# Patient Record
Sex: Female | Born: 1945
Health system: Southern US, Community
[De-identification: ages and names within clinical notes are randomized; demographics above are authoritative.]

## PROBLEM LIST (undated history)

## (undated) DIAGNOSIS — F1010 Alcohol abuse, uncomplicated: Secondary | ICD-10-CM

## (undated) DIAGNOSIS — K7460 Unspecified cirrhosis of liver: Secondary | ICD-10-CM

## (undated) DIAGNOSIS — H35039 Hypertensive retinopathy, unspecified eye: Secondary | ICD-10-CM

## (undated) DIAGNOSIS — K259 Gastric ulcer, unspecified as acute or chronic, without hemorrhage or perforation: Secondary | ICD-10-CM

## (undated) DIAGNOSIS — I739 Peripheral vascular disease, unspecified: Secondary | ICD-10-CM

## (undated) DIAGNOSIS — Z5189 Encounter for other specified aftercare: Secondary | ICD-10-CM

## (undated) DIAGNOSIS — R011 Cardiac murmur, unspecified: Secondary | ICD-10-CM

## (undated) DIAGNOSIS — I10 Essential (primary) hypertension: Secondary | ICD-10-CM

## (undated) HISTORY — DX: Encounter for other specified aftercare: Z51.89

## (undated) HISTORY — PX: CATARACT EXTRACTION: SUR2

## (undated) HISTORY — DX: Unspecified cirrhosis of liver: K74.60

## (undated) HISTORY — DX: Gastric ulcer, unspecified as acute or chronic, without hemorrhage or perforation: K25.9

## (undated) HISTORY — DX: Essential (primary) hypertension: I10

## (undated) HISTORY — DX: Alcohol abuse, uncomplicated: F10.10

## (undated) HISTORY — PX: EYE SURGERY: SHX253

## (undated) HISTORY — DX: Hypertensive retinopathy, unspecified eye: H35.039

## (undated) HISTORY — PX: ABDOMINAL HYSTERECTOMY: SHX81

## (undated) HISTORY — PX: OOPHORECTOMY: SHX86

## (undated) HISTORY — DX: Peripheral vascular disease, unspecified: I73.9

## (undated) HISTORY — DX: Cardiac murmur, unspecified: R01.1

## (undated) HISTORY — PX: BREAST LUMPECTOMY: SHX2

---

## 1969-01-26 HISTORY — PX: BREAST EXCISIONAL BIOPSY: SUR124

## 1999-02-24 ENCOUNTER — Encounter: Admission: RE | Admit: 1999-02-24 | Discharge: 1999-02-24 | Payer: Self-pay | Admitting: Internal Medicine

## 1999-03-25 ENCOUNTER — Encounter: Admission: RE | Admit: 1999-03-25 | Discharge: 1999-03-25 | Payer: Self-pay | Admitting: Internal Medicine

## 1999-04-23 ENCOUNTER — Ambulatory Visit (HOSPITAL_COMMUNITY): Admission: RE | Admit: 1999-04-23 | Discharge: 1999-04-23 | Payer: Self-pay | Admitting: Internal Medicine

## 2000-03-01 ENCOUNTER — Encounter: Payer: Self-pay | Admitting: Internal Medicine

## 2000-03-01 ENCOUNTER — Inpatient Hospital Stay (HOSPITAL_COMMUNITY): Admission: EM | Admit: 2000-03-01 | Discharge: 2000-03-03 | Payer: Self-pay | Admitting: Emergency Medicine

## 2000-03-01 ENCOUNTER — Encounter: Payer: Self-pay | Admitting: Emergency Medicine

## 2000-03-03 ENCOUNTER — Encounter: Payer: Self-pay | Admitting: Internal Medicine

## 2000-03-08 ENCOUNTER — Encounter: Admission: RE | Admit: 2000-03-08 | Discharge: 2000-03-08 | Payer: Self-pay | Admitting: Internal Medicine

## 2003-05-23 ENCOUNTER — Emergency Department (HOSPITAL_COMMUNITY): Admission: EM | Admit: 2003-05-23 | Discharge: 2003-05-23 | Payer: Self-pay | Admitting: Family Medicine

## 2003-11-13 ENCOUNTER — Ambulatory Visit: Payer: Self-pay | Admitting: Internal Medicine

## 2004-01-16 ENCOUNTER — Ambulatory Visit: Payer: Self-pay | Admitting: Internal Medicine

## 2012-02-10 ENCOUNTER — Emergency Department (HOSPITAL_COMMUNITY)
Admission: EM | Admit: 2012-02-10 | Discharge: 2012-02-10 | Disposition: A | Discharge status: Home / Self Care | Payer: Medicare Other | Attending: Emergency Medicine | Admitting: Emergency Medicine

## 2012-02-10 ENCOUNTER — Encounter (HOSPITAL_COMMUNITY): Payer: Self-pay | Admitting: Emergency Medicine

## 2012-02-10 DIAGNOSIS — I10 Essential (primary) hypertension: Secondary | ICD-10-CM | POA: Insufficient documentation

## 2012-02-10 DIAGNOSIS — F17200 Nicotine dependence, unspecified, uncomplicated: Secondary | ICD-10-CM | POA: Insufficient documentation

## 2012-02-10 DIAGNOSIS — Z79899 Other long term (current) drug therapy: Secondary | ICD-10-CM | POA: Insufficient documentation

## 2012-02-10 DIAGNOSIS — R040 Epistaxis: Secondary | ICD-10-CM | POA: Insufficient documentation

## 2012-02-10 NOTE — ED Notes (Signed)
Pt states that her nose has been bleeding off and on since last night.  States she has blood clots that have been coming out.  Moderately sized blood clots.  No hx of epistaxis.  Family member states, "it's been running like a faucet."

## 2012-02-10 NOTE — ED Provider Notes (Signed)
History     CSN: [id number]  Arrival date & time 02/10/12  1821   First MD Initiated Contact with Patient 02/10/12 1845      Chief Complaint  Patient presents with  . Epistaxis    (Consider location/radiation/quality/duration/timing/severity/associated sxs/prior treatment) Patient is a 67 y.o. female presenting with nosebleeds. The history is provided by the patient.  Epistaxis   She noted onset yesterday of intermittent right-sided nosebleeds. She denies any head trauma and she's not taking any anticoagulants. However, she does admit to using Goody powder frequently. She has not had previous problems with nosebleeds.  History reviewed. No pertinent past medical history.  Past Surgical History  Procedure Date  . Breast lumpectomy     History reviewed. No pertinent family history.  History  Substance Use Topics  . Smoking status: Current Every Day Smoker -- 0.5 packs/day    Types: Cigarettes  . Smokeless tobacco: Not on file  . Alcohol Use: Yes    OB History    Grav Para Term Preterm Abortions TAB SAB Ect Mult Living                  Review of Systems  HENT: Positive for nosebleeds.   All other systems reviewed and are negative.    Allergies  Review of patient's allergies indicates no known allergies.  Home Medications   Current Outpatient Rx  Name  Route  Sig  Dispense  Refill  . GOODY HEADACHE PO   Oral   Take 1 packet by mouth daily as needed. For pain.         . IBUPROFEN 200 MG PO TABS   Oral   Take 400 mg by mouth every 8 (eight) hours as needed. For pain.           BP 180/112  Pulse 71  SpO2 100%  Physical Exam  Nursing note and vitals reviewed.  67 year old female, resting comfortably and in no acute distress. Vital signs are significant for hypertension with blood pressure 180/112. Oxygen saturation is 100%, which is normal. Head is normocephalic and atraumatic. PERRLA, EOMI. Oropharynx is clear. Bleeding site is seen on the right  side of the nasal septum near the nare. Neck is nontender and supple without adenopathy or JVD. Back is nontender and there is no CVA tenderness. Lungs are clear without rales, wheezes, or rhonchi. Chest is nontender. Heart has regular rate and rhythm without murmur. Abdomen is soft, flat, nontender without masses or hepatosplenomegaly and peristalsis is normoactive. Extremities have no cyanosis or edema, full range of motion is present. Skin is warm and dry without rash. Neurologic: Mental status is normal, cranial nerves are intact, there are no motor or sensory deficits.  ED Course  EPISTAXIS MANAGEMENT Date/Time: 02/10/2012 6:57 PM Performed by: Dione Booze Authorized by: Preston Fleeting, Sandy Haye Consent: Verbal consent obtained. Written consent not obtained. Risks and benefits: risks, benefits and alternatives were discussed Consent given by: patient Patient understanding: patient states understanding of the procedure being performed Patient consent: the patient's understanding of the procedure matches consent given Procedure consent: procedure consent matches procedure scheduled Relevant documents: relevant documents present and verified Site marked: the operative site was marked Required items: required blood products, implants, devices, and special equipment available Patient identity confirmed: verbally with patient and arm band Time out: Immediately prior to procedure a "time out" was called to verify the correct patient, procedure, equipment, support staff and site/side marked as required. Patient sedated: no Treatment site: right anterior  Repair method: 5.5 cm Rapid Rhino. Post-procedure assessment: bleeding stopped Treatment complexity: simple Patient tolerance: Patient tolerated the procedure well with no immediate complications.   (including critical care time)    1. Anterior epistaxis   2. Hypertension       MDM  Epistaxis secondary to combination of aspirin use and  hypertension. After placement of Rapid Rhino, she was observed for approximately 90 minutes with no recurrence of bleeding. She'll be discharged and is instructed to have the Rapid Rhino removed in 2 days. She is to monitor her blood pressure as an outpatient and institute treatment if it stays persistently elevated.        Dione Booze, MD 02/10/12 2007

## 2012-02-10 NOTE — ED Notes (Signed)
Family at bedside. 

## 2012-02-12 ENCOUNTER — Emergency Department (HOSPITAL_COMMUNITY)
Admission: EM | Admit: 2012-02-12 | Discharge: 2012-02-12 | Disposition: A | Discharge status: Home / Self Care | Payer: Medicare Other | Attending: Emergency Medicine | Admitting: Emergency Medicine

## 2012-02-12 ENCOUNTER — Encounter (HOSPITAL_COMMUNITY): Payer: Self-pay | Admitting: *Deleted

## 2012-02-12 ENCOUNTER — Encounter (HOSPITAL_COMMUNITY): Payer: Self-pay

## 2012-02-12 ENCOUNTER — Emergency Department (INDEPENDENT_AMBULATORY_CARE_PROVIDER_SITE_OTHER)
Admission: EM | Admit: 2012-02-12 | Discharge: 2012-02-12 | Disposition: A | Discharge status: Nursing Home (Includes ICF) | Payer: Medicare Other | Source: Home / Self Care | Attending: Family Medicine | Admitting: Family Medicine

## 2012-02-12 DIAGNOSIS — F411 Generalized anxiety disorder: Secondary | ICD-10-CM | POA: Insufficient documentation

## 2012-02-12 DIAGNOSIS — I10 Essential (primary) hypertension: Secondary | ICD-10-CM | POA: Insufficient documentation

## 2012-02-12 DIAGNOSIS — R040 Epistaxis: Secondary | ICD-10-CM

## 2012-02-12 DIAGNOSIS — F17200 Nicotine dependence, unspecified, uncomplicated: Secondary | ICD-10-CM | POA: Insufficient documentation

## 2012-02-12 MED ORDER — CLONIDINE HCL 0.1 MG PO TABS
ORAL_TABLET | ORAL | Status: AC
  Filled 2012-02-12: qty 2

## 2012-02-12 MED ORDER — CLONIDINE HCL 0.1 MG PO TABS
0.2000 mg | ORAL_TABLET | Freq: Once | ORAL | Status: AC
  Administered 2012-02-12: 0.2 mg via ORAL

## 2012-02-12 MED ORDER — OXYMETAZOLINE HCL 0.05 % NA SOLN
1.0000 | Freq: Once | NASAL | Status: AC
  Administered 2012-02-12: 1 via NASAL
  Filled 2012-02-12: qty 15

## 2012-02-12 MED ORDER — CLONIDINE HCL 0.1 MG PO TABS: 0.2000 mg | ORAL_TABLET | Freq: Once | ORAL | Status: DC

## 2012-02-12 NOTE — ED Provider Notes (Signed)
History     CSN: [id number]  Arrival date & time 02/12/12  1402   First MD Initiated Contact with Patient 02/12/12 1421      Chief Complaint  Patient presents with  . Epistaxis    (Consider location/radiation/quality/duration/timing/severity/associated sxs/prior treatment) Patient is a 67 y.o. female presenting with nosebleeds. The history is provided by the patient and a relative.  Epistaxis  This is a new problem. Episode onset: onset on1/14 ,right nares bleeding, [facility] and bleeding, rhino rocket placed, no bp attention., here for re-eval. The problem has been gradually improving. The bleeding has been from the right nare. She has tried a nasal tampon for the symptoms. The treatment provided significant relief.    History reviewed. No pertinent past medical history.  Past Surgical History  Procedure Date  . Breast lumpectomy     Family History  Problem Relation Age of Onset  . Cancer Mother   . Heart attack Father     History  Substance Use Topics  . Smoking status: Current Every Day Smoker -- 0.2 packs/day    Types: Cigarettes  . Smokeless tobacco: Not on file  . Alcohol Use: 1.8 oz/week    3 Shots of liquor per week    OB History    Grav Para Term Preterm Abortions TAB SAB Ect Mult Living                  Review of Systems  Constitutional: Negative.   HENT: Positive for nosebleeds. Negative for congestion, rhinorrhea, postnasal drip and ear discharge.     Allergies  Review of patient's allergies indicates no known allergies.  Home Medications   Current Outpatient Rx  Name  Route  Sig  Dispense  Refill  . IBUPROFEN 200 MG PO TABS   Oral   Take 400 mg by mouth every 8 (eight) hours as needed. For pain.           BP 177/100  Pulse 62  Temp 99.2 F (37.3 C) (Oral)  Resp 20  SpO2 98%  Physical Exam  Nursing note and vitals reviewed. Constitutional: She is oriented to person, place, and time. She appears well-developed  and well-nourished.  HENT:  Head: Normocephalic.  Right Ear: External ear normal.  Left Ear: External ear normal.  Nose: Epistaxis is observed.    Mouth/Throat: Oropharynx is clear and moist.  Eyes: Conjunctivae normal are normal. Pupils are equal, round, and reactive to light.  Neck: Normal range of motion. Neck supple.  Cardiovascular: Normal rate, regular rhythm and normal heart sounds.   Musculoskeletal: She exhibits no edema.  Neurological: She is alert and oriented to person, place, and time.  Skin: Skin is warm and dry.    ED Course  Procedures (including critical care time)  Labs Reviewed - No data to display No results found.   1. Right-sided epistaxis   2. Hypertension complications       MDM          Linna Hoff, MD 02/13/12 1733

## 2012-02-12 NOTE — ED Notes (Signed)
Pt. Reports nose bleeds, was seen at [facility] and had nose packed. Went to urgent care today and had packing removed, urgent care gave her "some medicine" for BP and sent her here for eval of high BP.

## 2012-02-12 NOTE — ED Notes (Signed)
Pt. Had a nose bleed this Wednesday,  Went to [facility] had rt. Nares packed.  It was removed today at out urgent care. No active bleeding noted. Pt. Was sent to [place] from urgent care due to her BP being elevated.  Presently her BP is 146/88

## 2012-02-12 NOTE — ED Notes (Addendum)
Pt. had nasal balloon inserted at [facility] ED on 1/15.  No further bleeding but c/o discomfort. Pt.'s BP elevated today and on  1/15.  No hx. HTN.  Was not started on any BP medication.

## 2012-02-12 NOTE — ED Provider Notes (Signed)
History     CSN: [id number]  Arrival date & time 02/12/12  1647   First MD Initiated Contact with Patient 02/12/12 2018      Chief Complaint  Patient presents with  . Hypertension    (Consider location/radiation/quality/duration/timing/severity/associated sxs/prior treatment) HPI  Pt to the ED sent by [facility] for elevated BP. Her BP at discharge from the Urgent Care was 177/100. She  went to the Urgent Care to have her nasal packaging removed. She has been seen in the ED for nose  bleed on the 15th  And was instructed to have it taken out. She is completely asymptomatic. She  admits to being very anxious about having the packing place because it hurt so much to put in. She  denies having a history of high blood pressure and says she is over all in good health. No chest pain,  no SOB, no abdominal pains or change in vision. nad vss   History reviewed. No pertinent past medical history.  Past Surgical History  Procedure Date  . Breast lumpectomy     Family History  Problem Relation Age of Onset  . Cancer Mother   . Heart attack Father     History  Substance Use Topics  . Smoking status: Current Every Day Smoker -- 0.2 packs/day    Types: Cigarettes  . Smokeless tobacco: Not on file  . Alcohol Use: 1.8 oz/week    3 Shots of liquor per week    OB History    Grav Para Term Preterm Abortions TAB SAB Ect Mult Living                  Review of Systems   Review of Systems  Gen: no weight loss, fevers, chills, night sweats  Eyes: no discharge or drainage, no occular pain or visual changes  Nose: no epistaxis or rhinorrhea  Mouth: no dental pain, no sore throat  Neck: no neck pain  Lungs:No wheezing, coughing or hemoptysis CV: no chest pain, palpitations, dependent edema or orthopnea, +hypertension  Abd: no abdominal pain, nausea, vomiting  GU: no dysuria or gross hematuria  MSK:  No abnormalities  Neuro: no headache, no focal neurologic deficits  Skin: no  abnormalities Psyche: negative.    Allergies  Review of patient's allergies indicates no known allergies.  Home Medications   Current Outpatient Rx  Name  Route  Sig  Dispense  Refill  . IBUPROFEN 200 MG PO TABS   Oral   Take 400 mg by mouth every 8 (eight) hours as needed. For pain.           BP 135/85  Pulse 51  Temp 97.1 F (36.2 C) (Oral)  Resp 18  SpO2 98%  Physical Exam  Nursing note and vitals reviewed. Constitutional: She appears well-developed and well-nourished. No distress.  HENT:  Head: Normocephalic and atraumatic.  Eyes: Pupils are equal, round, and reactive to light.  Neck: Normal range of motion. Neck supple.  Cardiovascular: Normal rate and regular rhythm.   Pulmonary/Chest: Effort normal.  Abdominal: Soft.  Neurological: She is alert.  Skin: Skin is warm and dry.    ED Course  Procedures (including critical care time)  Labs Reviewed - No data to display No results found.   1. Epistaxis   2. Hypertension       MDM  Discussed patient with Dr. Ignacia Palma. Pt is completely asymptomatic and BP in triage is 146/88 and throughout stay stayed stable.  No indication to start  therapy at this time. No work-up needed as patient is asymptomatic.   Pt asked to take her BP twice a day and keep record of them to show her PCP during a followup appt she is to schedule in the next couple of days.  Pt has been advised of the symptoms that warrant their return to the ED. Patient has voiced understanding and has agreed to follow-up with the PCP or specialist.        Kylie Matas, PA 02/12/12 2316

## 2012-02-13 NOTE — ED Provider Notes (Signed)
Medical screening examination/treatment/procedure(s) were performed by non-physician practitioner and as supervising physician I was immediately available for consultation/collaboration.   Carleene Cooper III, MD 02/13/12 [phone]

## 2015-04-16 ENCOUNTER — Inpatient Hospital Stay (HOSPITAL_COMMUNITY)
Admission: EM | Admit: 2015-04-16 | Discharge: 2015-04-19 | DRG: 378 | Disposition: A | Discharge status: Home Health | Payer: Medicare Other | Attending: Internal Medicine | Admitting: Internal Medicine

## 2015-04-16 ENCOUNTER — Encounter (HOSPITAL_COMMUNITY): Payer: Self-pay | Admitting: Emergency Medicine

## 2015-04-16 ENCOUNTER — Inpatient Hospital Stay (HOSPITAL_COMMUNITY): Payer: Medicare Other

## 2015-04-16 DIAGNOSIS — F10239 Alcohol dependence with withdrawal, unspecified: Secondary | ICD-10-CM | POA: Diagnosis present

## 2015-04-16 DIAGNOSIS — Z8249 Family history of ischemic heart disease and other diseases of the circulatory system: Secondary | ICD-10-CM | POA: Diagnosis not present

## 2015-04-16 DIAGNOSIS — K269 Duodenal ulcer, unspecified as acute or chronic, without hemorrhage or perforation: Secondary | ICD-10-CM | POA: Diagnosis not present

## 2015-04-16 DIAGNOSIS — R001 Bradycardia, unspecified: Secondary | ICD-10-CM | POA: Diagnosis not present

## 2015-04-16 DIAGNOSIS — Z7289 Other problems related to lifestyle: Secondary | ICD-10-CM | POA: Diagnosis present

## 2015-04-16 DIAGNOSIS — E860 Dehydration: Secondary | ICD-10-CM | POA: Diagnosis not present

## 2015-04-16 DIAGNOSIS — K920 Hematemesis: Secondary | ICD-10-CM | POA: Diagnosis present

## 2015-04-16 DIAGNOSIS — I119 Hypertensive heart disease without heart failure: Secondary | ICD-10-CM | POA: Diagnosis present

## 2015-04-16 DIAGNOSIS — D62 Acute posthemorrhagic anemia: Secondary | ICD-10-CM

## 2015-04-16 DIAGNOSIS — Z789 Other specified health status: Secondary | ICD-10-CM | POA: Diagnosis present

## 2015-04-16 DIAGNOSIS — K2960 Other gastritis without bleeding: Secondary | ICD-10-CM | POA: Diagnosis present

## 2015-04-16 DIAGNOSIS — K921 Melena: Secondary | ICD-10-CM | POA: Diagnosis not present

## 2015-04-16 DIAGNOSIS — I959 Hypotension, unspecified: Secondary | ICD-10-CM | POA: Diagnosis not present

## 2015-04-16 DIAGNOSIS — I471 Supraventricular tachycardia: Secondary | ICD-10-CM | POA: Diagnosis not present

## 2015-04-16 DIAGNOSIS — K250 Acute gastric ulcer with hemorrhage: Secondary | ICD-10-CM | POA: Diagnosis not present

## 2015-04-16 DIAGNOSIS — R300 Dysuria: Secondary | ICD-10-CM | POA: Diagnosis not present

## 2015-04-16 DIAGNOSIS — F17210 Nicotine dependence, cigarettes, uncomplicated: Secondary | ICD-10-CM | POA: Diagnosis present

## 2015-04-16 DIAGNOSIS — D696 Thrombocytopenia, unspecified: Secondary | ICD-10-CM | POA: Diagnosis not present

## 2015-04-16 DIAGNOSIS — E876 Hypokalemia: Secondary | ICD-10-CM | POA: Diagnosis present

## 2015-04-16 DIAGNOSIS — K2950 Unspecified chronic gastritis without bleeding: Secondary | ICD-10-CM | POA: Diagnosis not present

## 2015-04-16 DIAGNOSIS — Z6820 Body mass index (BMI) 20.0-20.9, adult: Secondary | ICD-10-CM

## 2015-04-16 DIAGNOSIS — R7989 Other specified abnormal findings of blood chemistry: Secondary | ICD-10-CM | POA: Diagnosis present

## 2015-04-16 DIAGNOSIS — Z809 Family history of malignant neoplasm, unspecified: Secondary | ICD-10-CM | POA: Diagnosis not present

## 2015-04-16 DIAGNOSIS — K7460 Unspecified cirrhosis of liver: Secondary | ICD-10-CM | POA: Diagnosis not present

## 2015-04-16 DIAGNOSIS — R42 Dizziness and giddiness: Secondary | ICD-10-CM | POA: Diagnosis not present

## 2015-04-16 DIAGNOSIS — K253 Acute gastric ulcer without hemorrhage or perforation: Secondary | ICD-10-CM | POA: Diagnosis not present

## 2015-04-16 DIAGNOSIS — K226 Gastro-esophageal laceration-hemorrhage syndrome: Secondary | ICD-10-CM | POA: Diagnosis not present

## 2015-04-16 DIAGNOSIS — K2961 Other gastritis with bleeding: Secondary | ICD-10-CM | POA: Diagnosis not present

## 2015-04-16 DIAGNOSIS — R509 Fever, unspecified: Secondary | ICD-10-CM | POA: Diagnosis not present

## 2015-04-16 DIAGNOSIS — K922 Gastrointestinal hemorrhage, unspecified: Secondary | ICD-10-CM | POA: Diagnosis not present

## 2015-04-16 DIAGNOSIS — E440 Moderate protein-calorie malnutrition: Secondary | ICD-10-CM | POA: Diagnosis not present

## 2015-04-16 DIAGNOSIS — K2970 Gastritis, unspecified, without bleeding: Secondary | ICD-10-CM | POA: Diagnosis not present

## 2015-04-16 DIAGNOSIS — F1090 Alcohol use, unspecified, uncomplicated: Secondary | ICD-10-CM | POA: Diagnosis present

## 2015-04-16 DIAGNOSIS — D649 Anemia, unspecified: Secondary | ICD-10-CM | POA: Diagnosis not present

## 2015-04-16 DIAGNOSIS — R1013 Epigastric pain: Secondary | ICD-10-CM

## 2015-04-16 DIAGNOSIS — Z79899 Other long term (current) drug therapy: Secondary | ICD-10-CM

## 2015-04-16 DIAGNOSIS — I10 Essential (primary) hypertension: Secondary | ICD-10-CM

## 2015-04-16 DIAGNOSIS — K254 Chronic or unspecified gastric ulcer with hemorrhage: Secondary | ICD-10-CM | POA: Insufficient documentation

## 2015-04-16 DIAGNOSIS — R1111 Vomiting without nausea: Secondary | ICD-10-CM | POA: Diagnosis not present

## 2015-04-16 LAB — COMPREHENSIVE METABOLIC PANEL
ALT: 20 U/L (ref 14–54)
AST: 50 U/L — ABNORMAL HIGH (ref 15–41)
Albumin: 3.4 g/dL — ABNORMAL LOW (ref 3.5–5.0)
Alkaline Phosphatase: 60 U/L (ref 38–126)
Anion gap: 13 (ref 5–15)
BUN: 30 mg/dL — ABNORMAL HIGH (ref 6–20)
CO2: 29 mmol/L (ref 22–32)
Calcium: 7.7 mg/dL — ABNORMAL LOW (ref 8.9–10.3)
Chloride: 100 mmol/L — ABNORMAL LOW (ref 101–111)
Creatinine, Ser: 1.01 mg/dL — ABNORMAL HIGH (ref 0.44–1.00)
GFR calc Af Amer: >60 mL/min (ref >60)
GFR calc non Af Amer: 56 mL/min — ABNORMAL LOW (ref >60)
Glucose, Bld: 130 mg/dL — ABNORMAL HIGH (ref 65–99)
Potassium: 2.7 mmol/L — CL (ref 3.5–5.1)
Sodium: 142 mmol/L (ref 135–145)
Total Bilirubin: 2.9 mg/dL — ABNORMAL HIGH (ref 0.3–1.2)
Total Protein: 5.8 g/dL — ABNORMAL LOW (ref 6.5–8.1)

## 2015-04-16 LAB — CBC WITH DIFFERENTIAL/PLATELET
Basophils Absolute: 0 10*3/uL (ref 0.0–0.1)
Basophils Relative: 0 %
Eosinophils Absolute: 0 10*3/uL (ref 0.0–0.7)
Eosinophils Relative: 0 %
HCT: 21.7 % — ABNORMAL LOW (ref 36.0–46.0)
Hemoglobin: 7.3 g/dL — ABNORMAL LOW (ref 12.0–15.0)
Lymphocytes Relative: 14 %
Lymphs Abs: 1 10*3/uL (ref 0.7–4.0)
MCH: 33.6 pg (ref 26.0–34.0)
MCHC: 33.6 g/dL (ref 30.0–36.0)
MCV: 100 fL (ref 78.0–100.0)
Monocytes Absolute: 0.7 10*3/uL (ref 0.1–1.0)
Monocytes Relative: 10 %
Neutro Abs: 5.7 10*3/uL (ref 1.7–7.7)
Neutrophils Relative %: 76 %
Platelets: 64 10*3/uL — ABNORMAL LOW (ref 150–400)
RBC: 2.17 MIL/uL — ABNORMAL LOW (ref 3.87–5.11)
RDW: 22.3 % — ABNORMAL HIGH (ref 11.5–15.5)
WBC: 7.4 10*3/uL (ref 4.0–10.5)

## 2015-04-16 LAB — PROTIME-INR
INR: 1.16 (ref 0.00–1.49)
Prothrombin Time: 15 seconds (ref 11.6–15.2)

## 2015-04-16 LAB — MAGNESIUM: Magnesium: 1 mg/dL — ABNORMAL LOW (ref 1.7–2.4)

## 2015-04-16 LAB — ABO/RH: ABO/RH(D): A POS

## 2015-04-16 LAB — PREPARE RBC (CROSSMATCH)

## 2015-04-16 MED ORDER — ONDANSETRON HCL 4 MG/2ML IJ SOLN
4.0000 mg | Freq: Four times a day (QID) | INTRAMUSCULAR | Status: DC
  Administered 2015-04-16 – 2015-04-19 (×9): 4 mg via INTRAVENOUS
  Filled 2015-04-16 (×9): qty 2

## 2015-04-16 MED ORDER — SODIUM CHLORIDE 0.9 % IV BOLUS (SEPSIS)
2000.0000 mL | Freq: Once | INTRAVENOUS | Status: AC
  Administered 2015-04-16: 2000 mL via INTRAVENOUS

## 2015-04-16 MED ORDER — ONDANSETRON HCL 4 MG/2ML IJ SOLN
4.0000 mg | Freq: Once | INTRAMUSCULAR | Status: AC
  Administered 2015-04-16: 4 mg via INTRAVENOUS
  Filled 2015-04-16: qty 2

## 2015-04-16 MED ORDER — SODIUM CHLORIDE 0.9 % IV SOLN
8.0000 mg/h | INTRAVENOUS | Status: DC
  Administered 2015-04-16 – 2015-04-17 (×2): 8 mg/h via INTRAVENOUS
  Filled 2015-04-16 (×4): qty 80

## 2015-04-16 MED ORDER — POTASSIUM CHLORIDE CRYS ER 20 MEQ PO TBCR
40.0000 meq | EXTENDED_RELEASE_TABLET | Freq: Once | ORAL | Status: AC
  Administered 2015-04-16: 40 meq via ORAL
  Filled 2015-04-16: qty 2

## 2015-04-16 MED ORDER — ACETAMINOPHEN 325 MG PO TABS
650.0000 mg | ORAL_TABLET | Freq: Four times a day (QID) | ORAL | Status: DC | PRN
  Administered 2015-04-16 – 2015-04-17 (×2): 650 mg via ORAL
  Filled 2015-04-16 (×2): qty 2

## 2015-04-16 MED ORDER — SODIUM CHLORIDE 0.9% FLUSH
3.0000 mL | Freq: Two times a day (BID) | INTRAVENOUS | Status: DC
  Administered 2015-04-17 – 2015-04-19 (×4): 3 mL via INTRAVENOUS

## 2015-04-16 MED ORDER — LORAZEPAM 2 MG/ML IJ SOLN
2.0000 mg | INTRAMUSCULAR | Status: DC | PRN
  Administered 2015-04-17: 2 mg via INTRAVENOUS
  Filled 2015-04-16: qty 1

## 2015-04-16 MED ORDER — POTASSIUM CHLORIDE 10 MEQ/100ML IV SOLN
10.0000 meq | INTRAVENOUS | Status: AC
  Administered 2015-04-16 (×4): 10 meq via INTRAVENOUS
  Filled 2015-04-16 (×5): qty 100

## 2015-04-16 MED ORDER — SODIUM CHLORIDE 0.9 % IV SOLN
50.0000 ug/h | INTRAVENOUS | Status: DC
  Administered 2015-04-16 – 2015-04-17 (×2): 50 ug/h via INTRAVENOUS
  Filled 2015-04-16 (×4): qty 1

## 2015-04-16 MED ORDER — ONDANSETRON HCL 4 MG PO TABS: 4.0000 mg | ORAL_TABLET | Freq: Four times a day (QID) | ORAL | Status: DC | PRN

## 2015-04-16 MED ORDER — VITAMIN B-1 100 MG PO TABS
100.0000 mg | ORAL_TABLET | Freq: Every day | ORAL | Status: DC
  Administered 2015-04-16 – 2015-04-19 (×3): 100 mg via ORAL
  Filled 2015-04-16 (×4): qty 1

## 2015-04-16 MED ORDER — PANTOPRAZOLE SODIUM 40 MG IV SOLR
80.0000 mg | Freq: Once | INTRAVENOUS | Status: AC
  Administered 2015-04-16: 80 mg via INTRAVENOUS
  Filled 2015-04-16: qty 80

## 2015-04-16 MED ORDER — ONDANSETRON HCL 4 MG PO TABS
4.0000 mg | ORAL_TABLET | Freq: Four times a day (QID) | ORAL | Status: DC
  Administered 2015-04-18 – 2015-04-19 (×2): 4 mg via ORAL
  Filled 2015-04-16 (×9): qty 1

## 2015-04-16 MED ORDER — ONDANSETRON HCL 4 MG/2ML IJ SOLN: 4.0000 mg | Freq: Four times a day (QID) | INTRAMUSCULAR | Status: DC | PRN

## 2015-04-16 MED ORDER — SODIUM CHLORIDE 0.9 % IV SOLN
INTRAVENOUS | Status: DC
  Administered 2015-04-16: 18:00:00 via INTRAVENOUS

## 2015-04-16 MED ORDER — FOLIC ACID 1 MG PO TABS
1.0000 mg | ORAL_TABLET | Freq: Every day | ORAL | Status: DC
  Administered 2015-04-16 – 2015-04-19 (×3): 1 mg via ORAL
  Filled 2015-04-16 (×4): qty 1

## 2015-04-16 MED ORDER — SODIUM CHLORIDE 0.9 % IV SOLN
10.0000 mL/h | Freq: Once | INTRAVENOUS | Status: AC
  Administered 2015-04-16: 10 mL/h via INTRAVENOUS

## 2015-04-16 MED ORDER — ADULT MULTIVITAMIN W/MINERALS CH
1.0000 | ORAL_TABLET | Freq: Every day | ORAL | Status: DC
  Administered 2015-04-16 – 2015-04-19 (×3): 1 via ORAL
  Filled 2015-04-16 (×4): qty 1

## 2015-04-16 MED ORDER — ACETAMINOPHEN 650 MG RE SUPP: 650.0000 mg | Freq: Four times a day (QID) | RECTAL | Status: DC | PRN

## 2015-04-16 NOTE — ED Notes (Signed)
Bed: [facility] Expected date:  Expected time:  Means of arrival:  Comments: EMS-coughing up blood

## 2015-04-16 NOTE — ED Provider Notes (Signed)
CSN: [id number]     Arrival date & time 04/16/15  1414 History   First MD Initiated Contact with Patient 04/16/15 1456     Chief Complaint  Patient presents with  . Hematemesis     (Consider location/radiation/quality/duration/timing/severity/associated sxs/prior Treatment) HPI   70yF with hematemesis and dark stools. Symptom onset about 3 days ago. Describes dark, coffee ground emesis. Very dark bowel movement 3 days ago. Has had subsequent BM which appeared more brown in color. In last day had felt lightheaded with standing and exertion. Dull periumbilical pain. Hx of ETOH abuse. Denies known hx of cirrhosis, varices or prior GIB. Denies hx of PUD. Occasionally takes ibuprofen for aches/pains. She estimates takes 1x/week on average. No blood thinners. Never had EGD/colonoscopy. No PCP or gastroenterologist.   History reviewed. No pertinent past medical history. Past Surgical History  Procedure Laterality Date  . Breast lumpectomy     Family History  Problem Relation Age of Onset  . Cancer Mother   . Heart attack Father    Social History  Substance Use Topics  . Smoking status: Current Every Day Smoker -- 0.25 packs/day    Types: Cigarettes  . Smokeless tobacco: None  . Alcohol Use: 1.8 oz/week    3 Shots of liquor per week   OB History    No data available     Review of Systems  All systems reviewed and negative, other than as noted in HPI.   Allergies  Review of patient's allergies indicates no known allergies.  Home Medications   Prior to Admission medications   Medication Sig Start Date End Date Taking? Authorizing Provider  ibuprofen (ADVIL,MOTRIN) 200 MG tablet Take 400 mg by mouth every 8 (eight) hours as needed. For pain.    Historical Provider, MD   BP 75/62 mmHg  Pulse 109  Temp(Src) 98.5 F (36.9 C) (Oral)  Resp 22  SpO2 98% Physical Exam  Constitutional: She appears well-developed and well-nourished. No distress.  HENT:  Head: Normocephalic and  atraumatic.  Eyes: Conjunctivae are normal. Right eye exhibits no discharge. Left eye exhibits no discharge.  Neck: Neck supple.  Cardiovascular: Normal rate, regular rhythm and normal heart sounds.  Exam reveals no gallop and no friction rub.   No murmur heard. Pulmonary/Chest: Effort normal and breath sounds normal. No respiratory distress.  Abdominal: Soft. She exhibits no distension. There is tenderness.  Mild epigastric tenderness w/o rebound or guarding  Musculoskeletal: She exhibits no edema or tenderness.  Neurological: She is alert.  Skin: Skin is warm and dry.  Psychiatric: She has a normal mood and affect. Her behavior is normal. Thought content normal.  Nursing note and vitals reviewed.   ED Course  Procedures (including critical care time)  CRITICAL CARE Performed by: Virgel Manifold Total critical care time: 35 minutes Critical care time was exclusive of separately billable procedures and treating other patients. Critical care was necessary to treat or prevent imminent or life-threatening deterioration. Critical care was time spent personally by me on the following activities: development of treatment plan with patient and/or surrogate as well as nursing, discussions with consultants, evaluation of patient's response to treatment, examination of patient, obtaining history from patient or surrogate, ordering and performing treatments and interventions, ordering and review of laboratory studies, ordering and review of radiographic studies, pulse oximetry and re-evaluation of patient's condition.  Labs Review Labs Reviewed  COMPREHENSIVE METABOLIC PANEL - Abnormal; Notable for the following:    Potassium 2.7 (*)    Chloride 100 (*)  Glucose, Bld 130 (*)    BUN 30 (*)    Creatinine, Ser 1.01 (*)    Calcium 7.7 (*)    Total Protein 5.8 (*)    Albumin 3.4 (*)    AST 50 (*)    Total Bilirubin 2.9 (*)    GFR calc non Af Amer 56 (*)    All other components within normal  limits  CBC WITH DIFFERENTIAL/PLATELET - Abnormal; Notable for the following:    RBC 2.17 (*)    Hemoglobin 7.3 (*)    HCT 21.7 (*)    RDW 22.3 (*)    Platelets 64 (*)    All other components within normal limits  PROTIME-INR  OCCULT BLOOD X 1 CARD TO LAB, STOOL  MAGNESIUM  TYPE AND SCREEN  PREPARE RBC (CROSSMATCH)  ABO/RH    Imaging Review No results found. I have personally reviewed and evaluated these images and lab results as part of my medical decision-making.   EKG Interpretation   Date/Time:  Tuesday April 16 2015 16:08:33 EDT Ventricular Rate:  108 PR Interval:  123 QRS Duration: 72 QT Interval:  357 QTC Calculation: 478 R Axis:   55 Text Interpretation:  Sinus tachycardia Multiple premature complexes, vent  & supraven LVH with secondary repolarization abnormality Anterior Q waves,  possibly due to LVH No old tracing to compare Confirmed by [place]  MD,  Alizabeth Antonio ([id number]) on 04/16/2015 4:15:14 PM      MDM   Final diagnoses:  UGIB (upper gastrointestinal bleed)  Symptomatic anemia  Hypokalemia    70yF with likely UGIB. Hx of ETOH abuse. Denies BRB. No known varices. Presented tachycardic and hypotensive. Feels ok at rest by lightheaded with standing/exertion. Needs additional IV access.  Bolus IVF and reassess BP. PPI bolus/gtt. Type/screen. Transfuse PRBCs PRN. Will discuss with GI. Likely medicine admission.   Hemoglobin 7.3.  Unclear baseline, but tachycardic/hypotensive/symptomatic. Will transfuse.   Hypokalemic. Will check Mag/EKG. Supplement.   Discussed with Edward Jolly, [facility] and Dr Posey Pronto, hospitalist, for admission. Appreciate the assistance.     Virgel Manifold, MD 04/16/15 [phone]

## 2015-04-16 NOTE — ED Notes (Signed)
Di Kindle, RN made aware of patient's potassium level

## 2015-04-16 NOTE — Consult Note (Signed)
Consultation  Referring Provider: ED MD - Dirk Dress Primary Care Physician:  Default, Provider, MD Primary Gastroenterologist:  None/unassigned  Reason for Consultation:  GI bleed/anemia  HPI: Kylie Torres is a 70 y.o. female who is admitted through the ER this afternoon after presenting with complaints of weakness dizziness, melena and vomiting of coffee-ground-appearing material. Patient is unassigned and has no PCP. She says she has not had any prior history of GI bleeding. She says she had onset about 3 days ago with black stools and then intermittent maturing of black material. Early today apparently her stool looked more normal but she vomited fairly large amount of black material. She had increased weakness and dizziness and came to the emergency room. She denies syncope. No complaints of abdominal pain, no dysphagia heartburn indigestion or recent nausea. She does take occasional ibuprofen though no more than a couple per week no regular aspirin BCs, Goody's, etc. She has been drinking alcohol on a daily basis for the past 20 years and states she usually drinks 2-3 glasses of gin per day. She does not have known liver disease and has not had DTs though has not had any recent attempts to stop alcohol use. Patient was hypotensive on arrival blood pressure 67/56 pulse 110 she has responded to fluid boluses with blood pressure systolic now [id number]. Labs show hemoglobin 7.3 platelets 64 INR is normal ,potassium 2.7,  total bili 2.9 ,alkaline phosphatase 60, AST of 50 and ALT of 20   History reviewed. No pertinent past medical history.  Past Surgical History  Procedure Laterality Date  . Breast lumpectomy      Prior to Admission medications   Medication Sig Start Date End Date Taking? Authorizing Provider  ibuprofen (ADVIL,MOTRIN) 200 MG tablet Take 400 mg by mouth every 8 (eight) hours as needed. For pain.   Yes Historical Provider, MD  Tetrahydrozoline HCl (EYE DROPS OP) Place 1 drop into both  eyes daily. In the evening   Yes Historical Provider, MD    Current Facility-Administered Medications  Medication Dose Route Frequency Provider Last Rate Last Dose  . 0.9 %  sodium chloride infusion  10 mL/hr Intravenous Once Virgel Manifold, MD      . pantoprazole (PROTONIX) 80 mg in sodium chloride 0.9 % 250 mL (0.32 mg/mL) infusion  8 mg/hr Intravenous Continuous Virgel Manifold, MD 25 mL/hr at 04/16/15 1533 8 mg/hr at 04/16/15 1533  . potassium chloride 10 mEq in 100 mL IVPB  10 mEq Intravenous Q1 Hr x 4 Virgel Manifold, MD      . potassium chloride SA (K-DUR,KLOR-CON) CR tablet 40 mEq  40 mEq Oral Once Virgel Manifold, MD       Current Outpatient Prescriptions  Medication Sig Dispense Refill  . ibuprofen (ADVIL,MOTRIN) 200 MG tablet Take 400 mg by mouth every 8 (eight) hours as needed. For pain.    . Tetrahydrozoline HCl (EYE DROPS OP) Place 1 drop into both eyes daily. In the evening      Allergies as of 04/16/2015  . (No Known Allergies)    Family History  Problem Relation Age of Onset  . Cancer Mother   . Heart attack Father     Social History   Social History  . Marital Status: Divorced    Spouse Name: N/A  . Number of Children: N/A  . Years of Education: N/A   Occupational History  . Not on file.   Social History Main Topics  . Smoking status: Current Every Day Smoker --  0.25 packs/day    Types: Cigarettes  . Smokeless tobacco: Not on file  . Alcohol Use: 1.8 oz/week    3 Shots of liquor per week  . Drug Use: No  . Sexual Activity: Not on file   Other Topics Concern  . Not on file   Social History Narrative    Review of Systems: Pertinent positive and negative review of systems were noted in the above HPI section.  All other review of systems was otherwise negative.  Physical Exam: Vital signs in last 24 hours: Temp:  [98.2 F (36.8 C)-98.5 F (36.9 C)] 98.5 F (36.9 C) (03/21 1452) Pulse Rate:  [96-111] 99 (03/21 1530) Resp:  [18-26] 21 (03/21  1530) BP: (67-93)/(56-70) 90/70 mmHg (03/21 1530) SpO2:  [98 %-99 %] 98 % (03/21 1530)   General:   Alert,  Well-developed,  Older AA female , pleasant and cooperative in NAD Head:  Normocephalic and atraumatic. Eyes:  Sclera clear, no icterus.   Conjunctiva pale. Ears:  Normal auditory acuity. Nose:  No deformity, discharge,  or lesions. Mouth:  No deformity or lesions.   Neck:  Supple; no masses or thyromegaly. Lungs:  Clear throughout to auscultation.   No wheezes, crackles, or rhonchi.  Heart:  Regular rate and rhythm; soft systolic murmur, . Abdomen:  Soft, nontender, BS active,nonpalp mass or hsm.   Rectal:  Deferred  Msk:  Symmetrical without gross deformities. . Pulses:  Normal pulses noted. Extremities:  Without clubbing or edema. Neurologic:  Alert and  oriented x4;  grossly normal neurologically. Skin:  Intact without significant lesions or rashes.. Psych:  Alert and cooperative. Normal mood and affect.  Intake/Output from previous day:   Intake/Output this shift:    Lab Results:  Recent Labs  04/16/15 1512  WBC 7.4  HGB 7.3*  HCT 21.7*  PLT 64*   BMET  Recent Labs  04/16/15 1512  NA 142  K 2.7*  CL 100*  CO2 29  GLUCOSE 130*  BUN 30*  CREATININE 1.01*  CALCIUM 7.7*   LFT  Recent Labs  04/16/15 1512  PROT 5.8*  ALBUMIN 3.4*  AST 50*  ALT 20  ALKPHOS 60  BILITOT 2.9*   PT/INR  Recent Labs  04/16/15 1512  LABPROT 15.0  INR 1.16    IMPRESSION:   #42 70 year old African-American female with acute upper GI bleed onset 3 days ago with both melena and coffee-ground emesis. Patient presented hypotensive and tachycardic with hemoglobin of 7.3. She is improving with fluid boluses and transfusions. No active bleeding since arrival. Suspect she does have underlying cirrhosis and therefore cannot rule out variceal bleed, versus portal gastropathy, Mallory-Weiss tear, or peptic ulcer disease  #2 hypokalemia  Plan:  Patient will be admitted  to stepdown unit Continue IV PPI infusion Add IV octreotide Serial hemoglobins every 6 hours and transfuse to keep hemoglobin 8 Monitor platelets and consider platelet transfusion for further drift Replace potassium High level of concern for withdrawal-start withdrawal protocol Patient is currently scheduled for EGD at 8:30 tomorrow morning, however if has further active bleeding this evening may need to be done sooner. Plans discussed with patient and her daughter and they are in agreement and questions answered. We will follow with you.   Amy Esterwood  04/16/2015, 4:13 PM     Attending physician's note   I have taken a history, examined the patient and reviewed the chart. I agree with the Advanced Practitioner's note, impression and recommendations. Acute UGI bleed with coffee  ground emesis, melena and ABL anemia. 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 R/O ulcer, MW tear, varices, gastropathy. IV octreotide and IV PPI infusions. Transfusions to keep Hb > 8. EGD at 0830 tomorrow. The risks (including bleeding, perforation, infection, missed lesions, medication reactions and possible hospitalization or surgery if complications occur), benefits, and alternatives to endoscopy with possible biopsy and possible dilation were discussed with the patient and they consent to proceed.     Lucio Edward, MD [facility] [phone] Mon-Fri 8a-5p [phone] after 5p, weekends, holidays

## 2015-04-16 NOTE — ED Notes (Signed)
Patient transported to X-ray 

## 2015-04-16 NOTE — ED Notes (Signed)
Patient signed [place]. Witnessed by Duard Larsen RN

## 2015-04-16 NOTE — ED Notes (Signed)
Pt urinated in bedpan but unable to measure due to patients family member discarded specimen before measuring.

## 2015-04-16 NOTE — H&P (Signed)
[facility] History and Physical   Patient: Kylie Torres [id number]   PCP: Default, Provider, MD DOB: Mar 17, 1945   DOA: 04/16/2015   DOS: 04/16/2015   DOS: the patient was seen and examined on 04/16/2015  Referring physician: Dr. Wilson Singer Chief Complaint: blood in the vomit  HPI: Kylie Torres is a 70 y.o. female with Past medical history of alcohol use. The patient presented with complaints of vomiting of blood. She mentions that since last 3 days she has been having complaints of upper abdominal pain as well as vomiting of the blood. The patient had 2 episodes a day for last 3 days. Today her pain was significantly severe and therefore she decided to come to the hospital. She denies having any prior episodes of vomiting of blood. She mentions she takes ibuprofen every now and then. She told me that she drinks once a week although she told the GI attending that the patient has been drinking once every day. She drinks 3 glasses of gin. Prior to this event the patient had drink more than her usual glasses. She denies having any black color bowel movement or active bleeding. Denies having any burning urination. She has not seen a physician on a regular basis. She is losing weight but unknown quantity.  The patient is coming from home.  At her baseline ambulates without any support And is independent for most of her ADL; manages her medication on her own.  Review of Systems: as mentioned in the history of present illness.  A comprehensive review of the other systems is negative.  History reviewed. No pertinent past medical history. Past Surgical History  Procedure Laterality Date  . Breast lumpectomy     Social History:  reports that she has been smoking Cigarettes.  She has been smoking about 0.25 packs per day. She does not have any smokeless tobacco history on file. She reports that she drinks about 1.8 oz of alcohol per week. She reports that she does not use illicit  drugs.  No Known Allergies  Family History  Problem Relation Age of Onset  . Cancer Mother   . Heart attack Father     Prior to Admission medications   Medication Sig Start Date End Date Taking? Authorizing Provider  ibuprofen (ADVIL,MOTRIN) 200 MG tablet Take 400 mg by mouth every 8 (eight) hours as needed. For pain.   Yes Historical Provider, MD  Tetrahydrozoline HCl (EYE DROPS OP) Place 1 drop into both eyes daily. In the evening   Yes Historical Provider, MD    Physical Exam: Filed Vitals:   04/16/15 1530 04/16/15 1545 04/16/15 1600 04/16/15 1631  BP: 90/70 111/84 112/61 104/71  Pulse: 99 97 96 98  Temp:    97.9 F (36.6 C)  TempSrc:    Oral  Resp: 21 18 16 22   SpO2: 98% 100% 100% 100%    General: Alert, Awake and Oriented to Time, Place and Person. Appear in mild distress Eyes: PERRL ENT: Oral Mucosa clear moist. Neck: no JVD Cardiovascular: S1 and S2 Present, no Murmur, Peripheral Pulses Present Respiratory: Bilateral Air entry equal and Decreased,  Clear to Auscultation, no Crackles, no wheezes Abdomen: Bowel Sound present, Soft and mild epigastric tenderness Skin: no Rash Extremities: no Pedal edema, no calf tenderness Neurologic: Grossly no focal neuro deficit.  Labs on Admission:  CBC:  Recent Labs Lab 04/16/15 1512  WBC 7.4  NEUTROABS 5.7  HGB 7.3*  HCT 21.7*  MCV 100.0  PLT 64*  CMP     Component Value Date/Time   NA 142 04/16/2015 1512   K 2.7* 04/16/2015 1512   CL 100* 04/16/2015 1512   CO2 29 04/16/2015 1512   GLUCOSE 130* 04/16/2015 1512   BUN 30* 04/16/2015 1512   CREATININE 1.01* 04/16/2015 1512   CALCIUM 7.7* 04/16/2015 1512   PROT 5.8* 04/16/2015 1512   ALBUMIN 3.4* 04/16/2015 1512   AST 50* 04/16/2015 1512   ALT 20 04/16/2015 1512   ALKPHOS 60 04/16/2015 1512   BILITOT 2.9* 04/16/2015 1512   GFRNONAA 56* 04/16/2015 1512   GFRAA >60 04/16/2015 1512   Radiological Exams on Admission: Dg Chest 2 View  04/16/2015  CLINICAL  DATA:  Weakness, dizziness, vomiting, epigastric pain EXAM: CHEST  2 VIEW COMPARISON:  None. FINDINGS: Cardiomediastinal silhouette is unremarkable. No acute infiltrate or pleural effusion. No pulmonary edema. Mild degenerative changes thoracic spine. IMPRESSION: No active cardiopulmonary disease. Mild degenerative changes thoracic spine. Electronically Signed   By: Lahoma Crocker M.D.   On: 04/16/2015 17:26   EKG: Independently reviewed. normal sinus rhythm, nonspecific ST and T waves changes.  Assessment/Plan 1. Hematemesis With upper GI bleed. Multifactorial, and acid induced, alcoholic gastritis, Mallory-Weiss tear. Or variceal bleeding cannot be ruled out. With this the patient will be admitted in the step down unit. Continue IV infusion with Protonix. IV fluids as needed. 2 units of PRBC. Octreotide should also be initiated in this patient. Monitor H&H every 6 hours.  2.history of alcohol abuse. Patient will be placed on CIWA protocol.  3. Hypotension. A likely from dehydration and vomiting. Continue IV hydration after completion of the PRBC.  4. Thrombocytopenia. No prior blood work to compare. Likely appearing to be chronic due to alcohol abuse. I would recheck the platelet count after the transfusion and the patient may require platelet transfusion if she has any further episode of bleeding.  5. Hypokalemia. We'll replace.  6. Epigastric pain. Check x-ray. EKG unremarkable. Check lipase level.   Nutrition: npo DVT Prophylaxis: mechanical compression device  Advance goals of care discussion: full code   Consults: GI  Family Communication: family was present at bedside, at the time of interview.  Opportunity was given to ask question and all questions were answered satisfactorily.  Disposition: Admitted as inpatient, step-down unit.  Author: Berle Mull, MD [facility] Pager: [phone] 04/16/2015  If 7PM-7AM, please contact  night-coverage [email or web address] Password [email or web address]

## 2015-04-16 NOTE — ED Notes (Signed)
Patient here from home with complaints of vomiting dark blood since 3/18. Dark tarry stools. Abd pain lower quadrant. Dizziness. Hx ETOH. 500 ml NS, 4 mg Zofran.

## 2015-04-17 ENCOUNTER — Encounter (HOSPITAL_COMMUNITY): Payer: Self-pay | Admitting: Anesthesiology

## 2015-04-17 ENCOUNTER — Encounter (HOSPITAL_COMMUNITY): Payer: Self-pay | Admitting: Gastroenterology

## 2015-04-17 ENCOUNTER — Encounter (HOSPITAL_COMMUNITY)
Admission: EM | Disposition: A | Discharge status: Home Health | Payer: Self-pay | Source: Home / Self Care | Attending: Internal Medicine

## 2015-04-17 DIAGNOSIS — K922 Gastrointestinal hemorrhage, unspecified: Secondary | ICD-10-CM

## 2015-04-17 DIAGNOSIS — K2961 Other gastritis with bleeding: Secondary | ICD-10-CM

## 2015-04-17 DIAGNOSIS — R110 Nausea: Secondary | ICD-10-CM

## 2015-04-17 DIAGNOSIS — K254 Chronic or unspecified gastric ulcer with hemorrhage: Secondary | ICD-10-CM | POA: Insufficient documentation

## 2015-04-17 DIAGNOSIS — K253 Acute gastric ulcer without hemorrhage or perforation: Secondary | ICD-10-CM | POA: Insufficient documentation

## 2015-04-17 HISTORY — PX: ESOPHAGOGASTRODUODENOSCOPY (EGD) WITH PROPOFOL: SHX5813

## 2015-04-17 LAB — COMPREHENSIVE METABOLIC PANEL
ALBUMIN: 3.2 g/dL — AB (ref 3.5–5.0)
ALT: 18 U/L (ref 14–54)
ANION GAP: 12 (ref 5–15)
AST: 43 U/L — ABNORMAL HIGH (ref 15–41)
Alkaline Phosphatase: 52 U/L (ref 38–126)
BILIRUBIN TOTAL: 2.7 mg/dL — AB (ref 0.3–1.2)
BUN: 23 mg/dL — ABNORMAL HIGH (ref 6–20)
CALCIUM: 7 mg/dL — AB (ref 8.9–10.3)
CO2: 19 mmol/L — ABNORMAL LOW (ref 22–32)
Chloride: 107 mmol/L (ref 101–111)
Creatinine, Ser: 0.66 mg/dL (ref 0.44–1.00)
GLUCOSE: 106 mg/dL — AB (ref 65–99)
POTASSIUM: 4.1 mmol/L (ref 3.5–5.1)
Sodium: 138 mmol/L (ref 135–145)
TOTAL PROTEIN: 5.4 g/dL — AB (ref 6.5–8.1)

## 2015-04-17 LAB — CBC
HCT: 26.8 % — ABNORMAL LOW (ref 36.0–46.0)
HCT: 26.9 % — ABNORMAL LOW (ref 36.0–46.0)
HEMATOCRIT: 29.2 % — AB (ref 36.0–46.0)
HEMATOCRIT: 29.5 % — AB (ref 36.0–46.0)
HEMOGLOBIN: 9.3 g/dL — AB (ref 12.0–15.0)
HEMOGLOBIN: 9.8 g/dL — AB (ref 12.0–15.0)
Hemoglobin: 9.5 g/dL — ABNORMAL LOW (ref 12.0–15.0)
Hemoglobin: 10.1 g/dL — ABNORMAL LOW (ref 12.0–15.0)
MCH: 31.4 pg (ref 26.0–34.0)
MCH: 31.7 pg (ref 26.0–34.0)
MCH: 31.8 pg (ref 26.0–34.0)
MCH: 32.5 pg (ref 26.0–34.0)
MCHC: 33.6 g/dL (ref 30.0–36.0)
MCHC: 34.2 g/dL (ref 30.0–36.0)
MCHC: 34.7 g/dL (ref 30.0–36.0)
MCHC: 35.3 g/dL (ref 30.0–36.0)
MCV: 90 fL (ref 78.0–100.0)
MCV: 90.5 fL (ref 78.0–100.0)
MCV: 94.5 fL (ref 78.0–100.0)
MCV: 94.9 fL (ref 78.0–100.0)
PLATELETS: 71 10*3/uL — AB (ref 150–400)
Platelets: 34 10*3/uL — ABNORMAL LOW (ref 150–400)
Platelets: 52 10*3/uL — ABNORMAL LOW (ref 150–400)
Platelets: 64 10*3/uL — ABNORMAL LOW (ref 150–400)
RBC: 2.96 MIL/uL — ABNORMAL LOW (ref 3.87–5.11)
RBC: 2.99 MIL/uL — ABNORMAL LOW (ref 3.87–5.11)
RBC: 3.11 MIL/uL — ABNORMAL LOW (ref 3.87–5.11)
RBC: 3.09 MIL/uL — AB (ref 3.87–5.11)
RDW: 20.2 % — ABNORMAL HIGH (ref 11.5–15.5)
RDW: 20.7 % — AB (ref 11.5–15.5)
RDW: 20.8 % — ABNORMAL HIGH (ref 11.5–15.5)
RDW: 20.9 % — AB (ref 11.5–15.5)
WBC: 6.3 10*3/uL (ref 4.0–10.5)
WBC: 6.7 10*3/uL (ref 4.0–10.5)
WBC: 7.1 10*3/uL (ref 4.0–10.5)
WBC: 7.9 10*3/uL (ref 4.0–10.5)

## 2015-04-17 LAB — TYPE AND SCREEN
ABO/RH(D): A POS
Antibody Screen: NEGATIVE
Unit division: 0
Unit division: 0

## 2015-04-17 LAB — URINALYSIS, ROUTINE W REFLEX MICROSCOPIC
Bilirubin Urine: NEGATIVE
GLUCOSE, UA: NEGATIVE mg/dL
Hgb urine dipstick: NEGATIVE
KETONES UR: 15 mg/dL — AB
LEUKOCYTES UA: NEGATIVE
NITRITE: NEGATIVE
PH: 6 (ref 5.0–8.0)
Protein, ur: NEGATIVE mg/dL
Specific Gravity, Urine: 1.008 (ref 1.005–1.030)

## 2015-04-17 LAB — PROTIME-INR
INR: 1.18 (ref 0.00–1.49)
PROTHROMBIN TIME: 15.1 s (ref 11.6–15.2)

## 2015-04-17 LAB — LIPASE, BLOOD: Lipase: 18 U/L (ref 11–51)

## 2015-04-17 LAB — APTT: APTT: 20 s — AB (ref 24–37)

## 2015-04-17 SURGERY — ESOPHAGOGASTRODUODENOSCOPY (EGD) WITH PROPOFOL
Anesthesia: Monitor Anesthesia Care

## 2015-04-17 MED ORDER — MIDAZOLAM HCL 5 MG/ML IJ SOLN
INTRAMUSCULAR | Status: AC
  Filled 2015-04-17: qty 1

## 2015-04-17 MED ORDER — LACTATED RINGERS IV SOLN: INTRAVENOUS | Status: DC

## 2015-04-17 MED ORDER — SODIUM CHLORIDE 0.45 % IV SOLN
INTRAVENOUS | Status: DC
  Administered 2015-04-17: 21:00:00 via INTRAVENOUS

## 2015-04-17 MED ORDER — MAGNESIUM SULFATE 2 GM/50ML IV SOLN
2.0000 g | Freq: Once | INTRAVENOUS | Status: AC
  Administered 2015-04-17: 2 g via INTRAVENOUS
  Filled 2015-04-17: qty 50

## 2015-04-17 MED ORDER — FENTANYL CITRATE (PF) 100 MCG/2ML IJ SOLN
INTRAMUSCULAR | Status: DC | PRN
  Administered 2015-04-17: 25 ug via INTRAVENOUS

## 2015-04-17 MED ORDER — FENTANYL CITRATE (PF) 100 MCG/2ML IJ SOLN
INTRAMUSCULAR | Status: AC
  Filled 2015-04-17: qty 2

## 2015-04-17 MED ORDER — HYDRALAZINE HCL 20 MG/ML IJ SOLN: 10.0000 mg | Freq: Four times a day (QID) | INTRAMUSCULAR | Status: DC | PRN

## 2015-04-17 MED ORDER — LABETALOL HCL 5 MG/ML IV SOLN
20.0000 mg | Freq: Once | INTRAVENOUS | Status: AC
  Administered 2015-04-17: 20 mg via INTRAVENOUS
  Filled 2015-04-17: qty 4

## 2015-04-17 MED ORDER — PANTOPRAZOLE SODIUM 40 MG IV SOLR
40.0000 mg | Freq: Two times a day (BID) | INTRAVENOUS | Status: DC
  Administered 2015-04-17 – 2015-04-19 (×5): 40 mg via INTRAVENOUS
  Filled 2015-04-17 (×7): qty 40

## 2015-04-17 MED ORDER — MIDAZOLAM HCL 10 MG/2ML IJ SOLN
INTRAMUSCULAR | Status: DC | PRN
  Administered 2015-04-17: 2 mg via INTRAVENOUS
  Administered 2015-04-17 (×2): 1 mg via INTRAVENOUS

## 2015-04-17 MED ORDER — FENTANYL CITRATE (PF) 100 MCG/2ML IJ SOLN: 25.0000 ug | INTRAMUSCULAR | Status: DC | PRN

## 2015-04-17 MED ORDER — AMLODIPINE BESYLATE 10 MG PO TABS
10.0000 mg | ORAL_TABLET | Freq: Every day | ORAL | Status: DC
  Administered 2015-04-17: 10 mg via ORAL
  Filled 2015-04-17 (×2): qty 1

## 2015-04-17 MED ORDER — LABETALOL HCL 5 MG/ML IV SOLN: 20.0000 mg | Freq: Once | INTRAVENOUS | Status: DC

## 2015-04-17 SURGICAL SUPPLY — 15 items

## 2015-04-17 NOTE — ED Notes (Signed)
Stuck pt twice unsuccessful Rn Di Kindle made aware

## 2015-04-17 NOTE — Progress Notes (Signed)
Patient listed as having Medicare insurance without a pcp.  EDCM spoke to patient at bedside.  Patient confirms she does not have a pcp.  [facility] provided patient with a list of pcps who accept Medicare insurance within a 25 mile radius of her zip code [phone].  Patient thankful for resources.  No further EDCM needs at this time.

## 2015-04-17 NOTE — Interval H&P Note (Signed)
History and Physical Interval Note:  04/17/2015 8:41 AM  Kylie Torres  has presented today for surgery, with the diagnosis of GI bleed ? varices  The various methods of treatment have been discussed with the patient and family. After consideration of risks, benefits and other options for treatment, the patient has consented to  Procedure(s): ESOPHAGOGASTRODUODENOSCOPY (EGD) WITH PROPOFOL (N/A) as a surgical intervention .  The patient's history has been reviewed, patient examined, no change in status, stable for surgery.  I have reviewed the patient's chart and labs.  Questions were answered to the patient's satisfaction.     Pricilla Riffle. Fuller Plan

## 2015-04-17 NOTE — Progress Notes (Signed)
[facility] Progress Note   Kylie Torres  [id number]  DOB: Aug 05, 1945  DOA: 04/16/2015 PCP: Default, Provider, MD  Brief narrative: Kylie Torres is a 70 y.o. female with a history of alcohol abuse, smoking admitted for bloody vomitus. She had been vomiting for at least 3 days prior to admission. She was also having upper abdominal pain   Subjective: No complaints of nausea or vomiting. Has mild upper abdominal pain No cough congestion and sore throat. Complains of pain and burning when she has to urinate.  Assessment/Plan: Principal Problem:   Hematemesis -Status post EGD today-found to have 2 nonbleeding gastric ulcers-erosion and gastric fundus with stigmata of recent bleeding which was clipped-multiple diffuse erosions without bleeding in the first portion of duodenum- 2 small nonbleeding Ezequiel Essex tears and gastric cardia -Have discussed with her that she needs to stop drinking-also takes occasional NSAIDs-advised to hold off on taking NSAIDs for now -On liquid diet per GI-follow for further bleeding-continue IV Protonix- DC octreotide  Active Problems:     Symptomatic anemia -2 units packed red blood cells transfused yesterday her hemoglobin of 7.3-hemoglobin above 10 now  Hypomagnesemia -Replace with 2 g of IV magnesium-Recheck magnesium tomorrow  Hypertension -Start Norvasc and follow-asymptomatic  Low-grade fever -99 yesterday and on 100.9 today -Complained of dysuria but UA is negative -Chest x-ray negative -No symptoms of infection -No leukocytosis -Follow    Alcohol use ([facility])   Hypokalemia -Replaced  Elevated LFTs/ Thrombocytopenia  -Likely due to alcohol abuse  Alcohol abuse -Drinks 3 cups of gin a day-last drink was on Sunday-no withdrawal symptoms noted so far-advised to stop drinking     Antibiotics: Anti-infectives    None     Code Status:     Code Status Orders        Start     Ordered   04/16/15 1642  Full code    Continuous     04/16/15 1643    Code Status History    Date Active Date Inactive Code Status Order ID Comments User Context   This patient has a current code status but no historical code status.     Family Communication:  Disposition Plan: Home in 1-2 days DVT prophylaxis: SCDs Consultants: GI Procedures: EGD today    Objective: There were no vitals filed for this visit.  Intake/Output Summary (Last 24 hours) at 04/17/15 1844 Last data filed at 04/17/15 1504  Gross per 24 hour  Intake    795 ml  Output     75  ml  Net    720 ml     Vitals Filed Vitals:   04/17/15 1056 04/17/15 1254 04/17/15 1525 04/17/15 1800  BP: 156/81 171/100 167/98 162/111  Pulse: 83 90 82 95  Temp: 98.9 F (37.2 C) 99 F (37.2 C) 98.9 F (37.2 C) 100.9 F (38.3 C)  TempSrc: Oral Oral Oral Oral  Resp:   20 18  SpO2: 98% 98% 98% 98%    Exam:  General:  Pt is alert, not in acute distress  HEENT: No icterus, No thrush, oral mucosa moist  Cardiovascular: regular rate and rhythm, S1/S2 No murmur  Respiratory: clear to auscultation bilaterally   Abdomen: Soft, +Bowel sounds, Mild epigastric tenderness, non distended, no guarding  MSK: No cyanosis or clubbing- no pedal edema   Data Reviewed: Basic Metabolic Panel:  Recent Labs Lab 04/16/15 1512 04/16/15 1518 04/17/15 0439  NA 142  --  138  K 2.7*  --  4.1  CL 100*  --  107  CO2 29  --  19*  GLUCOSE 130*  --  106*  BUN 30*  --  23*  CREATININE 1.01*  --  0.66  CALCIUM 7.7*  --  7.0*  MG  --  1.0*  --    Liver Function Tests:  Recent Labs Lab 04/16/15 1512 04/17/15 0439  AST 50* 43*  ALT 20 18  ALKPHOS 60 52  BILITOT 2.9* 2.7*  PROT 5.8* 5.4*  ALBUMIN 3.4* 3.2*    Recent Labs Lab 04/17/15 0023  LIPASE 18   No results for input(s): AMMONIA in the last 168 hours. CBC:  Recent Labs Lab 04/16/15 1512 04/17/15 0023 04/17/15 0446 04/17/15 1157 04/17/15 1654  WBC 7.4 6.3 7.1 6.7 7.9  NEUTROABS 5.7  --   --    --   --   HGB 7.3* 9.3* 9.5* 9.8* 10.1*  HCT 21.7* 26.8* 26.9* 29.2* 29.5*  MCV 100.0 90.5 90.0 94.5 94.9  PLT 64* 34* 71* 52* 64*   Cardiac Enzymes: No results for input(s): CKTOTAL, CKMB, CKMBINDEX, TROPONINI in the last 168 hours. BNP (last 3 results) No results for input(s): BNP in the last 8760 hours.  ProBNP (last 3 results) No results for input(s): PROBNP in the last 8760 hours.  CBG: No results for input(s): GLUCAP in the last 168 hours.  No results found for this or any previous visit (from the past 240 hour(s)).   Studies: Dg Chest 2 View  04/16/2015  CLINICAL DATA:  Weakness, dizziness, vomiting, epigastric pain EXAM: CHEST  2 VIEW COMPARISON:  None. FINDINGS: Cardiomediastinal silhouette is unremarkable. No acute infiltrate or pleural effusion. No pulmonary edema. Mild degenerative changes thoracic spine. IMPRESSION: No active cardiopulmonary disease. Mild degenerative changes thoracic spine. Electronically Signed   By: Lahoma Crocker M.D.   On: 04/16/2015 17:26    Scheduled Meds:  Scheduled Meds: . amLODipine  10 mg Oral Daily  . folic acid  1 mg Oral Daily  . labetalol  20 mg Intravenous Once  . multivitamin with minerals  1 tablet Oral Daily  . ondansetron (ZOFRAN) IV  4 mg Intravenous 4 times per day   Or  . ondansetron  4 mg Oral 4 times per day  . pantoprazole (PROTONIX) IV  40 mg Intravenous Q12H  . sodium chloride flush  3 mL Intravenous Q12H  . thiamine  100 mg Oral Daily   Continuous Infusions: . sodium chloride      Time spent on care of this patient: 35 min   [place], MD 04/17/2015, 6:44 PM  LOS: 1 day   [facility] Office  [phone] Pager - Text Page per [email or web address] If 7PM-7AM, please contact night-coverage [email or web address]

## 2015-04-17 NOTE — ED Notes (Signed)
Pt changed for the 6th time since my shift started; unsure as to the timing of each.

## 2015-04-17 NOTE — Anesthesia Preprocedure Evaluation (Deleted)
Anesthesia Evaluation  Patient identified by MRN, date of birth, ID band Patient awake    Reviewed: Allergy & Precautions, H&P , NPO status , Patient's Chart, lab work & pertinent test results  Airway Mallampati: II  TM Distance: >3 FB Neck ROM: full    Dental no notable dental hx. (+) Dental Advisory Given, Teeth Intact   Pulmonary neg pulmonary ROS, Current Smoker,    Pulmonary exam normal breath sounds clear to auscultation       Cardiovascular Exercise Tolerance: Good negative cardio ROS Normal cardiovascular exam Rhythm:regular Rate:Normal     Neuro/Psych negative neurological ROS  negative psych ROS   GI/Hepatic negative GI ROS, (+)     substance abuse  alcohol use, Upper GI bleeding   Endo/Other  negative endocrine ROS  Renal/GU negative Renal ROS  negative genitourinary   Musculoskeletal   Abdominal   Peds  Hematology negative hematology ROS (+) anemia , hgb 9.5  plt 71K   Anesthesia Other Findings   Reproductive/Obstetrics negative OB ROS                             Anesthesia Physical Anesthesia Plan  ASA: III  Anesthesia Plan: MAC   Post-op Pain Management:    Induction:   Airway Management Planned:   Additional Equipment:   Intra-op Plan:   Post-operative Plan:   Informed Consent: I have reviewed the patients History and Physical, chart, labs and discussed the procedure including the risks, benefits and alternatives for the proposed anesthesia with the patient or authorized representative who has indicated his/her understanding and acceptance.   Dental Advisory Given  Plan Discussed with: CRNA  Anesthesia Plan Comments:         Anesthesia Quick Evaluation

## 2015-04-17 NOTE — H&P (View-Only) (Signed)
Consultation  Referring Provider: ED MD - Dirk Dress Primary Care Physician:  Default, Provider, MD Primary Gastroenterologist:  None/unassigned  Reason for Consultation:  GI bleed/anemia  HPI: Kylie Torres is a 70 y.o. female who is admitted through the ER this afternoon after presenting with complaints of weakness dizziness, melena and vomiting of coffee-ground-appearing material. Patient is unassigned and has no PCP. She says she has not had any prior history of GI bleeding. She says she had onset about 3 days ago with black stools and then intermittent maturing of black material. Early today apparently her stool looked more normal but she vomited fairly large amount of black material. She had increased weakness and dizziness and came to the emergency room. She denies syncope. No complaints of abdominal pain, no dysphagia heartburn indigestion or recent nausea. She does take occasional ibuprofen though no more than a couple per week no regular aspirin BCs, Goody's, etc. She has been drinking alcohol on a daily basis for the past 20 years and states she usually drinks 2-3 glasses of gin per day. She does not have known liver disease and has not had DTs though has not had any recent attempts to stop alcohol use. Patient was hypotensive on arrival blood pressure 67/56 pulse 110 she has responded to fluid boluses with blood pressure systolic now [id number]. Labs show hemoglobin 7.3 platelets 64 INR is normal ,potassium 2.7,  total bili 2.9 ,alkaline phosphatase 60, AST of 50 and ALT of 20   History reviewed. No pertinent past medical history.  Past Surgical History  Procedure Laterality Date  . Breast lumpectomy      Prior to Admission medications   Medication Sig Start Date End Date Taking? Authorizing Provider  ibuprofen (ADVIL,MOTRIN) 200 MG tablet Take 400 mg by mouth every 8 (eight) hours as needed. For pain.   Yes Historical Provider, MD  Tetrahydrozoline HCl (EYE DROPS OP) Place 1 drop into both  eyes daily. In the evening   Yes Historical Provider, MD    Current Facility-Administered Medications  Medication Dose Route Frequency Provider Last Rate Last Dose  . 0.9 %  sodium chloride infusion  10 mL/hr Intravenous Once Virgel Manifold, MD      . pantoprazole (PROTONIX) 80 mg in sodium chloride 0.9 % 250 mL (0.32 mg/mL) infusion  8 mg/hr Intravenous Continuous Virgel Manifold, MD 25 mL/hr at 04/16/15 1533 8 mg/hr at 04/16/15 1533  . potassium chloride 10 mEq in 100 mL IVPB  10 mEq Intravenous Q1 Hr x 4 Virgel Manifold, MD      . potassium chloride SA (K-DUR,KLOR-CON) CR tablet 40 mEq  40 mEq Oral Once Virgel Manifold, MD       Current Outpatient Prescriptions  Medication Sig Dispense Refill  . ibuprofen (ADVIL,MOTRIN) 200 MG tablet Take 400 mg by mouth every 8 (eight) hours as needed. For pain.    . Tetrahydrozoline HCl (EYE DROPS OP) Place 1 drop into both eyes daily. In the evening      Allergies as of 04/16/2015  . (No Known Allergies)    Family History  Problem Relation Age of Onset  . Cancer Mother   . Heart attack Father     Social History   Social History  . Marital Status: Divorced    Spouse Name: N/A  . Number of Children: N/A  . Years of Education: N/A   Occupational History  . Not on file.   Social History Main Topics  . Smoking status: Current Every Day Smoker --  0.25 packs/day    Types: Cigarettes  . Smokeless tobacco: Not on file  . Alcohol Use: 1.8 oz/week    3 Shots of liquor per week  . Drug Use: No  . Sexual Activity: Not on file   Other Topics Concern  . Not on file   Social History Narrative    Review of Systems: Pertinent positive and negative review of systems were noted in the above HPI section.  All other review of systems was otherwise negative.  Physical Exam: Vital signs in last 24 hours: Temp:  [98.2 F (36.8 C)-98.5 F (36.9 C)] 98.5 F (36.9 C) (03/21 1452) Pulse Rate:  [96-111] 99 (03/21 1530) Resp:  [18-26] 21 (03/21  1530) BP: (67-93)/(56-70) 90/70 mmHg (03/21 1530) SpO2:  [98 %-99 %] 98 % (03/21 1530)   General:   Alert,  Well-developed,  Older AA female , pleasant and cooperative in NAD Head:  Normocephalic and atraumatic. Eyes:  Sclera clear, no icterus.   Conjunctiva pale. Ears:  Normal auditory acuity. Nose:  No deformity, discharge,  or lesions. Mouth:  No deformity or lesions.   Neck:  Supple; no masses or thyromegaly. Lungs:  Clear throughout to auscultation.   No wheezes, crackles, or rhonchi.  Heart:  Regular rate and rhythm; soft systolic murmur, . Abdomen:  Soft, nontender, BS active,nonpalp mass or hsm.   Rectal:  Deferred  Msk:  Symmetrical without gross deformities. . Pulses:  Normal pulses noted. Extremities:  Without clubbing or edema. Neurologic:  Alert and  oriented x4;  grossly normal neurologically. Skin:  Intact without significant lesions or rashes.. Psych:  Alert and cooperative. Normal mood and affect.  Intake/Output from previous day:   Intake/Output this shift:    Lab Results:  Recent Labs  04/16/15 1512  WBC 7.4  HGB 7.3*  HCT 21.7*  PLT 64*   BMET  Recent Labs  04/16/15 1512  NA 142  K 2.7*  CL 100*  CO2 29  GLUCOSE 130*  BUN 30*  CREATININE 1.01*  CALCIUM 7.7*   LFT  Recent Labs  04/16/15 1512  PROT 5.8*  ALBUMIN 3.4*  AST 50*  ALT 20  ALKPHOS 60  BILITOT 2.9*   PT/INR  Recent Labs  04/16/15 1512  LABPROT 15.0  INR 1.16    IMPRESSION:   #62 70 year old African-American female with acute upper GI bleed onset 3 days ago with both melena and coffee-ground emesis. Patient presented hypotensive and tachycardic with hemoglobin of 7.3. She is improving with fluid boluses and transfusions. No active bleeding since arrival. Suspect she does have underlying cirrhosis and therefore cannot rule out variceal bleed, versus portal gastropathy, Mallory-Weiss tear, or peptic ulcer disease  #2 hypokalemia  Plan:  Patient will be admitted  to stepdown unit Continue IV PPI infusion Add IV octreotide Serial hemoglobins every 6 hours and transfuse to keep hemoglobin 8 Monitor platelets and consider platelet transfusion for further drift Replace potassium High level of concern for withdrawal-start withdrawal protocol Patient is currently scheduled for EGD at 8:30 tomorrow morning, however if has further active bleeding this evening may need to be done sooner. Plans discussed with patient and her daughter and they are in agreement and questions answered. We will follow with you.   Kylie Torres  04/16/2015, 4:13 PM     Attending physician's note   I have taken a history, examined the patient and reviewed the chart. I agree with the Advanced Practitioner's note, impression and recommendations. Acute UGI bleed with coffee  ground emesis, melena and ABL anemia. 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 R/O ulcer, MW tear, varices, gastropathy. IV octreotide and IV PPI infusions. Transfusions to keep Hb > 8. EGD at 0830 tomorrow. The risks (including bleeding, perforation, infection, missed lesions, medication reactions and possible hospitalization or surgery if complications occur), benefits, and alternatives to endoscopy with possible biopsy and possible dilation were discussed with the patient and they consent to proceed.     Lucio Edward, MD [facility] [phone] Mon-Fri 8a-5p [phone] after 5p, weekends, holidays

## 2015-04-17 NOTE — ED Notes (Addendum)
Assisted patient using the bedpan. Urinated red, amber color urine.

## 2015-04-17 NOTE — Op Note (Signed)
[facility] Patient Name: Kylie Torres Procedure Date: 04/17/2015 MRN: [id number] Attending MD: Ladene Artist , MD Date of Birth: 1945-04-11 CSN:  Age: 70 Admit Type: Outpatient Procedure:                Upper GI endoscopy Indications:              Recent gastrointestinal bleeding Providers:                Pricilla Riffle. Fuller Plan, MD, Hilma Favors, RN, Corliss Parish, Technician Referring MD:             [facility] Medicines:                Fentanyl 50 micrograms IV, Midazolam 4 mg IV Complications:            No immediate complications. Estimated Blood Loss:     Estimated blood loss: none. Procedure:                Pre-Anesthesia Assessment:                           - Prior to the procedure, a History and Physical                            was performed, and patient medications and                            allergies were reviewed. The patient's tolerance of                            previous anesthesia was also reviewed. The risks                            and benefits of the procedure and the sedation                            options and risks were discussed with the patient.                            All questions were answered, and informed consent                            was obtained. Prior Anticoagulants: The patient has                            taken no previous anticoagulant or antiplatelet                            agents. ASA Grade Assessment: III - A patient with                            severe systemic disease. After reviewing the risks  and benefits, the patient was deemed in                            satisfactory condition to undergo the procedure.                           After obtaining informed consent, the endoscope was                            passed under direct vision. Throughout the                            procedure, the patient's blood pressure, pulse, and                    oxygen saturations were monitored continuously. The                            [id number]) scope was introduced through the                            mouth, and advanced to the second part of duodenum.                            The upper GI endoscopy was accomplished without                            difficulty. The patient tolerated the procedure                            fairly well. Scope In: Scope Out: Findings:      Two non-bleeding cratered gastric ulcers with no stigmata of bleeding       were found in the gastric antrum. The largest lesion was 7 mm in largest       dimension. Biopsies were taken with a cold forceps for histology.       Estimated blood loss was minimal. Two small non-bleeding Mallory-Weiss       tears in the gastric cardia.      A single localized, 4 mm bleeding linear erosion was found in the       gastric fundus. There were stigmata of recent bleeding. To stop active       bleeding, one hemostatic clip was successfully placed. There was no       bleeding at the end of the procedure.      Multiple diffuse erosions without bleeding were found in the first       portion of the duodenum. No biopsies or other specimens were collected       for this exam. The second portion of the duodenum appeared normal.      The examined esophagus was normal. The folds in the distal esophagus       appeared mildly edematous. The exam was otherwise normal. Impression:               - Two non-bleeding gastric ulcers with no stigmata  of bleeding. Biopsied.                           - Bleeding erosive gastropathy. Clip was placed.                           - Duodenal erosions without bleeding.                           - Two small non-bleeding Mallory-Weiss tears in the                            gastric cardia                           - Otherwise normal examination. Moderate Sedation:      Moderate (conscious) sedation was  administered by the endoscopy nurse       and supervised by the endoscopist. The following parameters were       monitored: oxygen saturation, heart rate, blood pressure, respiratory       rate, EKG, adequacy of pulmonary ventilation, and response to care.       Total physician intraservice time was 14 minutes. Recommendation:           - Return patient to hospital ward for ongoing care.                           - Full liquid diet.                           - Continue present medications including PPI bid                           - Await pathology results. Procedure Code(s):        --- Professional ---                           [id number], 59, Esophagogastroduodenoscopy, flexible,                            transoral; with control of bleeding, any method                           43239, Esophagogastroduodenoscopy, flexible,                            transoral; with biopsy, single or multiple                           G0500, Moderate sedation services provided by the                            same physician or other qualified health care                            professional performing a gastrointestinal  endoscopic service that sedation supports,                            requiring the presence of an independent trained                            observer to assist in the monitoring of the                            patient's level of consciousness and physiological                            status; initial 15 minutes of intra-service time;                            patient age 53 years or older (additional time may                            be reported with [id number], as appropriate) Diagnosis Code(s):        --- Professional ---                           K25.9, Gastric ulcer, unspecified as acute or                            chronic, without hemorrhage or perforation                           K31.89, Other diseases of stomach and duodenum                            K92.2, Gastrointestinal hemorrhage, unspecified                           K26.9, Duodenal ulcer, unspecified as acute or                            chronic, without hemorrhage or perforation CPT copyright 2016 [employer]. All rights reserved. The codes documented in this report are preliminary and upon coder review may  be revised to meet current compliance requirements. Pricilla Riffle. Fuller Plan, MD Ladene Artist, MD 04/17/2015 9:30:14 AM This report has been signed electronically. Number of Addenda: 0

## 2015-04-17 NOTE — ED Notes (Signed)
Bed: WA12 Expected date:  Expected time:  Means of arrival:  Comments: Pt in Endo 

## 2015-04-17 NOTE — ED Notes (Signed)
RN Di Kindle

## 2015-04-17 NOTE — ED Notes (Signed)
Spoke with hospitalist about obtaining CBC and H&H.  while infusing blood, he reported CBC and H&H can be performed after blood transfusion.

## 2015-04-18 ENCOUNTER — Encounter (HOSPITAL_COMMUNITY): Payer: Self-pay | Admitting: Gastroenterology

## 2015-04-18 ENCOUNTER — Inpatient Hospital Stay (HOSPITAL_COMMUNITY): Payer: Medicare Other

## 2015-04-18 DIAGNOSIS — E440 Moderate protein-calorie malnutrition: Secondary | ICD-10-CM

## 2015-04-18 DIAGNOSIS — I10 Essential (primary) hypertension: Secondary | ICD-10-CM

## 2015-04-18 DIAGNOSIS — D62 Acute posthemorrhagic anemia: Secondary | ICD-10-CM

## 2015-04-18 DIAGNOSIS — K7030 Alcoholic cirrhosis of liver without ascites: Secondary | ICD-10-CM

## 2015-04-18 DIAGNOSIS — K922 Gastrointestinal hemorrhage, unspecified: Secondary | ICD-10-CM

## 2015-04-18 LAB — CBC
HEMATOCRIT: 25.7 % — AB (ref 36.0–46.0)
HEMOGLOBIN: 9 g/dL — AB (ref 12.0–15.0)
MCH: 31.6 pg (ref 26.0–34.0)
MCHC: 35 g/dL (ref 30.0–36.0)
MCV: 90.2 fL (ref 78.0–100.0)
Platelets: 58 10*3/uL — ABNORMAL LOW (ref 150–400)
RBC: 2.85 MIL/uL — AB (ref 3.87–5.11)
RDW: 20.3 % — ABNORMAL HIGH (ref 11.5–15.5)
WBC: 9.1 10*3/uL (ref 4.0–10.5)

## 2015-04-18 LAB — MAGNESIUM
MAGNESIUM: 1.6 mg/dL — AB (ref 1.7–2.4)
Magnesium: 2.7 mg/dL — ABNORMAL HIGH (ref 1.7–2.4)

## 2015-04-18 LAB — COMPREHENSIVE METABOLIC PANEL
ALBUMIN: 3.1 g/dL — AB (ref 3.5–5.0)
ALT: 15 U/L (ref 14–54)
AST: 29 U/L (ref 15–41)
Alkaline Phosphatase: 47 U/L (ref 38–126)
Anion gap: 9 (ref 5–15)
BILIRUBIN TOTAL: 3.2 mg/dL — AB (ref 0.3–1.2)
BUN: 9 mg/dL (ref 6–20)
CHLORIDE: 98 mmol/L — AB (ref 101–111)
CO2: 25 mmol/L (ref 22–32)
CREATININE: 0.59 mg/dL (ref 0.44–1.00)
Calcium: 7.4 mg/dL — ABNORMAL LOW (ref 8.9–10.3)
GFR calc Af Amer: >60 mL/min (ref >60)
GLUCOSE: 144 mg/dL — AB (ref 65–99)
POTASSIUM: 2.9 mmol/L — AB (ref 3.5–5.1)
Sodium: 132 mmol/L — ABNORMAL LOW (ref 135–145)
TOTAL PROTEIN: 5.5 g/dL — AB (ref 6.5–8.1)

## 2015-04-18 LAB — BASIC METABOLIC PANEL
Anion gap: 10 (ref 5–15)
BUN: 9 mg/dL (ref 6–20)
CALCIUM: 7.8 mg/dL — AB (ref 8.9–10.3)
CO2: 23 mmol/L (ref 22–32)
CREATININE: 0.69 mg/dL (ref 0.44–1.00)
Chloride: 100 mmol/L — ABNORMAL LOW (ref 101–111)
GFR calc Af Amer: >60 mL/min (ref >60)
GLUCOSE: 167 mg/dL — AB (ref 65–99)
Potassium: 4.7 mmol/L (ref 3.5–5.1)
Sodium: 133 mmol/L — ABNORMAL LOW (ref 135–145)

## 2015-04-18 MED ORDER — POTASSIUM CHLORIDE CRYS ER 20 MEQ PO TBCR
40.0000 meq | EXTENDED_RELEASE_TABLET | Freq: Once | ORAL | Status: AC
  Administered 2015-04-18: 40 meq via ORAL
  Filled 2015-04-18: qty 2

## 2015-04-18 MED ORDER — POTASSIUM CHLORIDE CRYS ER 20 MEQ PO TBCR
40.0000 meq | EXTENDED_RELEASE_TABLET | ORAL | Status: AC
  Administered 2015-04-18 (×2): 40 meq via ORAL
  Filled 2015-04-18 (×2): qty 2

## 2015-04-18 MED ORDER — BOOST / RESOURCE BREEZE PO LIQD
1.0000 | Freq: Two times a day (BID) | ORAL | Status: DC
Start: 2015-04-18 — End: 2015-04-19
  Administered 2015-04-19 (×2): 1 via ORAL

## 2015-04-18 MED ORDER — PANTOPRAZOLE SODIUM 40 MG PO TBEC: 40.0000 mg | DELAYED_RELEASE_TABLET | Freq: Two times a day (BID) | ORAL | Status: DC

## 2015-04-18 MED ORDER — MAGNESIUM SULFATE 2 GM/50ML IV SOLN
2.0000 g | Freq: Once | INTRAVENOUS | Status: AC
  Administered 2015-04-18: 2 g via INTRAVENOUS
  Filled 2015-04-18: qty 50

## 2015-04-18 NOTE — Clinical Documentation Improvement (Signed)
Gastroenterology  Based on the clinical findings below, please document any associated diagnoses/conditions the patient has or may have.   Acute blood loss anemia  Other  Clinically Undetermined  Supporting Information: -- Documentation states "symptomatic anemia" with H/H 7.3/21.7 -- Treated with 2u PRBC -- EGD positive Acute gastric ulcers, duodenal ulcer, Mallory-Weiss tears gastric -- Hypotensive on admit 75/62  Please exercise your independent, professional judgment when responding. A specific answer is not anticipated or expected.   Thank You, Ezekiel Ina RN Steen [phone]

## 2015-04-18 NOTE — Progress Notes (Signed)
Patient ID: Kylie Torres, female   DOB: 1945-05-21, 70 y.o.   MRN: [id number]    Progress Note   Subjective  Feeling Ok - hungry- no N/V No further melena Nerves "rocky"   Objective   Vital signs in last 24 hours: Temp:  [98.4 F (36.9 C)-100.9 F (38.3 C)] 98.4 F (36.9 C) (03/23 0846) Pulse Rate:  [79-108] 79 (03/23 0846) Resp:  [18-42] 18 (03/23 0846) BP: (106-216)/(57-153) 127/65 mmHg (03/23 0846) SpO2:  [93 %-100 %] 99 % (03/23 0846) Weight:  [111 lb 4.8 oz (50.485 kg)-112 lb 6.4 oz (50.984 kg)] 112 lb 6.4 oz (50.984 kg) (03/23 0400) Last BM Date: 04/15/15 General:    AA  female in NAD Heart:  Regular rate and rhythm; no murmurs Lungs: Respirations even and unlabored, lungs CTA bilaterally Abdomen:  Soft, nontender and nondistended. Normal bowel sounds. Extremities:  Without edema. Neurologic:  Alert and oriented,  grossly normal neurologically. Psych:  Cooperative. Normal mood and affect.  Intake/Output from previous day: 03/22 0701 - 03/23 0700 In: 732.5 [I.V.:732.5] Out: 75 [Urine:75] Intake/Output this shift:    Lab Results:  Recent Labs  04/17/15 1157 04/17/15 1654 04/18/15 0516  WBC 6.7 7.9 9.1  HGB 9.8* 10.1* 9.0*  HCT 29.2* 29.5* 25.7*  PLT 52* 64* 58*   BMET  Recent Labs  04/16/15 1512 04/17/15 0439 04/18/15 0516  NA 142 138 132*  K 2.7* 4.1 2.9*  CL 100* 107 98*  CO2 29 19* 25  GLUCOSE 130* 106* 144*  BUN 30* 23* 9  CREATININE 1.01* 0.66 0.59  CALCIUM 7.7* 7.0* 7.4*   LFT  Recent Labs  04/18/15 0516  PROT 5.5*  ALBUMIN 3.1*  AST 29  ALT 15  ALKPHOS 47  BILITOT 3.2*   PT/INR  Recent Labs  04/16/15 1512 04/17/15 0439  LABPROT 15.0 15.1  INR 1.16 1.18    Studies/Results: Dg Chest 2 View  04/16/2015  CLINICAL DATA:  Weakness, dizziness, vomiting, epigastric pain EXAM: CHEST  2 VIEW COMPARISON:  None. FINDINGS: Cardiomediastinal silhouette is unremarkable. No acute infiltrate or pleural effusion. No pulmonary edema.  Mild degenerative changes thoracic spine. IMPRESSION: No active cardiopulmonary disease. Mild degenerative changes thoracic spine. Electronically Signed   By: Lahoma Crocker M.D.   On: 04/16/2015 17:26      Assessment / Plan:    #1 70 yo female alcoholic with acute UGI bleed secondary to gastric ulcers, bleeding gastric erosion, duodenal erosions and small mallory weiss tears- fortunately no varices Advance diet Continue BID PPI #2 anemia secondary to above- no further active bleeding #3 hypokalemia- #4 ETOH withdrawal- continue Ativan prn #5 probable cirrhosis - will schedule fo [place] to confirm  Principal Problem:   Hematemesis Active Problems:   GIB (gastrointestinal bleeding)   Symptomatic anemia   Hypotension   Alcohol use (HCC)   Hypokalemia   Hyperbilirubinemia   Thrombocytopenia (HCC)   UGIB (upper gastrointestinal bleed)   Acute gastric ulcer   Gastric erosion with bleeding    LOS: 2 days   Amy Esterwood  04/18/2015, 8:57 AM     Attending physician's note   I have taken an interval history, reviewed the chart and examined the patient. I agree with the Advanced Practitioner's note, impression and recommendations.  Continue PPI bid for 2 weeks then PPI qd long term.  Avoid ASA/NSAIDs/etoh.  Abd [place] to further evaluate for possible cirrhosis.   Lucio Edward, MD [facility] [phone] Mon-Fri 8a-5p [phone] after 5p, weekends, holidays

## 2015-04-18 NOTE — Progress Notes (Signed)
Family complaining that they have not been given results of any tests since admission.  Reviewed with family, pt came in hypotensive from low hgb from ulcers and mallory weiss tear.  Octreotide and protonix drips administered, ulcers not bleeding at present. [place] tonight was to r/o cirrhosis.  Printed material on gastric ulcers, cirrhosis, and alcoholism given and reviewed with family. Reminded that no results from [place] yet it was to rule out cirrhosis and they would be informed of results when available.  Fara Olden P

## 2015-04-18 NOTE — Progress Notes (Signed)
SATURATION QUALIFICATIONS: (This note is used to comply with regulatory documentation for home oxygen)  Patient Saturations on Room Air at Rest = 99%  Patient Saturations on Room Air while Ambulating = 96%  Patient Saturations on 2 Liters of oxygen while Ambulating = 98%  Please briefly explain why patient needs home oxygen: Does not qualify

## 2015-04-18 NOTE — Discharge Summary (Signed)
Physician Discharge Summary  Kylie Torres [id number] DOB: [birth year] DOA: 04/16/2015  PCP: Default, Provider, MD  Admit date: 04/16/2015 Discharge date: 04/19/2015  Time spent:60 minutes  Recommendations for Outpatient Follow-up:  1. Have asked her to find a PCP- case manager to give her number for [facility] 2. Will f/u with GI- Dr Derrill Memo will arrange appt 3. Needs Oupt ECHO  Discharge Condition: stable    Discharge Diagnoses:  Principal Problem:   Upper GI bleed Active Problems:   Acute blood loss anemia   Alcohol use (HCC)   Hypokalemia   Hyperbilirubinemia   Thrombocytopenia (HCC)   Acute gastric ulcer   Gastric erosion with bleeding   Malnutrition of moderate degree   HTN (hypertension)   History of present illness:  Kylie Torres is a 70 y.o. female with a history of alcohol abuse, smoking admitted for bloody vomitus. She had been vomiting for at least 3 days prior to admission but continued to try to drink alcohol. Kylie Torres  admitted to vomiting blood and having black stool. She was also having upper abdominal pain as well.    Hospital Course:  Principal Problem:  Hematemesis -Status post EGD 3/22-found to have 2 nonbleeding gastric ulcers-erosion and gastric fundus with stigmata of recent bleeding which was clipped-multiple diffuse erosions without bleeding in the first portion of duodenum- 2 small nonbleeding Mallory Weiss tears and gastric cardia -Have discussed with her that she needs to stop drinking-also takes occasional NSAIDs-advised to hold off on taking NSAIDs for now - tolerating solids- abdominal pain resolved -GI recommeds BID Protonix for 2 wks and then daily- Dr Fuller Plan will arrange f/u in office  Active Problems:   Acute blood loss Symptomatic anemia -2 units packed red blood cells transfused for hemoglobin of 7.3-hemoglobin in 9-10 range now   Hypokalemia- Hypomagnesemia -Replaced   Hypertension/ LVH noted on EKG - 160s/ 100s on 3/22  (stress?)-  Still moderately high today- LVH on EKG- start Atenolol- recommend outpt ECHO  HR issues - subsequently elevated in 140s- 150s- very short burst of SVT last night and a short episode of bradycardia this AM (nursing thinks bradycardia was from lead that were not placed properly) - asymptomatic- starting Atenolol- needs ECHO  Low-grade fever -99 on 3/21 and on 100.9 on 3.22 -Complained of dysuria but UA is negative -Chest x-ray negative -No symptoms of infection -No leukocytosis -Fevers resolved as of yesterday without any intervention  Elevated LFTs/ Thrombocytopenia - 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 Biopsied.  - Bleeding erosive gastropathy. Clip was placed.  - Duodenal erosions without bleeding.  - Two small non-bleeding Mallory-Weiss tears in the   gastric cardia  - Otherwise normal examination.  Consultations:  GI  Discharge Exam: Filed Weights   04/17/15 2100 04/18/15 0400 04/19/15 0525  Weight: 50.485 kg (111 lb 4.8 oz) 50.984 kg (112 lb 6.4 oz) 49.986 kg (110 lb 3.2 oz)  Filed Vitals:   04/19/15 0525 04/19/15 1211  BP: 153/97 148/77  Pulse: 88   Temp: 98.2 F (36.8 C)   Resp: 18     General: AAO x 3, no  distress Cardiovascular: RRR, no murmurs  Respiratory: clear to auscultation bilaterally GI: soft, non-tender, non-distended, bowel sound positive  Discharge Instructions You were cared for by a hospitalist during your hospital stay. If you have any questions about your discharge medications or the care you received while you were in the hospital after you are discharged, you can call the unit and asked to speak with the hospitalist on call if the hospitalist that took care of you is not available. Once you are discharged, your primary care physician will handle any further medical issues. Please note that NO REFILLS for any discharge medications will be authorized once you are discharged, as it is imperative that you return to your primary care physician (or establish a relationship with a primary care physician if you do not have one) for your aftercare needs so that they can reassess your need for medications and monitor your lab values.      Discharge Instructions    Diet - low sodium heart healthy    Complete by:  As directed      Discharge instructions    Complete by:  As directed   You need to stop drinking alcohol and smoking Do not take Ibuprofen, Naprosyn, Naproxen, Aspirin, Goody powders for now.  You need to find a primary care doctor.     Increase activity slowly    Complete by:  As directed             Medication List    STOP taking these medications        ibuprofen 200 MG tablet  Commonly known as:  ADVIL,MOTRIN      TAKE these medications        atenolol 25 MG tablet  Commonly known as:  TENORMIN  Take 1 tablet (25 mg total) by mouth daily.     EYE DROPS OP  Place 1 drop into both eyes daily. In the evening     feeding supplement Liqd  Take 1 Container by mouth 3 (three) times daily between meals.     pantoprazole 40 MG tablet  Commonly known as:  PROTONIX  Take 1 tablet (40 mg total) by mouth 2 (two) times daily. BID for 8 wks and then daily        No Known Allergies    The results of significant diagnostics from this hospitalization (including imaging, microbiology, ancillary and laboratory) are listed below for reference.    Significant Diagnostic Studies: Dg Chest 2 View  04/16/2015  CLINICAL DATA:  Weakness, dizziness, vomiting, epigastric pain EXAM: CHEST  2 VIEW COMPARISON:  None. FINDINGS: Cardiomediastinal silhouette is unremarkable. No acute infiltrate or pleural effusion. No pulmonary edema. Mild degenerative changes thoracic spine. IMPRESSION: No active cardiopulmonary disease. Mild degenerative changes thoracic spine. Electronically Signed   By: Lahoma Crocker M.D.   On: 04/16/2015 17:26   US Abdomen Limited Ruq  04/18/2015  CLINICAL DATA:  Cirrhosis EXAM: US ABDOMEN LIMITED - RIGHT UPPER QUADRANT COMPARISON:  None. FINDINGS: Gallbladder: No gallstones seen. There is no gallbladder wall thickening, pericholecystic fluid or other secondary sonographic signs of acute cholecystitis. No sonographic Murphy's sign elicited during the examination, per the sonographer. Common bile duct: Diameter: Normal at 3 mm. Liver: No focal mass or lesion identified within the liver. Liver echotexture  slightly heterogeneous and echogenic suggesting fatty infiltration, possible cirrhosis. IMPRESSION: 1. Liver echotexture slightly heterogeneous and echogenic suggesting fatty infiltration and possible cirrhosis. No focal mass or lesion identified in the liver. 2. Gallbladder appears normal.  No evidence of cholecystitis. 3. No bile duct dilatation. Electronically Signed   By: Franki Cabot M.D.   On: 04/18/2015 21:40    Microbiology: No results found for this or any previous visit (from the past 240 hour(s)).   Labs: Basic Metabolic Panel:  Recent Labs Lab 04/16/15 1512 04/16/15 1518 04/17/15 0439 04/18/15 0516 04/18/15 1435  NA 142  --  138 132* 133*  K 2.7*  --  4.1 2.9* 4.7  CL 100*  --  107 98* 100*  CO2 29  --  19* 25 23  GLUCOSE 130*   --  106* 144* 167*  BUN 30*  --  23* 9 9  CREATININE 1.01*  --  0.66 0.59 0.69  CALCIUM 7.7*  --  7.0* 7.4* 7.8*  MG  --  1.0*  --  1.6* 2.7*   Liver Function Tests:  Recent Labs Lab 04/16/15 1512 04/17/15 0439 04/18/15 0516  AST 50* 43* 29  ALT 20 18 15   ALKPHOS 60 52 47  BILITOT 2.9* 2.7* 3.2*  PROT 5.8* 5.4* 5.5*  ALBUMIN 3.4* 3.2* 3.1*    Recent Labs Lab 04/17/15 0023  LIPASE 18   No results for input(s): AMMONIA in the last 168 hours. CBC:  Recent Labs Lab 04/16/15 1512 04/17/15 0023 04/17/15 0446 04/17/15 1157 04/17/15 1654 04/18/15 0516  WBC 7.4 6.3 7.1 6.7 7.9 9.1  NEUTROABS 5.7  --   --   --   --   --   HGB 7.3* 9.3* 9.5* 9.8* 10.1* 9.0*  HCT 21.7* 26.8* 26.9* 29.2* 29.5* 25.7*  MCV 100.0 90.5 90.0 94.5 94.9 90.2  PLT 64* 34* 71* 52* 64* 58*   Cardiac Enzymes: No results for input(s): CKTOTAL, CKMB, CKMBINDEX, TROPONINI in the last 168 hours. BNP: BNP (last 3 results) No results for input(s): BNP in the last 8760 hours.  ProBNP (last 3 results) No results for input(s): PROBNP in the last 8760 hours.  CBG: No results for input(s): GLUCAP in the last 168 hours.     SignedDebbe Odea, MD [facility] 04/19/2015, 12:21 PM

## 2015-04-18 NOTE — Progress Notes (Signed)
Initial Nutrition Assessment  DOCUMENTATION CODES:   Non-severe (moderate) malnutrition in context of acute illness/injury  INTERVENTION:  - Will order Boost Breeze BID, each supplement provides 250 kcal and 9 grams of protein - Encourage PO intakes of meals and supplements - Monitor magnesium, potassium, and phosphorus daily for at least 3 days, MD to replete as needed, as pt is at risk for refeeding syndrome given hx of alcohol abuse, inconsistent intakes PTA, low K and Mg 3/23. - RD will continue to monitor for needs related to diet advancement and intakes   NUTRITION DIAGNOSIS:   Inadequate protein intake related to other (see comment) (current diet order) as evidenced by other (see comment) (CLD does not meet estimated protein needs).  GOAL:   Patient will meet greater than or equal to 90% of their needs  MONITOR:   PO intake, Supplement acceptance, Diet advancement, Weight trends, Labs, I & O's  REASON FOR ASSESSMENT:   Malnutrition Screening Tool  ASSESSMENT:   70 y.o. female with Past medical history of alcohol use. The patient presented with complaints of vomiting of blood. She mentions that since last 3 days she has been having complaints of upper abdominal pain as well as vomiting of the blood. The patient had 2 episodes a day for last 3 days. Today her pain was significantly severe and therefore she decided to come to the hospital. She denies having any prior episodes of vomiting of blood. She mentions she takes ibuprofen every now and then. She told me that she drinks once a week although she told the GI attending that the patient has been drinking once every day. She drinks 3 glasses of gin. Prior to this event the patient had drink more than her usual glasses.  Pt seen for MST. BMI indicates normal weight status. No intakes documented since admission. Pt reports that she ate breakfast this AM which consisted of jello and chicken broth. Diet advanced from CLD to 2 gram  Na since breakfast. She states that PTA her appetite greatly fluctuated from day to day. Some days she would eat 2 larger meals and a smaller meal, some days she would only eat 1 meal, and some days she would not eat or drink anything. Pt states that this has been ongoing since summer 2016. Pt denies chewing or swallowing issues or abdominal pain or nausea with intakes now or PTA.   Pt reports UBW of 120 lbs and states that she has lost 90 (ninety) pounds since summer 2016. No previous weight hx available in the chart. Based on reported UBW, pt has lost 8 lbs (7% body weight), possible since summer 2016 but unable to confirm time frame. Physical assessment indicates mild fat wasting to upper body and moderate muscle wasting to upper body.   Pt not meeting needs at this time. Talked with pt about Boost Breeze which she is interested in trying. Will order BID. Not meeting needs currently.  Pt had EGD 3/22 AM which showed: Two non-bleeding cratered gastric ulcers with no stigmata of bleeding  were found in the gastric antrum. The largest lesion was 7 mm in largest dimension. Biopsies were taken with a cold forceps for histology. Estimated blood loss was minimal. Two small non-bleeding Mallory-Weiss tears in the gastric cardia. A single localized, 4 mm bleeding linear erosion was found in the gastric fundus.  Medications reviewed. Labs reviewed; Na: 132 mmol/L, K: 2.9 mmol/L, Cl: 98 mmol/L, Ca: 7.4 mg/dL, Mg: 1.6 mg/dL.   Diet Order:  Diet  2 gram sodium Room service appropriate?: Yes; Fluid consistency:: Thin Diet - low sodium heart healthy  Skin:  Reviewed, no issues  Last BM:  3/20  Height:   Ht Readings from Last 1 Encounters:  04/18/15 5\' 1"  (1.549 m)    Weight:   Wt Readings from Last 1 Encounters:  04/18/15 112 lb 6.4 oz (50.984 kg)    Ideal Body Weight:  47.73 kg (kg)  BMI:  Body mass index is 21.25 kg/(m^2).  Estimated Nutritional Needs:   Kcal:  1300-1500  Protein:  55-65  grams  Fluid:  >/= 1.8 L/day  EDUCATION NEEDS:   No education needs identified at this time     Jarome Matin, RD, LDN Inpatient Clinical Dietitian Pager # [phone] After hours/weekend pager # [phone]

## 2015-04-19 DIAGNOSIS — I10 Essential (primary) hypertension: Secondary | ICD-10-CM

## 2015-04-19 DIAGNOSIS — K922 Gastrointestinal hemorrhage, unspecified: Secondary | ICD-10-CM

## 2015-04-19 DIAGNOSIS — K7460 Unspecified cirrhosis of liver: Secondary | ICD-10-CM | POA: Insufficient documentation

## 2015-04-19 MED ORDER — PANTOPRAZOLE SODIUM 40 MG PO TBEC: 40.0000 mg | DELAYED_RELEASE_TABLET | Freq: Two times a day (BID) | ORAL | Status: DC

## 2015-04-19 MED ORDER — ATENOLOL 25 MG PO TABS: 25.0000 mg | ORAL_TABLET | Freq: Every day | ORAL | Status: DC

## 2015-04-19 MED ORDER — AMLODIPINE BESYLATE 5 MG PO TABS: 5.0000 mg | ORAL_TABLET | Freq: Every day | ORAL | Status: DC

## 2015-04-19 MED ORDER — BOOST / RESOURCE BREEZE PO LIQD: 1.0000 | Freq: Three times a day (TID) | ORAL | Status: DC

## 2015-04-19 NOTE — Care Management Important Message (Signed)
Important Message  Patient Details  Name: DONTA SCHWARM MRN: [id number] Date of Birth: September 21, 1945   Medicare Important Message Given:  Yes    Camillo Flaming 04/19/2015, 11:52 AMImportant Message  Patient Details  Name: RABEKA BOUNDY MRN: [id number] Date of Birth: Dec 05, 1945   Medicare Important Message Given:  Yes    Camillo Flaming 04/19/2015, 11:52 AM

## 2015-04-19 NOTE — Progress Notes (Signed)
Patient ID: Kylie Torres, female   DOB: 06/06/1945, 70 y.o.   MRN: [id number]    Progress Note   Subjective    No specific complaints, eating though not much appetite, some upper abdominal discomfort   Objective   Vital signs in last 24 hours: Temp:  [98.2 F (36.8 C)-98.9 F (37.2 C)] 98.2 F (36.8 C) (03/24 0525) Pulse Rate:  [80-88] 88 (03/24 0525) Resp:  [18-20] 18 (03/24 0525) BP: (122-153)/(60-97) 153/97 mmHg (03/24 0525) SpO2:  [93 %-100 %] 100 % (03/24 0525) Weight:  [110 lb 3.2 oz (49.986 kg)] 110 lb 3.2 oz (49.986 kg) (03/24 0525) Last BM Date: 04/15/15 General:    Older AA female in NAD Heart:  Regular rate and rhythm; no murmurs Lungs: Respirations even and unlabored, lungs CTA bilaterally Abdomen:  Soft, muldly tender epigastrium and nondistended. Normal bowel sounds. Extremities:  Without edema. Neurologic:  Alert and oriented,  grossly normal neurologically. Psych:  Cooperative. Normal mood and affect.  Intake/Output from previous day: 03/23 0701 - 03/24 0700 In: 300 [P.O.:300] Out: 500 [Urine:500] Intake/Output this shift:    Lab Results:  Recent Labs  04/17/15 1157 04/17/15 1654 04/18/15 0516  WBC 6.7 7.9 9.1  HGB 9.8* 10.1* 9.0*  HCT 29.2* 29.5* 25.7*  PLT 52* 64* 58*   BMET  Recent Labs  04/17/15 0439 04/18/15 0516 04/18/15 1435  NA 138 132* 133*  K 4.1 2.9* 4.7  CL 107 98* 100*  CO2 19* 25 23  GLUCOSE 106* 144* 167*  BUN 23* 9 9  CREATININE 0.66 0.59 0.69  CALCIUM 7.0* 7.4* 7.8*   LFT  Recent Labs  04/18/15 0516  PROT 5.5*  ALBUMIN 3.1*  AST 29  ALT 15  ALKPHOS 47  BILITOT 3.2*   PT/INR  Recent Labs  04/16/15 1512 04/17/15 0439  LABPROT 15.0 15.1  INR 1.16 1.18    Studies/Results: US Abdomen Limited Ruq  04/18/2015  CLINICAL DATA:  Cirrhosis EXAM: US ABDOMEN LIMITED - RIGHT UPPER QUADRANT COMPARISON:  None. FINDINGS: Gallbladder: No gallstones seen. There is no gallbladder wall thickening, pericholecystic fluid  or other secondary sonographic signs of acute cholecystitis. No sonographic Murphy's sign elicited during the examination, per the sonographer. Common bile duct: Diameter: Normal at 3 mm. Liver: No focal mass or lesion identified within the liver. Liver echotexture slightly heterogeneous and echogenic suggesting fatty infiltration, possible cirrhosis. IMPRESSION: 1. Liver echotexture slightly heterogeneous and echogenic suggesting fatty infiltration and possible cirrhosis. No focal mass or lesion identified in the liver. 2. Gallbladder appears normal.  No evidence of cholecystitis. 3. No bile duct dilatation. Electronically Signed   By: Franki Cabot M.D.   On: 04/18/2015 21:40       Assessment / Plan:     #1 70 yo AA female alcoholic admitted with acute UGI bleed - stable with no active bleeding - 2 small Mallory Weiss tears, erosive gastritis  and 2 nonbleeding gastric ulcers on EGD Bx negative for dysplasia and H pylori negative   #2 fatty changes/possible cirrhosis on [place] - by labs she has persistent thrombocytopenia, and hyperbilirubinemia consistent with cirrhosis or myelosuppression from chronic ETOH abuse  #3 ETOH withdrawal  Plan: Diet as tolerated BID PPI x 1 month, then PPI daily indefinitely Complete ETOH withdrawal Avoid ASA/NSAIDS long term Stop ETOH long term GI will sign off, available if can help  Principal Problem:   Hematemesis Active Problems:   Hypotension   Alcohol use (HCC)   Hypokalemia   Hyperbilirubinemia  Thrombocytopenia (HCC)   UGIB (upper gastrointestinal bleed)   Acute gastric ulcer   Gastric erosion with bleeding   Malnutrition of moderate degree   Acute blood loss anemia    LOS: 3 days   Amy Esterwood  04/19/2015, 10:26 AM     Attending physician's note   I have taken an interval history, reviewed the chart and examined the patient. I agree with the Advanced Practitioner's note, impression and recommendations. 2 GUs, erosive gastritis and 2  MW tears. Avoid ASA/NSAIDs long term. Avoid etoh long term. PPI bid for 1 month and the PPI qam indefinitely. Outpatient follow up with PCP. GI signing off.   Lucio Edward, MD [facility] [phone] Mon-Fri 8a-5p [phone] after 5p, weekends, holidays

## 2015-04-19 NOTE — Evaluation (Signed)
Physical Therapy One Time Evaluation Patient Details Name: Kylie Torres MRN: [id number] DOB: 17-Mar-1945 Today's Date: 04/19/2015   History of Present Illness  70 y.o. female with a history of alcohol abuse, smoking admitted for upper GI bleed and acute blood loss anemia  Clinical Impression  Patient evaluated by Physical Therapy with no further acute PT needs identified. All education has been completed and the patient has no further questions.  Pt ambulated in hallway min/guard assist and performed 10 steps using handrail.  Family in room and report they plan to take turns assisting pt for about a week upon d/c.  Pt has been medically discharged so see below for any follow-up Physical Therapy or equipment needs. PT is signing off. Thank you for this referral.    Follow Up Recommendations Home health PT    Equipment Recommendations  Cane;3in1 (PT);Other (comment) (family requesting shower chair)    Recommendations for Other Services       Precautions / Restrictions Precautions Precautions: Fall      Mobility  Bed Mobility Overal bed mobility: Modified Independent                Transfers Overall transfer level: Needs assistance Equipment used: None Transfers: Sit to/from Stand Sit to Stand: Supervision            Ambulation/Gait Ambulation/Gait assistance: Min guard;Supervision Ambulation Distance (Feet): 160 Feet Assistive device: 1 person hand held assist Gait Pattern/deviations: Step-through pattern;Decreased stride length     General Gait Details: ambulated first 30 feet without UE support however feeling fatigued and a little unsteady requesting HHA, no overt LOB observed, SpO2 100% room air and HR 88 bpm upon returning to room  Stairs Stairs: Yes Stairs assistance: Min guard Stair Management: Step to pattern;Alternating pattern;Forwards;One rail Right Number of Stairs: 10 General stair comments: initially step to pattern however able to perform  alternating, R rail ascending and rail and HHA upon descending   Wheelchair Mobility    Modified Rankin (Stroke Patients Only)       Balance                                             Pertinent Vitals/Pain Pain Assessment: No/denies pain    Home Living Family/patient expects to be discharged to:: Private residence Living Arrangements: Alone Available Help at Discharge: Family;Available 24 hours/day (family plans to assist upon d/c for a week) Type of Home: House       Home Layout: Two level Home Equipment: None      Prior Function Level of Independence: Independent               Hand Dominance        Extremity/Trunk Assessment               Lower Extremity Assessment: Generalized weakness         Communication   Communication: No difficulties  Cognition Arousal/Alertness: Awake/alert Behavior During Therapy: WFL for tasks assessed/performed Overall Cognitive Status: Within Functional Limits for tasks assessed                      General Comments      Exercises        Assessment/Plan    PT Assessment All further PT needs can be met in the next venue of care  PT Diagnosis Generalized weakness  PT Problem List Decreased strength;Decreased activity tolerance;Decreased mobility;Decreased knowledge of use of DME  PT Treatment Interventions     PT Goals (Current goals can be found in the Care Plan section) Acute Rehab PT Goals PT Goal Formulation: All assessment and education complete, DC therapy    Frequency     Barriers to discharge        Co-evaluation               End of Session   Activity Tolerance: Patient limited by fatigue Patient left: in bed;with call bell/phone within reach;with family/visitor present           Time: 0047-6737 PT Time Calculation (min) (ACUTE ONLY): 11 min   Charges:   PT Evaluation $PT Eval Low Complexity: 1 Procedure     PT G Codes:         Kylie Torres,KATHrine E 04/19/2015, 4:03 PM Carmelia Bake, PT, DPT 04/19/2015 Pager: [phone]

## 2015-04-19 NOTE — Progress Notes (Signed)
The patient was not able to be discharged yesterday. I have examined her today and discussed the plan.  She is stable for d/c today. See my d/c summary from 3/23.   Debbe Odea, MD

## 2015-04-19 NOTE — Progress Notes (Signed)
Patient and family notified of discharge.  Patient educated on discharge medications, follow-up appointment, and discharge instructions.  CM educated patient on how to get a PCP and left paperwork.  HHPT ordered for patient through [place] and family notified, verbalized understanding.  AVS signed.  IV removed.  Patient and family  gathered belongings.  Prescriptions sent electronically.  Escorted to ride via wheelchair.

## 2015-04-19 NOTE — Care Management Note (Signed)
Case Management Note  Patient Details  Name: Kylie Torres MRN: [id number] Date of Birth: July 13, 1945  Subjective/Objective:  Received message from [place] that PT recc HHPT. Spoke to nsg since already had d/c order in prior to PT cons-now needs HHPT, f27f order. [facility] will help patient get pcp, but may be unable to provide [facility] since patient will stay w/dtr [place] [id number]. Now patient will stay in [place].& [facility] will be able to provide [place].                    Action/Plan:d/c home in [place] w/HHC.   Expected Discharge Date:   (unknown)               Expected Discharge Plan:  [place]  In-House Referral:     Discharge planning Services  CM Consult  Post Acute Care Choice:    Choice offered to:  Patient  DME Arranged:    DME Agency:  [place]:    [place] Agency:     Status of Service:  Completed, signed off  Medicare Important Message Given:  Yes Date Medicare IM Given:    Medicare IM give by:    Date Additional Medicare IM Given:    Additional Medicare Important Message give by:     If discussed at [place] of Stay Meetings, dates discussed:    Additional Comments:  Dessa Phi, RN 04/19/2015, 4:31 PM

## 2015-04-19 NOTE — Discharge Instructions (Signed)
Take this paperwork to your next family doctor appointment. Have them request records from [facility]. You will need an ECHO to be done.

## 2015-04-19 NOTE — Care Management Note (Signed)
Case Management Note  Patient Details  Name: NYKIA BELLMAN MRN: [id number] Date of Birth: July 20, 1945  Subjective/Objective: Received call from nsg about 3n1-informed nurse that there is no order,its considered custodial level, & its out of pocket pay-they can get 1 @ [place].Nsg voiced understanding.                   Action/Plan:d/c home.   Expected Discharge Date:   (unknown)               Expected Discharge Plan:  [place]  In-House Referral:     Discharge planning Services  CM Consult  Post Acute Care Choice:    Choice offered to:  Patient  DME Arranged:    DME Agency:  [place]:    [place] Agency:     Status of Service:  Completed, signed off  Medicare Important Message Given:  Yes Date Medicare IM Given:    Medicare IM give by:    Date Additional Medicare IM Given:    Additional Medicare Important Message give by:     If discussed at [place] of Stay Meetings, dates discussed:    Additional Comments:  Dessa Phi, RN 04/19/2015, 4:50 PM

## 2015-04-19 NOTE — Care Management Note (Signed)
Case Management Note  Patient Details  Name: KIMESHA BIRCHETT MRN: [id number] Date of Birth: 28-Apr-1945  Subjective/Objective: [facility] has accepted patient for [facility], they will get a pcp.                   Action/Plan:d/c home w/HHC.   Expected Discharge Date:   (unknown)               Expected Discharge Plan:  [place]  In-House Referral:     Discharge planning Services  CM Consult  Post Acute Care Choice:    Choice offered to:  Patient  DME Arranged:    DME Agency:  [place]:    [place] Agency:     Status of Service:  Completed, signed off  Medicare Important Message Given:  Yes Date Medicare IM Given:    Medicare IM give by:    Date Additional Medicare IM Given:    Additional Medicare Important Message give by:     If discussed at [place] of Stay Meetings, dates discussed:    Additional Comments:  Dessa Phi, RN 04/19/2015, 4:47 PM

## 2015-04-19 NOTE — Care Management Note (Signed)
Case Management Note  Patient Details  Name: Kylie Torres MRN: [id number] Date of Birth: 12-15-1945  Subjective/Objective: Provided patient w/pcp listing for pcp-she can make own appt.                   Action/Plan:d/c plan home.   Expected Discharge Date:   (unknown)               Expected Discharge Plan:  Home/Self Care  In-House Referral:     Discharge planning Services  CM Consult  Post Acute Care Choice:    Choice offered to:     DME Arranged:    DME Agency:     HH Arranged:    [place] Agency:     Status of Service:  Completed, signed off  Medicare Important Message Given:  Yes Date Medicare IM Given:    Medicare IM give by:    Date Additional Medicare IM Given:    Additional Medicare Important Message give by:     If discussed at [place] of Stay Meetings, dates discussed:    Additional Comments:  Dessa Phi, RN 04/19/2015, 1:07 PM

## 2015-04-19 NOTE — Progress Notes (Signed)
CSW spoke with patient & daughters at bedside re: discharge planning & assessed patient's alcohol abuse. Patient is agreeable to outpatient treatment. CSW provided list of substance abuse treatment options - both residential and outpatient. Patient's daughters would prefer that she go to residential, but patient is not agreeable.   No further CSW needs identified - CSW signing off.   Raynaldo Opitz, [facility] Clinical Social Worker cell #: [phone]

## 2015-04-29 DIAGNOSIS — E440 Moderate protein-calorie malnutrition: Secondary | ICD-10-CM | POA: Diagnosis not present

## 2015-04-29 DIAGNOSIS — Z8719 Personal history of other diseases of the digestive system: Secondary | ICD-10-CM | POA: Diagnosis not present

## 2015-04-29 DIAGNOSIS — I10 Essential (primary) hypertension: Secondary | ICD-10-CM | POA: Diagnosis not present

## 2015-04-29 DIAGNOSIS — E876 Hypokalemia: Secondary | ICD-10-CM | POA: Diagnosis not present

## 2015-04-29 DIAGNOSIS — M6281 Muscle weakness (generalized): Secondary | ICD-10-CM | POA: Diagnosis not present

## 2015-04-30 DIAGNOSIS — R7303 Prediabetes: Secondary | ICD-10-CM | POA: Diagnosis not present

## 2015-04-30 DIAGNOSIS — K279 Peptic ulcer, site unspecified, unspecified as acute or chronic, without hemorrhage or perforation: Secondary | ICD-10-CM | POA: Diagnosis not present

## 2015-04-30 DIAGNOSIS — Z0000 Encounter for general adult medical examination without abnormal findings: Secondary | ICD-10-CM | POA: Diagnosis not present

## 2015-04-30 DIAGNOSIS — D500 Iron deficiency anemia secondary to blood loss (chronic): Secondary | ICD-10-CM | POA: Diagnosis not present

## 2015-04-30 DIAGNOSIS — Z720 Tobacco use: Secondary | ICD-10-CM | POA: Diagnosis not present

## 2015-04-30 DIAGNOSIS — Z131 Encounter for screening for diabetes mellitus: Secondary | ICD-10-CM | POA: Diagnosis not present

## 2015-05-03 DIAGNOSIS — E876 Hypokalemia: Secondary | ICD-10-CM | POA: Diagnosis not present

## 2015-05-03 DIAGNOSIS — I10 Essential (primary) hypertension: Secondary | ICD-10-CM | POA: Diagnosis not present

## 2015-05-03 DIAGNOSIS — Z8719 Personal history of other diseases of the digestive system: Secondary | ICD-10-CM | POA: Diagnosis not present

## 2015-05-03 DIAGNOSIS — E440 Moderate protein-calorie malnutrition: Secondary | ICD-10-CM | POA: Diagnosis not present

## 2015-05-03 DIAGNOSIS — M6281 Muscle weakness (generalized): Secondary | ICD-10-CM | POA: Diagnosis not present

## 2015-05-06 DIAGNOSIS — Z8719 Personal history of other diseases of the digestive system: Secondary | ICD-10-CM | POA: Diagnosis not present

## 2015-05-06 DIAGNOSIS — I10 Essential (primary) hypertension: Secondary | ICD-10-CM | POA: Diagnosis not present

## 2015-05-06 DIAGNOSIS — E440 Moderate protein-calorie malnutrition: Secondary | ICD-10-CM | POA: Diagnosis not present

## 2015-05-06 DIAGNOSIS — E876 Hypokalemia: Secondary | ICD-10-CM | POA: Diagnosis not present

## 2015-05-06 DIAGNOSIS — M6281 Muscle weakness (generalized): Secondary | ICD-10-CM | POA: Diagnosis not present

## 2015-05-07 DIAGNOSIS — D500 Iron deficiency anemia secondary to blood loss (chronic): Secondary | ICD-10-CM | POA: Diagnosis not present

## 2015-05-07 DIAGNOSIS — Z0000 Encounter for general adult medical examination without abnormal findings: Secondary | ICD-10-CM | POA: Diagnosis not present

## 2015-05-07 DIAGNOSIS — Z720 Tobacco use: Secondary | ICD-10-CM | POA: Diagnosis not present

## 2015-05-07 DIAGNOSIS — K279 Peptic ulcer, site unspecified, unspecified as acute or chronic, without hemorrhage or perforation: Secondary | ICD-10-CM | POA: Diagnosis not present

## 2015-05-07 DIAGNOSIS — R7303 Prediabetes: Secondary | ICD-10-CM | POA: Diagnosis not present

## 2015-05-07 DIAGNOSIS — Z136 Encounter for screening for cardiovascular disorders: Secondary | ICD-10-CM | POA: Diagnosis not present

## 2015-05-10 DIAGNOSIS — M6281 Muscle weakness (generalized): Secondary | ICD-10-CM | POA: Diagnosis not present

## 2015-05-10 DIAGNOSIS — E876 Hypokalemia: Secondary | ICD-10-CM | POA: Diagnosis not present

## 2015-05-10 DIAGNOSIS — E440 Moderate protein-calorie malnutrition: Secondary | ICD-10-CM | POA: Diagnosis not present

## 2015-05-10 DIAGNOSIS — Z8719 Personal history of other diseases of the digestive system: Secondary | ICD-10-CM | POA: Diagnosis not present

## 2015-05-10 DIAGNOSIS — I10 Essential (primary) hypertension: Secondary | ICD-10-CM | POA: Diagnosis not present

## 2015-05-21 DIAGNOSIS — H2513 Age-related nuclear cataract, bilateral: Secondary | ICD-10-CM | POA: Diagnosis not present

## 2015-05-23 DIAGNOSIS — I10 Essential (primary) hypertension: Secondary | ICD-10-CM | POA: Diagnosis not present

## 2015-05-23 DIAGNOSIS — Z136 Encounter for screening for cardiovascular disorders: Secondary | ICD-10-CM | POA: Diagnosis not present

## 2015-05-23 DIAGNOSIS — E785 Hyperlipidemia, unspecified: Secondary | ICD-10-CM | POA: Diagnosis not present

## 2015-05-23 DIAGNOSIS — Z720 Tobacco use: Secondary | ICD-10-CM | POA: Diagnosis not present

## 2015-05-23 DIAGNOSIS — D509 Iron deficiency anemia, unspecified: Secondary | ICD-10-CM | POA: Diagnosis not present

## 2015-05-23 DIAGNOSIS — E7211 Homocystinuria: Secondary | ICD-10-CM | POA: Diagnosis not present

## 2015-06-10 DIAGNOSIS — H2513 Age-related nuclear cataract, bilateral: Secondary | ICD-10-CM | POA: Diagnosis not present

## 2015-06-14 DIAGNOSIS — I517 Cardiomegaly: Secondary | ICD-10-CM | POA: Diagnosis not present

## 2015-06-14 DIAGNOSIS — I10 Essential (primary) hypertension: Secondary | ICD-10-CM | POA: Diagnosis not present

## 2015-06-14 DIAGNOSIS — R0602 Shortness of breath: Secondary | ICD-10-CM | POA: Diagnosis not present

## 2015-06-20 DIAGNOSIS — H2511 Age-related nuclear cataract, right eye: Secondary | ICD-10-CM | POA: Diagnosis not present

## 2015-06-26 DIAGNOSIS — I10 Essential (primary) hypertension: Secondary | ICD-10-CM | POA: Diagnosis not present

## 2015-06-26 DIAGNOSIS — E785 Hyperlipidemia, unspecified: Secondary | ICD-10-CM | POA: Diagnosis not present

## 2015-06-26 DIAGNOSIS — D509 Iron deficiency anemia, unspecified: Secondary | ICD-10-CM | POA: Diagnosis not present

## 2015-06-26 DIAGNOSIS — E7211 Homocystinuria: Secondary | ICD-10-CM | POA: Diagnosis not present

## 2015-06-26 DIAGNOSIS — R931 Abnormal findings on diagnostic imaging of heart and coronary circulation: Secondary | ICD-10-CM | POA: Diagnosis not present

## 2015-07-15 DIAGNOSIS — H2512 Age-related nuclear cataract, left eye: Secondary | ICD-10-CM | POA: Diagnosis not present

## 2015-07-18 DIAGNOSIS — H2512 Age-related nuclear cataract, left eye: Secondary | ICD-10-CM | POA: Diagnosis not present

## 2015-08-01 ENCOUNTER — Other Ambulatory Visit: Payer: Self-pay | Admitting: Internal Medicine

## 2015-08-01 DIAGNOSIS — Z13820 Encounter for screening for osteoporosis: Secondary | ICD-10-CM

## 2015-08-01 DIAGNOSIS — I10 Essential (primary) hypertension: Secondary | ICD-10-CM | POA: Diagnosis not present

## 2015-08-01 DIAGNOSIS — Z01118 Encounter for examination of ears and hearing with other abnormal findings: Secondary | ICD-10-CM | POA: Diagnosis not present

## 2015-08-01 DIAGNOSIS — Z0000 Encounter for general adult medical examination without abnormal findings: Secondary | ICD-10-CM | POA: Diagnosis not present

## 2015-08-01 DIAGNOSIS — H538 Other visual disturbances: Secondary | ICD-10-CM | POA: Diagnosis not present

## 2015-08-01 DIAGNOSIS — D500 Iron deficiency anemia secondary to blood loss (chronic): Secondary | ICD-10-CM | POA: Diagnosis not present

## 2015-08-13 ENCOUNTER — Other Ambulatory Visit: Payer: Self-pay | Admitting: Internal Medicine

## 2015-08-13 DIAGNOSIS — E2839 Other primary ovarian failure: Secondary | ICD-10-CM

## 2015-08-19 DIAGNOSIS — I422 Other hypertrophic cardiomyopathy: Secondary | ICD-10-CM | POA: Diagnosis not present

## 2015-08-19 DIAGNOSIS — I10 Essential (primary) hypertension: Secondary | ICD-10-CM | POA: Diagnosis not present

## 2015-08-19 DIAGNOSIS — Q248 Other specified congenital malformations of heart: Secondary | ICD-10-CM | POA: Diagnosis not present

## 2015-08-19 DIAGNOSIS — R9431 Abnormal electrocardiogram [ECG] [EKG]: Secondary | ICD-10-CM | POA: Diagnosis not present

## 2015-08-20 DIAGNOSIS — I739 Peripheral vascular disease, unspecified: Secondary | ICD-10-CM | POA: Diagnosis not present

## 2015-08-26 DIAGNOSIS — R9431 Abnormal electrocardiogram [ECG] [EKG]: Secondary | ICD-10-CM | POA: Diagnosis not present

## 2015-09-03 DIAGNOSIS — R9431 Abnormal electrocardiogram [ECG] [EKG]: Secondary | ICD-10-CM | POA: Diagnosis not present

## 2015-09-03 DIAGNOSIS — I422 Other hypertrophic cardiomyopathy: Secondary | ICD-10-CM | POA: Diagnosis not present

## 2015-09-03 DIAGNOSIS — I10 Essential (primary) hypertension: Secondary | ICD-10-CM | POA: Diagnosis not present

## 2015-09-03 DIAGNOSIS — Q248 Other specified congenital malformations of heart: Secondary | ICD-10-CM | POA: Diagnosis not present

## 2015-09-17 DIAGNOSIS — I10 Essential (primary) hypertension: Secondary | ICD-10-CM | POA: Diagnosis not present

## 2015-10-24 ENCOUNTER — Encounter: Payer: Medicare Other | Admitting: Internal Medicine

## 2015-11-06 DIAGNOSIS — I422 Other hypertrophic cardiomyopathy: Secondary | ICD-10-CM | POA: Diagnosis not present

## 2015-11-06 DIAGNOSIS — R9431 Abnormal electrocardiogram [ECG] [EKG]: Secondary | ICD-10-CM | POA: Diagnosis not present

## 2015-11-06 DIAGNOSIS — Q248 Other specified congenital malformations of heart: Secondary | ICD-10-CM | POA: Diagnosis not present

## 2015-11-06 DIAGNOSIS — I10 Essential (primary) hypertension: Secondary | ICD-10-CM | POA: Diagnosis not present

## 2015-11-22 ENCOUNTER — Encounter: Payer: Self-pay | Admitting: Internal Medicine

## 2015-11-22 DIAGNOSIS — I429 Cardiomyopathy, unspecified: Secondary | ICD-10-CM | POA: Insufficient documentation

## 2015-11-28 ENCOUNTER — Encounter: Payer: Medicare Other | Admitting: Internal Medicine

## 2015-12-04 ENCOUNTER — Ambulatory Visit (INDEPENDENT_AMBULATORY_CARE_PROVIDER_SITE_OTHER): Payer: Medicare Other | Admitting: Internal Medicine

## 2015-12-04 ENCOUNTER — Encounter: Payer: Self-pay | Admitting: Internal Medicine

## 2015-12-04 VITALS — BP 183/79 | HR 54 | Temp 97.4°F | Wt 121.8 lb

## 2015-12-04 DIAGNOSIS — R000 Tachycardia, unspecified: Secondary | ICD-10-CM

## 2015-12-04 DIAGNOSIS — I739 Peripheral vascular disease, unspecified: Secondary | ICD-10-CM | POA: Diagnosis not present

## 2015-12-04 DIAGNOSIS — K267 Chronic duodenal ulcer without hemorrhage or perforation: Secondary | ICD-10-CM

## 2015-12-04 DIAGNOSIS — K7030 Alcoholic cirrhosis of liver without ascites: Secondary | ICD-10-CM

## 2015-12-04 DIAGNOSIS — Z0000 Encounter for general adult medical examination without abnormal findings: Secondary | ICD-10-CM

## 2015-12-04 DIAGNOSIS — Z23 Encounter for immunization: Secondary | ICD-10-CM | POA: Diagnosis not present

## 2015-12-04 DIAGNOSIS — Z833 Family history of diabetes mellitus: Secondary | ICD-10-CM

## 2015-12-04 DIAGNOSIS — Z7289 Other problems related to lifestyle: Secondary | ICD-10-CM

## 2015-12-04 DIAGNOSIS — K257 Chronic gastric ulcer without hemorrhage or perforation: Secondary | ICD-10-CM

## 2015-12-04 DIAGNOSIS — G4700 Insomnia, unspecified: Secondary | ICD-10-CM

## 2015-12-04 DIAGNOSIS — K7460 Unspecified cirrhosis of liver: Secondary | ICD-10-CM | POA: Diagnosis not present

## 2015-12-04 DIAGNOSIS — Z8249 Family history of ischemic heart disease and other diseases of the circulatory system: Secondary | ICD-10-CM

## 2015-12-04 DIAGNOSIS — F17200 Nicotine dependence, unspecified, uncomplicated: Secondary | ICD-10-CM

## 2015-12-04 DIAGNOSIS — Z809 Family history of malignant neoplasm, unspecified: Secondary | ICD-10-CM

## 2015-12-04 DIAGNOSIS — Z90722 Acquired absence of ovaries, bilateral: Secondary | ICD-10-CM

## 2015-12-04 DIAGNOSIS — Z789 Other specified health status: Secondary | ICD-10-CM

## 2015-12-04 DIAGNOSIS — I429 Cardiomyopathy, unspecified: Secondary | ICD-10-CM

## 2015-12-04 DIAGNOSIS — K254 Chronic or unspecified gastric ulcer with hemorrhage: Secondary | ICD-10-CM

## 2015-12-04 DIAGNOSIS — Z90710 Acquired absence of both cervix and uterus: Secondary | ICD-10-CM

## 2015-12-04 DIAGNOSIS — F17210 Nicotine dependence, cigarettes, uncomplicated: Secondary | ICD-10-CM

## 2015-12-04 DIAGNOSIS — I10 Essential (primary) hypertension: Secondary | ICD-10-CM

## 2015-12-04 DIAGNOSIS — Z79899 Other long term (current) drug therapy: Secondary | ICD-10-CM

## 2015-12-04 DIAGNOSIS — F1021 Alcohol dependence, in remission: Secondary | ICD-10-CM

## 2015-12-04 MED ORDER — PANTOPRAZOLE SODIUM 40 MG PO TBEC: 40.0000 mg | DELAYED_RELEASE_TABLET | Freq: Every day | ORAL | 4 refills | Status: DC

## 2015-12-04 MED ORDER — METOPROLOL TARTRATE 25 MG PO TABS: 25.0000 mg | ORAL_TABLET | Freq: Two times a day (BID) | ORAL | 11 refills | Status: DC

## 2015-12-04 MED ORDER — VALSARTAN-HYDROCHLOROTHIAZIDE 160-12.5 MG PO TABS: 1.0000 | ORAL_TABLET | Freq: Every day | ORAL | 0 refills | Status: DC

## 2015-12-04 MED ORDER — CILOSTAZOL 100 MG PO TABS: 100.0000 mg | ORAL_TABLET | Freq: Two times a day (BID) | ORAL | 0 refills | Status: DC

## 2015-12-04 MED ORDER — ZOSTER VACCINE LIVE 19400 UNT/0.65ML ~~LOC~~ SUSR: 0.6500 mL | Freq: Once | SUBCUTANEOUS | 0 refills | Status: AC

## 2015-12-04 MED ORDER — AMLODIPINE BESYLATE 5 MG PO TABS: 5.0000 mg | ORAL_TABLET | Freq: Every day | ORAL | 11 refills | Status: DC

## 2015-12-04 MED ORDER — ATORVASTATIN CALCIUM 20 MG PO TABS: 20.0000 mg | ORAL_TABLET | Freq: Every day | ORAL | 11 refills | Status: DC

## 2015-12-04 NOTE — Progress Notes (Signed)
CC: HTN  HPI: Ms.Eddye H Malsch is a 70 y.o. female with a past medical history of Upper GI bleed secondary to ETOH abuse, Cirrhosis, HTN here today to establish care.   Gastric and Duodenal Ulcers with erosive gastropathy: Admitted to the hospital 04/16/15-04/16/15. Underwent EGD that showed two non-bleeding gastric ulcers with no stigmata of bleeding. Biopsied. Bleeding erosive gastropathy with Clip placed. Duodenal erosions without bleeding. Two small non-bleeding Mallory-Weiss tears in the gastric cardia. Has ETOH abuse history and occasional NSAID use. Was told to stop NSAIDs at that time, advised to stop drinking and started on Protonix 40 mg bid x 2 weeks, then daily thereafter. Was suppose to follow up with GI but has never done so. Biopsy negative for any malignancy. Negative for H.Pylori.  HTN: LVH noted on EKG. She was in the 160s/100s on 3/17 admission. Started on atenolol at discharge. Seen by Dr. Einar Gip, Cardiology. Stopped her atenolol and started her on metoprolol 25 mg bid, valsatran-hctz 160-12.5 mg daily and amlodipine 5 mg daily. BP elevated to 183/87 on arrival today. Pulse 58. Does not check her blood pressure at home. No headache, no vision changes, nausea/vomiting. Feels lightheaded/dizzy with standing occasionally. Goes away after a few seconds.   Cardiomyopathy: ECHO in May 2017 with LVH and normal LV EF. Seen by [facility] on 11/06/2015. Patient underwent Lexiscan myoview stress test on 08/26/2015: Initially attempted treadmill exercise stress test but unable to complete due to dysnpea and fatigue. Switched to pharmacologic stress test. Rest EKG showed NSR and LVH. Myocardial perfusion imaging was normal. Overall LV EF was normal without regional wall motion abnormalities. LV EF was 64%.  Tachycardia: Had HR elevations in the 140-150s during 3/17 hospitalizations. Had some episodes of SVT noted on telemetry. Says she has some palpitations 1-2 a months. Heart beating too  fast. No other symptoms. Lasts for a few seconds and goes away.   Cirrhosis: Hepatic ultrasound suggestive of cirrhosis. No focal mass or lesion identified in the liver. Had elevated LFTs (AST 50: ALT 20) and thrombocytopenia (plts 50-70 during prior hospitalization). Likely secondary to ETOH abuse.   ETOH abuse: Use to drink heavily prior to her hospitalization in March. Drank a 1/5 of gin every other day. 3-4 cups of gin daily. Started drinking heavily in her 49s. Has not had any alcohol since March.   Insomnia: Sleeping 4 hours a night. No daytime sleeping. Goes to bed at variable times. Takes 1-2 hours to fall asleep at night, tossing and turning. Wakes up after 4 hours and and has difficulty getting back to sleep. May sleep for another 1-2 hours. Falls asleep watching TV. Sometime has soft drinks in the afternoons. Does not sleep on the couch, sleeps in bed alone. Has become a problem since she stopped drinking.    Smoker: Smoking 5 cigarettes a day. Working on cutting down. Smoked since she was in high school, 20. Smoked 1/2 PPD for 50 years. Cutting back on her own. Never used anything to help quit. Interested in help quitting. Has cravings. No history of seizures.   PAD: LE arterial duplex 10/2015 showed right profunda femoral artery with >50% stenosis, left posterior tibial artery with >50% stenosis and RABI of 0.65 and LABI of 0.58. Refused invasive therapy. Started on cilostazol. Still smoking. Reports continued pain with walking.   Health Care Maintaince: Flu shot today. Prevnar 13 today. Tdap booster today.   Past Medical History:  Diagnosis Date  . Alcohol abuse   . Blood transfusion  without reported diagnosis   . Cirrhosis ([place])   . Gastric ulcer   . Hypertension    Past Surgical History:  Procedure Laterality Date  . ABDOMINAL HYSTERECTOMY    . BREAST LUMPECTOMY    . ESOPHAGOGASTRODUODENOSCOPY (EGD) WITH PROPOFOL N/A 04/17/2015   Procedure: ESOPHAGOGASTRODUODENOSCOPY (EGD)  WITH PROPOFOL;  Surgeon: Ladene Artist, MD;  Location: [facility] ENDOSCOPY;  Service: Endoscopy;  Laterality: N/A;  . OOPHORECTOMY     Family History  Problem Relation Age of Onset  . Cancer Mother   . Heart attack Father   . Hypertension Father   . Cancer Sister   . Hypertension Sister   . Diabetes Sister   . Cancer Maternal Aunt    Social History   Social History  . Marital status: Divorced    Spouse name: N/A  . Number of children: N/A  . Years of education: N/A   Occupational History  . Not on file.   Social History Main Topics  . Smoking status: Current Every Day Smoker    Packs/day: 0.50    Years: 50.00    Types: Cigarettes  . Smokeless tobacco: Never Used  . Alcohol use No     Comment: quit drinking in 03/2015 - previously 1/5 gin every 2 days  . Drug use: No  . Sexual activity: Not on file   Other Topics Concern  . Not on file   Social History Narrative  . No narrative on file   Review of Systems:   Review of Systems  Constitutional: Negative for chills, fever and weight loss.  Respiratory: Positive for wheezing. Negative for sputum production.   Cardiovascular: Positive for palpitations and claudication. Negative for chest pain, orthopnea and leg swelling.  Gastrointestinal: Negative for abdominal pain, blood in stool, heartburn, melena, nausea and vomiting.  Genitourinary: Negative for dysuria.  Neurological: Negative for dizziness.  Psychiatric/Behavioral: Negative for depression. The patient has insomnia.    Physical Exam:  Vitals:   12/04/15 1420  BP: (!) 183/87  Pulse: (!) 58  Temp: 97.4 F (36.3 C)  TempSrc: Oral  SpO2: 100%  Weight: 121 lb 12.8 oz (55.2 kg)   General: alert, well-developed, and cooperative to examination.  Head: normocephalic and atraumatic.  Eyes: vision grossly intact, pupils equal, pupils round, pupils reactive to light, no injection and anicteric.  Mouth: pharynx pink and moist, no erythema, and no exudates.  Neck:  supple, full ROM, no thyromegaly, no JVD, and no carotid bruits.  Lungs: normal respiratory effort, no accessory muscle use, normal breath sounds, no crackles, and no wheezes. Heart: normal rate, regular rhythm, no murmur, no gallop, and no rub.  Abdomen: soft, non-tender, normal bowel sounds, no distention, no guarding, no rebound tenderness, no hepatomegaly, and no splenomegaly.  Msk: no joint swelling, no joint warmth, and no redness over joints.  Pulses: 2+ DP/PT pulses bilaterally Extremities: No cyanosis, clubbing, edema Neurologic: alert & oriented X3, cranial nerves II-XII intact, strength normal in all extremities, sensation intact to light touch, and gait normal.  Skin: turgor normal and no rashes.  Psych: Oriented X3, memory intact for recent and remote, normally interactive, good eye contact, not anxious appearing, and not depressed appearin   Assessment & Plan:   See Encounters Tab for problem based charting.  Patient discussed with Dr. Daryll Drown

## 2015-12-04 NOTE — Patient Instructions (Addendum)
Ms. Contino,  It was a pleasure meeting you today.  Your blood pressure is very elevated today. I would like to get you down to less that 140 on the top number and less than 90 on the bottom number. You were 180/87 today. Please take your blood pressure medications every day. See your attached medication list on how to take them. If you have any questions or concerns please let us know. Please check your blood pressures at home and keep a log and bring it to [place] at your next visit.   I am going to get you referred over to the stomach doctors who did your endoscopy in the hospital for follow up. Please avoid taking any NSAIDs that can worsen your gastric ulcers (list below).  I am going to set you up to have a mammogram done as well.    I would like to see you back in 4-6 weeks for follow up.    The following list is an example of NSAIDs available:  celecoxib (Celebrex)  diclofenac (Cambia, Cataflam, Voltaren-XR, Zipsor, Zorvolex)  diflunisal (Dolobid - discontinued brand)  etodolac (Lodine - discontinued brand)  ibuprofen (Motrin, Advil)  indomethacin (Indocin)

## 2015-12-05 LAB — CMP14 + ANION GAP
ALK PHOS: 163 IU/L — AB (ref 39–117)
ALT: 12 IU/L (ref 0–32)
AST: 18 IU/L (ref 0–40)
Albumin/Globulin Ratio: 1.4 (ref 1.2–2.2)
Albumin: 4.3 g/dL (ref 3.6–4.8)
Anion Gap: 18 mmol/L (ref 10.0–18.0)
BILIRUBIN TOTAL: 0.7 mg/dL (ref 0.0–1.2)
BUN/Creatinine Ratio: 21 (ref 12–28)
BUN: 14 mg/dL (ref 8–27)
CHLORIDE: 104 mmol/L (ref 96–106)
CO2: 20 mmol/L (ref 18–29)
Calcium: 9.8 mg/dL (ref 8.7–10.3)
Creatinine, Ser: 0.67 mg/dL (ref 0.57–1.00)
GFR calc non Af Amer: 91 mL/min/{1.73_m2} (ref >59)
GFR, EST AFRICAN AMERICAN: 104 mL/min/{1.73_m2} (ref >59)
GLUCOSE: 90 mg/dL (ref 65–99)
Globulin, Total: 3 g/dL (ref 1.5–4.5)
Potassium: 4.1 mmol/L (ref 3.5–5.2)
Sodium: 142 mmol/L (ref 134–144)
TOTAL PROTEIN: 7.3 g/dL (ref 6.0–8.5)

## 2015-12-05 LAB — CBC
HEMATOCRIT: 38.4 % (ref 34.0–46.6)
Hemoglobin: 13 g/dL (ref 11.1–15.9)
MCH: 29.1 pg (ref 26.6–33.0)
MCHC: 33.9 g/dL (ref 31.5–35.7)
MCV: 86 fL (ref 79–97)
PLATELETS: 193 10*3/uL (ref 150–379)
RBC: 4.46 x10E6/uL (ref 3.77–5.28)
RDW: 16 % — ABNORMAL HIGH (ref 12.3–15.4)
WBC: 8.6 10*3/uL (ref 3.4–10.8)

## 2015-12-05 LAB — HIV ANTIBODY (ROUTINE TESTING W REFLEX): HIV Screen 4th Generation wRfx: NONREACTIVE

## 2015-12-05 LAB — HEPATITIS C ANTIBODY

## 2015-12-06 DIAGNOSIS — I739 Peripheral vascular disease, unspecified: Secondary | ICD-10-CM | POA: Insufficient documentation

## 2015-12-06 DIAGNOSIS — F17200 Nicotine dependence, unspecified, uncomplicated: Secondary | ICD-10-CM | POA: Insufficient documentation

## 2015-12-06 DIAGNOSIS — Z0000 Encounter for general adult medical examination without abnormal findings: Secondary | ICD-10-CM | POA: Insufficient documentation

## 2015-12-06 DIAGNOSIS — R000 Tachycardia, unspecified: Secondary | ICD-10-CM | POA: Insufficient documentation

## 2015-12-06 DIAGNOSIS — G4700 Insomnia, unspecified: Secondary | ICD-10-CM | POA: Insufficient documentation

## 2015-12-06 MED ORDER — NICOTINE 7 MG/24HR TD PT24: 7.0000 mg | MEDICATED_PATCH | TRANSDERMAL | 0 refills | Status: DC

## 2015-12-06 MED ORDER — NICOTINE POLACRILEX 2 MG MT GUM: 2.0000 mg | CHEWING_GUM | OROMUCOSAL | Status: DC | PRN

## 2015-12-06 NOTE — Assessment & Plan Note (Signed)
BP Readings from Last 3 Encounters:  12/04/15 (!) 183/79  04/19/15 (!) 148/77  02/12/12 135/85    Lab Results  Component Value Date   NA 142 12/04/2015   K 4.1 12/04/2015   CREATININE 0.67 12/04/2015    Assessment: LVH noted on EKG. She was in the 160s/100s on 3/17 admission. Started on atenolol at discharge. Seen by Dr. Einar Gip, Cardiology. Stopped her atenolol and started her on metoprolol 25 mg bid, valsatran-hctz 160-12.5 mg daily and amlodipine 5 mg daily. BP elevated to 183/87 on arrival today. Pulse 58. Does not check her blood pressure at home. No headache, no vision changes, nausea/vomiting. Feels lightheaded/dizzy with standing occasionally. Goes away after a few seconds.   Plan: She is currently on 4 antihypertensive medications. It is unclear how compliant with her medications she actually is. Her sister with her today says that she filled her medications several weeks ago and her pill bottles today are nearly full.  Will continue current medications today. Asked her to check her BP at home and keep a log. Follow up in 4-6 weeks.   BMET today unremarkable.

## 2015-12-06 NOTE — Assessment & Plan Note (Signed)
Had HR elevations in the 140-150s during 3/17 hospitalizations. Had some episodes of SVT noted on telemetry. Says she has some palpitations 1-2 a months. Heart beating too fast. No other symptoms. Lasts for a few seconds and goes away.   Plan Will get records from her cardiology visit. Likely needs event monitor.

## 2015-12-06 NOTE — Assessment & Plan Note (Signed)
Smoking 5 cigarettes a day. Working on cutting down. Smoked since she was in high school, 20. Smoked 1/2 PPD for 50 years. Cutting back on her own. Never used anything to help quit. Interested in help quitting. Has cravings. No history of seizures.   Plan Will give Rx for nicotine patches and gum today

## 2015-12-06 NOTE — Assessment & Plan Note (Signed)
Use to drink heavily prior to her hospitalization in March. Drank a 1/5 of gin every other day. 3-4 cups of gin daily. Started drinking heavily in her 45s. Has not had any alcohol since March.   Plan Continue to encourage cessation

## 2015-12-06 NOTE — Assessment & Plan Note (Signed)
LE arterial duplex 10/2015 showed right profunda femoral artery with >50% stenosis, left posterior tibial artery with >50% stenosis and RABI of 0.65 and LABI of 0.58. Refused invasive therapy. Started on cilostazol. Still smoking. Reports continued pain with walking.   Plan Continue cilostazol 100 mg bid. Follow up with cardiology. Encourage smoking cessation.

## 2015-12-06 NOTE — Progress Notes (Signed)
Internal Medicine Clinic Attending  Case discussed with Dr. Boswell soon after the resident saw the patient.  We reviewed the resident's history and exam and pertinent patient test results.  I agree with the assessment, diagnosis, and plan of care documented in the resident's note. 

## 2015-12-06 NOTE — Assessment & Plan Note (Addendum)
ECHO in May 2017 with LVH and normal LV EF. Seen by [facility] Cardiology on 11/06/2015. Patient underwent Lexiscan myoview stress test on 08/26/2015: Initially attempted treadmill exercise stress test but unable to complete due to dysnpea and fatigue. Switched to pharmacologic stress test. Rest EKG showed NSR and LVH. Myocardial perfusion imaging was normal. Overall LV EF was normal without regional wall motion abnormalities. LV EF was 64%.  Presumably 2/2 history of uncontrolled HTN  Plan  Will attempt to get records from [facility]iology Continue antihypertensives.

## 2015-12-06 NOTE — Assessment & Plan Note (Signed)
Hepatic ultrasound suggestive of cirrhosis. No focal mass or lesion identified in the liver. Had elevated LFTs (AST 50: ALT 20) and thrombocytopenia (plts 50-70 during prior hospitalization). Likely secondary to ETOH abuse.   Plan Patient has stopped drinking Checking HCV today Repeat CMP  HCV negative.  Plts improved to 163 today.

## 2015-12-06 NOTE — Assessment & Plan Note (Signed)
Admitted to the hospital 04/16/15-04/16/15. Underwent EGD that showed two non-bleeding gastric ulcers with no stigmata of bleeding. Biopsied. Bleeding erosive gastropathy with Clip placed. Duodenal erosions without bleeding. Two small non-bleeding Mallory-Weiss tears in the gastric cardia. Has ETOH abuse history and occasional NSAID use. Was told to stop NSAIDs at that time, advised to stop drinking and started on Protonix 40 mg bid x 2 weeks, then daily thereafter. Was suppose to follow up with GI but has never done so. Biopsy negative for any malignancy. Negative for H.Pylori.  Plan Will refer back to GI for follow up Change Protonix to daily CBC today  Hgb 13.0 today.

## 2015-12-06 NOTE — Assessment & Plan Note (Signed)
Sleeping 4 hours a night. No daytime sleeping. Goes to bed at variable times. Takes 1-2 hours to fall asleep at night, tossing and turning. Wakes up after 4 hours and and has difficulty getting back to sleep. May sleep for another 1-2 hours. Falls asleep watching TV. Sometime has soft drinks in the afternoons. Does not sleep on the couch, sleeps in bed alone. Has become a problem since she stopped drinking.    Plan Discussed sleep hygiene today

## 2015-12-06 NOTE — Assessment & Plan Note (Addendum)
Flu shot today.  Prevnar 13 today. Will need 23 in a year.  Tdap booster today. Gave Rx for Shingles vaccine today.   HCV and HIV negative today.   Has family history of breast cancer and a lumpectomy in the past. Will refer for mammogram today.   Will need colonoscopy and DEXA in the future. Reports has had a total hysterectomy with oophorectomy in the past. Denies any history of abnormal PAP smears.

## 2015-12-11 ENCOUNTER — Encounter: Payer: Self-pay | Admitting: Internal Medicine

## 2015-12-23 ENCOUNTER — Encounter: Payer: Self-pay | Admitting: Physician Assistant

## 2015-12-23 ENCOUNTER — Other Ambulatory Visit (INDEPENDENT_AMBULATORY_CARE_PROVIDER_SITE_OTHER): Payer: Medicare Other

## 2015-12-23 ENCOUNTER — Ambulatory Visit (INDEPENDENT_AMBULATORY_CARE_PROVIDER_SITE_OTHER): Payer: Medicare Other | Admitting: Physician Assistant

## 2015-12-23 ENCOUNTER — Ambulatory Visit
Admission: RE | Admit: 2015-12-23 | Discharge: 2015-12-23 | Disposition: A | Discharge status: Home / Self Care | Payer: Medicare Other | Source: Ambulatory Visit | Attending: Internal Medicine | Admitting: Internal Medicine

## 2015-12-23 VITALS — BP 128/74 | HR 68 | Ht 60.25 in | Wt 121.5 lb

## 2015-12-23 DIAGNOSIS — K7469 Other cirrhosis of liver: Secondary | ICD-10-CM

## 2015-12-23 DIAGNOSIS — K259 Gastric ulcer, unspecified as acute or chronic, without hemorrhage or perforation: Secondary | ICD-10-CM

## 2015-12-23 DIAGNOSIS — Z1231 Encounter for screening mammogram for malignant neoplasm of breast: Secondary | ICD-10-CM | POA: Diagnosis not present

## 2015-12-23 DIAGNOSIS — K254 Chronic or unspecified gastric ulcer with hemorrhage: Secondary | ICD-10-CM

## 2015-12-23 DIAGNOSIS — Z0000 Encounter for general adult medical examination without abnormal findings: Secondary | ICD-10-CM

## 2015-12-23 DIAGNOSIS — Z1211 Encounter for screening for malignant neoplasm of colon: Secondary | ICD-10-CM

## 2015-12-23 LAB — PROTIME-INR
INR: 1 ratio (ref 0.8–1.0)
PROTHROMBIN TIME: 10.8 s (ref 9.6–13.1)

## 2015-12-23 MED ORDER — NA SULFATE-K SULFATE-MG SULF 17.5-3.13-1.6 GM/177ML PO SOLN: 1.0000 | Freq: Once | ORAL | 0 refills | Status: AC

## 2015-12-23 MED ORDER — PANTOPRAZOLE SODIUM 40 MG PO TBEC: DELAYED_RELEASE_TABLET | ORAL | 6 refills | Status: DC

## 2015-12-23 NOTE — Progress Notes (Signed)
Reviewed and agree with management plan.  Halie Gass T. Avielle Imbert, MD FACG 

## 2015-12-23 NOTE — Progress Notes (Signed)
Subjective:    Patient ID: Kylie Torres, female    DOB: September 13, 1945, 70 y.o.   MRN: [id number]  HPI Kylie Torres is a 70 year old African-American female, new to the office today who was seen in consult during hospitalization in March 2017 by Dr. Fuller Plan. She was admitted with an acute GI bleed and on EGD found to have 2 nonbleeding gastric ulcers leading erosive gastropathy which was clipped duodenal erosions and a small Mallory-Weiss tear. No esophageal varices. CLOtest and was negative for H. pylori. Patient was to follow-up with GI, but did not follow through. She was recently seen by internal medicine at [facility] and referred for follow-up. She has history of hypertension, cardiomyopathy, peripheral arterial disease and EtOH abuse. At the time of hospitalization she was found to have cirrhosis Upper abdominal ultrasound showed a fatty liver versus cirrhosis Hepatitis C antibody is negative HIV negative. She did have a mild coagulopathy and thrombocytopenia both of which have improved. Line Patient admits to heavy daily alcohol use for many years but says she stopped drinking after hospitalization in March. Has had some issues with depression and her mood but has been able to stay away from alcohol. She has no current complaints of abdominal discomfort has been eating well and has gained weight. He has had no difficulty with fluid retention. No melena or hematochezia. She had been taking Goody powders on a regular basis which she has stopped. She is taking Protonix 40 mg by mouth daily. She was recently started on Pletal. No prior colonoscopy Most recent labs 12/04/2015 showed normal liver function studies with the exception of alkaline phosphatase of 163, creatinine 0.67 platelets 193 and hemoglobin of 13.  Review of Systems Pertinent positive and negative review of systems were noted in the above HPI section.  All other review of systems was otherwise negative.  Outpatient Encounter Prescriptions as of  12/23/2015  Medication Sig  . amLODipine (NORVASC) 5 MG tablet Take 1 tablet (5 mg total) by mouth daily.  Marland Kitchen atorvastatin (LIPITOR) 20 MG tablet Take 1 tablet (20 mg total) by mouth daily.  . cilostazol (PLETAL) 100 MG tablet Take 1 tablet (100 mg total) by mouth 2 (two) times daily.  . feeding supplement (BOOST / RESOURCE BREEZE) LIQD Take 1 Container by mouth 3 (three) times daily between meals.  . metoprolol tartrate (LOPRESSOR) 25 MG tablet Take 1 tablet (25 mg total) by mouth 2 (two) times daily.  . nicotine (NICODERM CQ - DOSED IN MG/24 HR) 7 mg/24hr patch Place 1 patch (7 mg total) onto the skin daily.  . pantoprazole (PROTONIX) 40 MG tablet Take 1 tab every daily in the morning.  . valsartan-hydrochlorothiazide (DIOVAN-HCT) 160-12.5 MG tablet Take 1 tablet by mouth daily.  . [DISCONTINUED] pantoprazole (PROTONIX) 40 MG tablet Take 1 tablet (40 mg total) by mouth daily. BID for 8 wks and then daily  . Na Sulfate-K Sulfate-Mg Sulf 17.5-3.13-1.6 GM/180ML SOLN Take 1 kit by mouth once.   Facility-Administered Encounter Medications as of 12/23/2015  Medication  . nicotine polacrilex (NICORETTE) gum 2 mg   No Known Allergies Patient Active Problem List   Diagnosis Date Noted  . PAD (peripheral artery disease) ([place]) 12/06/2015  . Tachycardia 12/06/2015  . Insomnia 12/06/2015  . Smoker 12/06/2015  . Health care maintenance 12/06/2015  . Cardiomyopathy ([place]) 11/22/2015  . HTN (hypertension) 04/19/2015  . Cirrhosis ([place])   . Malnutrition of moderate degree 04/18/2015  . Gastric erosion with bleeding   . Alcohol use 04/16/2015  Social History   Social History  . Marital status: Divorced    Spouse name: N/A  . Number of children: 3  . Years of education: N/A   Occupational History  . retired    Social History Main Topics  . Smoking status: Current Every Day Smoker    Packs/day: 0.50    Years: 50.00    Types: Cigarettes  . Smokeless tobacco: Never Used  . Alcohol use No       Comment: quit drinking in 03/2015 - previously 1/5 gin every 2 days  . Drug use: No  . Sexual activity: Not on file   Other Topics Concern  . Not on file   Social History Narrative  . No narrative on file    Kylie Torres's family history includes Breast cancer in her maternal aunt, mother, and sister; Diabetes in her sister; Heart attack in her father; Hypertension in her father and sister.      Objective:    Vitals:   12/23/15 1353  BP: 128/74  Pulse: 68    Physical Exam;Well-developed older African-American female in no acute distress, accompanied by her sister blood pressure 128/74 pulse 68, height 5 foot, weight 121, BMI 23.5. HEENT ;nontraumatic normocephalic EOMI PERRLA sclera anicteric, Cardiovascular ;regular rate and rhythm with harsh  systolic murmur Pulmonary; clear bilaterally, Abdomen soft nontender nondistended bowel sounds are active no palpable hepatosplenomegaly, no appreciable ascites Rectal ;exam not done, Extremities ;no clubbing cyanosis or edema skin warm and dry, Neuropsych; mood and affect appropriate       Assessment & Plan:   #60 70 year old African-American female alcoholic hospitalized March 2017 with acute GI bleed secondary to 2 gastric ulcers and bleeding erosive gastropathy. No esophageal varices found at EGD. 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-, no ascites, esophageal varices, or current coagulopathy- #3 history of cardiomyopathy #4 peripheral arterial disease-on Pletal #5 hypertension #6 colon cancer screening no prior colonoscopy  Plan; continue Protonix 40 mg by mouth every morning Strongly encouraged continued long-term complete avoidance of alcohol and she was commended for stopping alcohol over the past 8 months Avoid aspirin and NSAIDs Check AFP level, and INR Will  need follow-up ultrasound spring 2017 Patient should have colonoscopy for screening and procedure was discussed in detail with the patient including risks and benefits today. Should also have follow-up EGD to document healing of her gastric ulcers and would need follow-up within the next few months for screening for varices. We will schedule for EGD and colonoscopy with Dr. Fuller Plan in January 2018. We'll hold Pletal for 5 days prior to procedure, and confirmwith her PCP that this is reasonable for this patient.    Delano Frate S Nozomi Mettler PA-C 12/23/2015   Cc: Maryellen Pile, MD

## 2015-12-23 NOTE — Patient Instructions (Addendum)
Please go to the basement level to have your labs drawn.  Continue the Pantoprazole sodium 40 mg. We sent refills to your pharmacy.  You have been scheduled for an endoscopy and colonoscopy. Please follow the written instructions given to you at your visit today. Please pick up your prep supplies at the pharmacy within the next 1-3 days. Pharmacy [facility] [place] [facility] [place].  If you use inhalers (even only as needed), please bring them with you on the day of your procedure. Your physician has requested that you go to www.startemmi.com and enter the access code given to you at your visit today. This web site gives a general overview about your procedure. However, you should still follow specific instructions given to you by our office regarding your preparation for the procedure.

## 2015-12-24 LAB — AFP TUMOR MARKER: AFP-Tumor Marker: 4.7 ng/mL (ref <6.1)

## 2016-01-14 NOTE — Progress Notes (Deleted)
   CC: ***  HPI:  [name] is a 70 y.o.   HTN - BP *** today. Currently on metoprolol 25 mg bid, valsatran-hctz 160-12.5 mg daily and amlodipine 5 mg daily.  Room to increase valsartan, hctz and amlodipine.   PAD - Currently on Cilostazol 100 mg bid. Continues to smoke.   Gastric Erosion with Bleeding - Followed up with GI on 11/27. Advised continuation of Protonix 40 mg daily. Avoidance of NSAIDs and ASA and ETOH. Scheduled for EGD in January for both resolution of her gastric ulcers and screening for varices given her ETOH cirrhosis. GI asked to hold Pletal 5 days prior to procedure. Believe this would be reasonable and will advise patient to hold her Peltal 5 days prior to going in for the EGD.   Compensated Alcoholic Cirrhosis - GI advised follow up ultrasound in spring 2017.   Tachycardia - ? Follow up with cards. ? Event monitor. ? Recurrent palpitations  Smoker - Continues to smoke.  Insomnia -   ETOH abuse - Remains abstinent. Congratulated patient on this accomplishment.   Health Care Maintenance - Will have colonoscopy in January at time of EGD. Mammogram done 11/27 with no evidence of any malignancy.   Past Medical History:  Diagnosis Date  . Alcohol abuse   . Blood transfusion without reported diagnosis   . Cirrhosis ([place])   . Gastric ulcer   . Hypertension     Review of Systems:  ***  Physical Exam:  There were no vitals filed for this visit. ***  Assessment & Plan:   See Encounters Tab for problem based charting.  Patient {GC/GE:3044014::"discussed with","seen with"} Dr. {NAMES:3044014::"Butcher","Granfortuna","E. Hoffman","Klima","Mullen","Narendra","Vincent"}

## 2016-01-15 ENCOUNTER — Encounter: Payer: Medicare Other | Admitting: Internal Medicine

## 2016-02-17 ENCOUNTER — Telehealth: Payer: Self-pay | Admitting: *Deleted

## 2016-02-17 ENCOUNTER — Other Ambulatory Visit: Payer: Self-pay | Admitting: *Deleted

## 2016-02-17 NOTE — Telephone Encounter (Signed)
  02/17/2016   RE: TEDRA SELZLER DOB: March 10, 1945 MRN: [id number]   Dear Dr. Maryellen Pile,    We have scheduled the above patient for an endoscopic procedure. Our records show that she is on anticoagulation therapy.   Please advise as to how long the patient may come off her therapy of Pletal prior to the procedure, which is scheduled for 03-23-2016.  Please route the Pletal clearance instructions to [facility] CMA .   Sincerely,    Amy Esterwood PA-C

## 2016-02-17 NOTE — Telephone Encounter (Signed)
I called the patient to ask her if she happened to stop her Pletal blood thinner this weekend.  She would have needed to stop it on Sat 20th .  She said she did not. She really didn't quite understand.  I spoke to her caregiver and explained and apologized that I did not contact  Dr. Maryellen Pile who prescribes the Pletal.   We rescheduled with the patient's and caregiver's permission  her procedures to 03-23-2016 at 2;30 PM. .  I also advised and did mail out the new instructions for the procedures.  I also mailed out a [employer] release with a note on how to fill it out. We need this so we can speak to her caregiver or whoever else she wants to give [place] legal permission to talk to.

## 2016-02-19 ENCOUNTER — Encounter: Payer: Medicare Other | Admitting: Gastroenterology

## 2016-02-26 ENCOUNTER — Encounter: Payer: Medicare Other | Admitting: Internal Medicine

## 2016-03-06 ENCOUNTER — Telehealth: Payer: Self-pay | Admitting: *Deleted

## 2016-03-06 NOTE — Telephone Encounter (Signed)
/  22/2018   RE: Kylie Torres DOB: 1945-02-01 MRN: [id number]   Dear Dr. Maryellen Pile,    We have scheduled the above patient for an endoscopic procedure. Our records show that she is on anticoagulation therapy.   Please advise as to how long the patient may come off her therapy of Pletal prior to the procedure, which is scheduled for 03-23-2016.  Please route the Pletal clearance instructions to [facility] CMA .   Sincerely,    Amy Esterwood PA-C

## 2016-03-09 NOTE — Telephone Encounter (Signed)
Kylie Torres is currently on Cilostazol for intermittent claudication symptoms. Given the low risk of bleeding with her endoscopic procedure, she may discontinue the cilostazol 24 hours prior to procedure and resume it 12-24 hours post-procedure. Please contact me if you have any further questions.   Thanks, Kylie Pile, MD

## 2016-03-12 NOTE — Telephone Encounter (Signed)
Called to talk to patient and had to call her son, Skyeler Acoff, # [phone]. He said he will have her call us back about this matter, Pletal clearance for colonoscopy.  When I called her number, I am asked to put in an access code.

## 2016-03-17 NOTE — Telephone Encounter (Signed)
Called and spoke to son, he will have mother call the office for her instructions.

## 2016-03-17 NOTE — Telephone Encounter (Signed)
Spoke to patient informed her of instructions and she verbalized understanding.

## 2016-03-23 ENCOUNTER — Ambulatory Visit (AMBULATORY_SURGERY_CENTER): Payer: Medicare Other | Admitting: Gastroenterology

## 2016-03-23 ENCOUNTER — Encounter: Payer: Self-pay | Admitting: Gastroenterology

## 2016-03-23 VITALS — BP 177/99 | HR 66 | Temp 96.8°F | Resp 16 | Ht 61.0 in | Wt 121.0 lb

## 2016-03-23 DIAGNOSIS — Z1211 Encounter for screening for malignant neoplasm of colon: Secondary | ICD-10-CM | POA: Diagnosis not present

## 2016-03-23 DIAGNOSIS — Z1212 Encounter for screening for malignant neoplasm of rectum: Secondary | ICD-10-CM | POA: Diagnosis not present

## 2016-03-23 DIAGNOSIS — D123 Benign neoplasm of transverse colon: Secondary | ICD-10-CM | POA: Diagnosis not present

## 2016-03-23 DIAGNOSIS — K259 Gastric ulcer, unspecified as acute or chronic, without hemorrhage or perforation: Secondary | ICD-10-CM | POA: Diagnosis not present

## 2016-03-23 HISTORY — PX: COLONOSCOPY W/ POLYPECTOMY: SHX1380

## 2016-03-23 MED ORDER — SODIUM CHLORIDE 0.9 % IV SOLN: 500.0000 mL | INTRAVENOUS | Status: DC

## 2016-03-23 NOTE — Op Note (Signed)
[place] Patient Name: Kylie Torres Procedure Date: 03/23/2016 2:41 PM MRN: [id number] Endoscopist: Ladene Artist , MD Age: 71 Referring MD:  Date of Birth: May 01, 1945 Gender: Female Account #: [id number] Procedure:                Colonoscopy Indications:              Screening for colorectal malignant neoplasm Medicines:                Monitored Anesthesia Care Procedure:                Pre-Anesthesia Assessment:                           - Prior to the procedure, a History and Physical                            was performed, and patient medications and                            allergies were reviewed. The patient's tolerance of                            previous anesthesia was also reviewed. The risks                            and benefits of the procedure and the sedation                            options and risks were discussed with the patient.                            All questions were answered, and informed consent                            was obtained. Prior Anticoagulants: The patient has                            taken Pletal (cilostazol), last dose was 5 days                            prior to procedure. ASA Grade Assessment: III - A                            patient with severe systemic disease. After                            reviewing the risks and benefits, the patient was                            deemed in satisfactory condition to undergo the                            procedure.  After obtaining informed consent, the colonoscope                            was passed under direct vision. Throughout the                            procedure, the patient's blood pressure, pulse, and                            oxygen saturations were monitored continuously. The                            Model PCF-H190L [id number]) scope was introduced                            through the anus and advanced to the the cecum,                         identified by appendiceal orifice and ileocecal                            valve. The ileocecal valve, appendiceal orifice,                            and rectum were photographed. The quality of the                            bowel preparation was good. The colonoscopy was                            performed without difficulty. The patient tolerated                            the procedure well. Scope In: 2:50:24 PM Scope Out: 3:02:22 PM Scope Withdrawal Time: 0 hours 9 minutes 24 seconds  Total Procedure Duration: 0 hours 11 minutes 58 seconds  Findings:                 The perianal and digital rectal examinations were                            normal.                           A 6 mm polyp was found in the transverse colon. The                            polyp was sessile. The polyp was removed with a                            cold snare. Resection and retrieval were complete.                           Internal hemorrhoids were found during  retroflexion. The hemorrhoids were small and Grade                            I (internal hemorrhoids that do not prolapse).                           Multiple medium-mouthed diverticula were found in                            the left colon and right colon. There was no                            evidence of diverticular bleeding.                           The exam was otherwise without abnormality on                            direct and retroflexion views. Complications:            No immediate complications. Estimated blood loss:                            None. Estimated Blood Loss:     Estimated blood loss: none. Impression:               - One 6 mm polyp in the transverse colon, removed                            with a cold snare. Resected and retrieved.                           - Internal hemorrhoids.                           - Moderate diverticulosis in the left colon and in                             the right colon.                           - The examination was otherwise normal on direct                            and retroflexion views. Recommendation:           - Repeat colonoscopy in 5 years for surveillance if                            polyp is precancerous, otherwise 10 years for                            screening.                           - Resume Pletal (cilostazol) tomorrow at prior  dose. Refer to managing physician for further                            adjustment of therapy.                           - Patient has a contact number available for                            emergencies. The signs and symptoms of potential                            delayed complications were discussed with the                            patient. Return to normal activities tomorrow.                            Written discharge instructions were provided to the                            patient.                           - High fiber diet.                           - Continue present medications.                           - Await pathology results. Ladene Artist, MD 03/23/2016 3:12:19 PM This report has been signed electronically.

## 2016-03-23 NOTE — Op Note (Signed)
[place] Patient Name: Kylie Torres Procedure Date: 03/23/2016 2:41 PM MRN: [id number] Endoscopist: Ladene Artist , MD Age: 71 Referring MD:  Date of Birth: 1945-03-20 Gender: Female Account #: [id number] Procedure:                Upper GI endoscopy Indications:              Follow-up of gastric ulcer Medicines:                Monitored Anesthesia Care Procedure:                Pre-Anesthesia Assessment:                           - Prior to the procedure, a History and Physical                            was performed, and patient medications and                            allergies were reviewed. The patient's tolerance of                            previous anesthesia was also reviewed. The risks                            and benefits of the procedure and the sedation                            options and risks were discussed with the patient.                            All questions were answered, and informed consent                            was obtained. Prior Anticoagulants: The patient has                            taken Pletal (cilostazol), last dose was 5 days                            prior to procedure. ASA Grade Assessment: III - A                            patient with severe systemic disease. After                            reviewing the risks and benefits, the patient was                            deemed in satisfactory condition to undergo the                            procedure.  After obtaining informed consent, the endoscope was                            passed under direct vision. Throughout the                            procedure, the patient's blood pressure, pulse, and                            oxygen saturations were monitored continuously. The                            Model GIF-HQ190 [id number]) scope was introduced                            through the mouth, and advanced to the second part                of duodenum. The upper GI endoscopy was                            accomplished without difficulty. The patient                            tolerated the procedure well. Scope In: Scope Out: Findings:                 The examined esophagus was normal.                           A few localized, small non-bleeding erosions were                            found in the gastric antrum and in the prepyloric                            region of the stomach. There were no stigmata of                            recent bleeding.                           The exam of the stomach was otherwise normal. Prior                            ulcer have healed.                           A single diffuse erosion without bleeding was found                            in the duodenal bulb and in the second portion of                            the duodenum.  The exam of the duodenum was otherwise normal. Complications:            No immediate complications. Estimated Blood Loss:     Estimated blood loss: none. Impression:               - Normal esophagus.                           - Non-bleeding erosive gastropathy.                           - Duodenal erosion without bleeding.                           - No specimens collected. Recommendation:           - Patient has a contact number available for                            emergencies. The signs and symptoms of potential                            delayed complications were discussed with the                            patient. Return to normal activities tomorrow.                            Written discharge instructions were provided to the                            patient.                           - Resume previous diet.                           - Continue present medications including a PPI qam                            long term.                           - No aspirin, ibuprofen, naproxen, or other                             non-steroidal anti-inflammatory drugs long term.                           - Follow up with PCP. Ladene Artist, MD 03/23/2016 3:17:34 PM This report has been signed electronically.

## 2016-03-23 NOTE — Patient Instructions (Signed)
Impression/Recommendations:  Polyp handout given to patient. Hemorrhoid handout given to patient. Diverticulosis handout given to patient. High fiber diet handout given to patient.  Resume Pletal (cilostazol) tomorrow at prior dose. Continue present medications including Protonix every morning long term.  No Aspirin, Ibuprofen, naproxen, or other NSAIDs long term.   Tylenol only.   Repeat colonoscopy in 5-10 years for surveillance based on pathology results.  YOU HAD AN ENDOSCOPIC PROCEDURE TODAY AT [facility]:   Refer to the procedure report that was given to you for any specific questions about what was found during the examination.  If the procedure report does not answer your questions, please call your gastroenterologist to clarify.  If you requested that your care partner not be given the details of your procedure findings, then the procedure report has been included in a sealed envelope for you to review at your convenience later.  YOU SHOULD EXPECT: Some feelings of bloating in the abdomen. Passage of more gas than usual.  Walking can help get rid of the air that was put into your GI tract during the procedure and reduce the bloating. If you had a lower endoscopy (such as a colonoscopy or flexible sigmoidoscopy) you may notice spotting of blood in your stool or on the toilet paper. If you underwent a bowel prep for your procedure, you may not have a normal bowel movement for a few days.  Please Note:  You might notice some irritation and congestion in your nose or some drainage.  This is from the oxygen used during your procedure.  There is no need for concern and it should clear up in a day or so.  SYMPTOMS TO REPORT IMMEDIATELY:   Following lower endoscopy (colonoscopy or flexible sigmoidoscopy):  Excessive amounts of blood in the stool  Significant tenderness or worsening of abdominal pains  Swelling of the abdomen that is new, acute  Fever of 100F or  higher   Following upper endoscopy (EGD)  Vomiting of blood or coffee ground material  New chest pain or pain under the shoulder blades  Painful or persistently difficult swallowing  New shortness of breath  Fever of 100F or higher  Black, tarry-looking stools  For urgent or emergent issues, a gastroenterologist can be reached at any hour by calling [phone].   DIET:  We do recommend a small meal at first, but then you may proceed to your regular diet.  Drink plenty of fluids but you should avoid alcoholic beverages for 24 hours.  ACTIVITY:  You should plan to take it easy for the rest of today and you should NOT DRIVE or use heavy machinery until tomorrow (because of the sedation medicines used during the test).    FOLLOW UP: Our staff will call the number listed on your records the next business day following your procedure to check on you and address any questions or concerns that you may have regarding the information given to you following your procedure. If we do not reach you, we will leave a message.  However, if you are feeling well and you are not experiencing any problems, there is no need to return our call.  We will assume that you have returned to your regular daily activities without incident.  If any biopsies were taken you will be contacted by phone or by letter within the next 1-3 weeks.  Please call us at [phone] if you have not heard about the biopsies in 3 weeks.  SIGNATURES/CONFIDENTIALITY: You and/or your care partner have signed paperwork which will be entered into your electronic medical record.  These signatures attest to the fact that that the information above on your After Visit Summary has been reviewed and is understood.  Full responsibility of the confidentiality of this discharge information lies with you and/or your care-partner.

## 2016-03-23 NOTE — Progress Notes (Signed)
Called to room to assist during endoscopic procedure.  Patient ID and intended procedure confirmed with present staff. Received instructions for my participation in the procedure from the performing physician.  

## 2016-03-23 NOTE — Progress Notes (Signed)
Report to PACU, RN, vss, BBS= Clear.  

## 2016-03-24 ENCOUNTER — Encounter: Payer: Medicare Other | Admitting: Internal Medicine

## 2016-03-24 ENCOUNTER — Telehealth: Payer: Self-pay

## 2016-03-24 NOTE — Telephone Encounter (Signed)

## 2016-04-09 ENCOUNTER — Encounter: Payer: Self-pay | Admitting: Gastroenterology

## 2016-04-22 ENCOUNTER — Ambulatory Visit (INDEPENDENT_AMBULATORY_CARE_PROVIDER_SITE_OTHER): Payer: Medicare Other | Admitting: Internal Medicine

## 2016-04-22 ENCOUNTER — Encounter (INDEPENDENT_AMBULATORY_CARE_PROVIDER_SITE_OTHER): Payer: Self-pay

## 2016-04-22 ENCOUNTER — Encounter: Payer: Self-pay | Admitting: Internal Medicine

## 2016-04-22 VITALS — BP 148/77 | HR 76 | Temp 98.0°F | Ht 60.4 in | Wt 125.8 lb

## 2016-04-22 DIAGNOSIS — F1021 Alcohol dependence, in remission: Secondary | ICD-10-CM

## 2016-04-22 DIAGNOSIS — I10 Essential (primary) hypertension: Secondary | ICD-10-CM | POA: Diagnosis not present

## 2016-04-22 DIAGNOSIS — Z79899 Other long term (current) drug therapy: Secondary | ICD-10-CM

## 2016-04-22 DIAGNOSIS — Z789 Other specified health status: Secondary | ICD-10-CM

## 2016-04-22 DIAGNOSIS — Z7289 Other problems related to lifestyle: Secondary | ICD-10-CM

## 2016-04-22 DIAGNOSIS — K254 Chronic or unspecified gastric ulcer with hemorrhage: Secondary | ICD-10-CM

## 2016-04-22 DIAGNOSIS — F17210 Nicotine dependence, cigarettes, uncomplicated: Secondary | ICD-10-CM

## 2016-04-22 DIAGNOSIS — K259 Gastric ulcer, unspecified as acute or chronic, without hemorrhage or perforation: Secondary | ICD-10-CM

## 2016-04-22 DIAGNOSIS — M79642 Pain in left hand: Secondary | ICD-10-CM | POA: Diagnosis not present

## 2016-04-22 DIAGNOSIS — Z0000 Encounter for general adult medical examination without abnormal findings: Secondary | ICD-10-CM

## 2016-04-22 DIAGNOSIS — M79641 Pain in right hand: Secondary | ICD-10-CM

## 2016-04-22 DIAGNOSIS — F17200 Nicotine dependence, unspecified, uncomplicated: Secondary | ICD-10-CM

## 2016-04-22 MED ORDER — DICLOFENAC SODIUM 1 % TD GEL: 2.0000 g | Freq: Four times a day (QID) | TRANSDERMAL | 2 refills | Status: DC

## 2016-04-22 MED ORDER — VALSARTAN-HYDROCHLOROTHIAZIDE 160-12.5 MG PO TABS: 2.0000 | ORAL_TABLET | Freq: Every day | ORAL | 1 refills | Status: DC

## 2016-04-22 NOTE — Progress Notes (Signed)
   CC: Hand Pain  HPI:  Kylie Torres is a 71 y.o. female with a past medical history listed below here today with complaints of hand pain.   For details of today's visit and the status of her chronic medical issues please refer to the assessment and plan.\  Past Medical History:  Diagnosis Date  . Alcohol abuse   . Blood transfusion without reported diagnosis   . Cirrhosis ([place])   . Gastric ulcer   . Hypertension     Review of Systems:   See HPI  Physical Exam:  Vitals:   04/22/16 1522  BP: (!) 147/79  Pulse: 83  Temp: 98 F (36.7 C)  TempSrc: Oral  SpO2: 100%  Weight: 125 lb 12.8 oz (57.1 kg)  Height: 5' 0.4" (1.534 m)   Physical Exam  Constitutional: She is well-developed, well-nourished, and in no distress. No distress.  Cardiovascular: Normal rate and regular rhythm.   Murmur heard. Pulmonary/Chest: Effort normal and breath sounds normal.  Musculoskeletal: Normal range of motion. She exhibits no edema, tenderness or deformity.  Skin: Skin is dry.    Assessment & Plan:   See Encounters Tab for problem based charting.  Patient discussed with Dr. Evette Doffing

## 2016-04-22 NOTE — Patient Instructions (Signed)
Kylie Torres,  Please continue to work on quitting smoking. Please call 1-800-QUIT-NOW for additional resources. Great work on stopping the drinking. I am sending in a prescription for Voltaren gel to use on your hands. I am going to double your dose of the Valsartan-HCTZ dose so take 2 tablets instead of one now.

## 2016-04-23 DIAGNOSIS — M79641 Pain in right hand: Secondary | ICD-10-CM | POA: Insufficient documentation

## 2016-04-23 DIAGNOSIS — M79642 Pain in left hand: Secondary | ICD-10-CM

## 2016-04-23 DIAGNOSIS — M79644 Pain in right finger(s): Secondary | ICD-10-CM | POA: Insufficient documentation

## 2016-04-23 NOTE — Assessment & Plan Note (Signed)
BP Readings from Last 3 Encounters:  04/22/16 (!) 148/77  03/23/16 (!) 177/99  12/23/15 128/74    Lab Results  Component Value Date   NA 142 12/04/2015   K 4.1 12/04/2015   CREATININE 0.67 12/04/2015   BP mildly elevated at 148/77 today. She is currently on metoprolol 25 mg daily, amlodipine 5 mg daily and valsartan-HCTZ 160-12.5 mg daily. Denies any headaches, blurry vision, lightheadedness/dizziness.   Assessment: Hypertension, improved  Plan: Increase valsartan-HCTZ to 320-25 mg daily

## 2016-04-23 NOTE — Assessment & Plan Note (Signed)
Reports continued abstinence today.

## 2016-04-23 NOTE — Assessment & Plan Note (Signed)
Smoking a half a pack a day. Not taking anything to help her. Plans to quit smoking completely by May as her apartment complex is going to no smoking policy. She has never been on medications. Has not tried the patches or gum. Does not have any definite plan to quit. Declined anything to help with quitting but notes significant cravings today.   Encouraged cessation. Provided information and resources to help quit.

## 2016-04-23 NOTE — Assessment & Plan Note (Signed)
Had EGD done 2/28. Showed normal esophagus with evidence of non-bleeding erosive gastropathy and duodenal erosion without bleeding. Continued on PPI qam. Reports compliance and avoidance of NSAIDs. Denies any abdominal pain, nausea, vomiting, hematemsis, melena or hematochezia.   Assessment: NSAID induced gastric uclers  Plan: Continue protonix 40 mg daily

## 2016-04-23 NOTE — Assessment & Plan Note (Signed)
Agreeable to DEXA scan. Ordering today.

## 2016-04-23 NOTE — Assessment & Plan Note (Signed)
Kylie Torres has complaints of bilateral hand pain today. Reports that she first noticed having issues about six months ago. Denies any previous history of hand pain. Says that the pain is worst in her thumb but is also present in her fingers as well. She has difficulty describing the pain but reports more soreness than anything. The pain is constant. Nothing makes it worse. Does note some relief with Tylenol. Denies any rashes, trauma, weakness or numbness. 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   No abnormalities noted on exam with intact strength and sensation.  Assessment: Hand Pain likely 2/2 OA  Plan: Given her history of GI bleeds, she cannot take NSAIDs. She also has evidence suggestive of alcoholic cirrhosis. Will do trial of Voltaren gel prn.

## 2016-04-27 NOTE — Progress Notes (Signed)
Internal Medicine Clinic Attending  Case discussed with Dr. Boswell at the time of the visit.  We reviewed the resident's history and exam and pertinent patient test results.  I agree with the assessment, diagnosis, and plan of care documented in the resident's note.  

## 2016-04-28 ENCOUNTER — Telehealth: Payer: Self-pay | Admitting: *Deleted

## 2016-04-28 DIAGNOSIS — I10 Essential (primary) hypertension: Secondary | ICD-10-CM

## 2016-04-28 NOTE — Telephone Encounter (Signed)
Received faxed PA request from pt's pharmacy for valsartan-htcz 160-12.5mg  tablets-take 2 tablets daily #60.  Discussed dose with pcp-ok to change rx to valsartan-htcz  320-25mg  tablets take one daily.Marland Kitchenkg

## 2016-04-28 NOTE — Telephone Encounter (Signed)
Received faxed PA Request from patients pharmacy for diclofenac 1% .  Request submitted online via cover my meds-see outcome below: Your request has been approved  Request Reference Number: [id number]. DICLOFENAC GEL 1% is approved through 01/25/2017. For further questions, call [phone].

## 2016-06-03 ENCOUNTER — Encounter: Payer: Self-pay | Admitting: *Deleted

## 2016-07-22 ENCOUNTER — Encounter: Payer: Medicare Other | Admitting: Internal Medicine

## 2016-08-26 ENCOUNTER — Ambulatory Visit (INDEPENDENT_AMBULATORY_CARE_PROVIDER_SITE_OTHER): Payer: Medicare Other | Admitting: Internal Medicine

## 2016-08-26 ENCOUNTER — Encounter: Payer: Self-pay | Admitting: Internal Medicine

## 2016-08-26 DIAGNOSIS — K254 Chronic or unspecified gastric ulcer with hemorrhage: Secondary | ICD-10-CM | POA: Diagnosis not present

## 2016-08-26 DIAGNOSIS — I739 Peripheral vascular disease, unspecified: Secondary | ICD-10-CM

## 2016-08-26 DIAGNOSIS — M79642 Pain in left hand: Secondary | ICD-10-CM | POA: Diagnosis not present

## 2016-08-26 DIAGNOSIS — Z0000 Encounter for general adult medical examination without abnormal findings: Secondary | ICD-10-CM

## 2016-08-26 DIAGNOSIS — M79641 Pain in right hand: Secondary | ICD-10-CM

## 2016-08-26 DIAGNOSIS — F17210 Nicotine dependence, cigarettes, uncomplicated: Secondary | ICD-10-CM | POA: Diagnosis not present

## 2016-08-26 DIAGNOSIS — I10 Essential (primary) hypertension: Secondary | ICD-10-CM | POA: Diagnosis not present

## 2016-08-26 DIAGNOSIS — F17200 Nicotine dependence, unspecified, uncomplicated: Secondary | ICD-10-CM

## 2016-08-26 DIAGNOSIS — Z8249 Family history of ischemic heart disease and other diseases of the circulatory system: Secondary | ICD-10-CM

## 2016-08-26 DIAGNOSIS — K7030 Alcoholic cirrhosis of liver without ascites: Secondary | ICD-10-CM | POA: Diagnosis not present

## 2016-08-26 MED ORDER — ATORVASTATIN CALCIUM 20 MG PO TABS: 20.0000 mg | ORAL_TABLET | Freq: Every day | ORAL | 2 refills | Status: DC

## 2016-08-26 MED ORDER — CILOSTAZOL 100 MG PO TABS: 100.0000 mg | ORAL_TABLET | Freq: Two times a day (BID) | ORAL | 2 refills | Status: DC

## 2016-08-26 MED ORDER — VALSARTAN-HYDROCHLOROTHIAZIDE 160-12.5 MG PO TABS: 2.0000 | ORAL_TABLET | Freq: Every day | ORAL | 2 refills | Status: DC

## 2016-08-26 MED ORDER — PANTOPRAZOLE SODIUM 40 MG PO TBEC: DELAYED_RELEASE_TABLET | ORAL | 6 refills | Status: DC

## 2016-08-26 NOTE — Progress Notes (Signed)
   CC: hand and leg pain  HPI:  [name] is a 71 y.o. female with a past medical history listed below here today for follow up of her HTN with complaints of hand and leg pain.  For details of today's visit and the status of her chronic medical issues please refer to the assessment and plan.   Past Medical History:  Diagnosis Date  . Alcohol abuse   . Blood transfusion without reported diagnosis   . Cirrhosis ([place])   . Gastric ulcer   . Hypertension    Review of Systems:   No chest pain or shortness of breath  Physical Exam:  Vitals:   08/26/16 1541  BP: (!) 174/95  Pulse: 64  Temp: 97.7 F (36.5 C)  TempSrc: Oral  SpO2: 98%  Weight: 131 lb 11.2 oz (59.7 kg)  Height: 5' 0.4" (1.534 m)   General: alert, well-developed, appears older than stated age, and cooperative to examination.  Lungs: normal respiratory effort, normal breath sounds, no crackles, and no wheezes. Heart: normal rate, regular rhythm, 2/6 systolic murmur, no gallop, and no rub.  Abdomen: soft, non-tender, normal bowel sounds, no distention Msk: no joint swelling, no joint warmth, and no redness over joints.     Assessment & Plan:   See Encounters Tab for problem based charting.  Patient discussed with Dr. Dareen Piano

## 2016-08-26 NOTE — Patient Instructions (Addendum)
Kylie Torres,  I am glad you are doing well today. In the future if you run out of your medications, please call the clinic. We can send in prescription refills to your pharmacy for you. Please pick up your medications and start taking them again today.  I would like you to come back to clinic in a month ([facility]) for follow up on your blood pressure and follow up with me in 6 months.

## 2016-08-28 ENCOUNTER — Telehealth: Payer: Self-pay | Admitting: *Deleted

## 2016-08-28 NOTE — Telephone Encounter (Signed)
Information was sent to CoverMyMeds for [place] for Valsartan-HCTZ awaiting review and response within 72 hours.  Sander Nephew, RN 08/28/2016 10:18 AM.

## 2016-09-02 NOTE — Assessment & Plan Note (Signed)
Due for DEXA. Will order today.

## 2016-09-02 NOTE — Progress Notes (Signed)
Internal Medicine Clinic Attending  Case discussed with Dr. Boswell at the time of the visit.  We reviewed the resident's history and exam and pertinent patient test results.  I agree with the assessment, diagnosis, and plan of care documented in the resident's note.  

## 2016-09-02 NOTE — Assessment & Plan Note (Signed)
Lab work from January showing improved AST and ALT as well as stable Hgb and improved platelets to 183. Reports continued abstinence from ETOH.   Plan: Will re-check labs at next visit

## 2016-09-02 NOTE — Assessment & Plan Note (Signed)
Continues to smoke. Reports smoking 5 cigarettes a day. Reports she is trying to quit. Says she tried nicotine gum but it made her feel nauseated. Discussed the proper way to use the gum which she was not doing. She is willing to try the gum and patch in an effort to quit.  Plan: Nicotine patch and gum

## 2016-09-02 NOTE — Assessment & Plan Note (Signed)
Reports improvement with voltaren gel. Also noting some similar leg pain. This is also helped with the voltaren gel.   Continue voltaren gel prn. Avoid oral NSAIDs given history.

## 2016-09-02 NOTE — Assessment & Plan Note (Addendum)
BP Readings from Last 3 Encounters:  08/26/16 (!) 197/87  04/22/16 (!) 148/77  03/23/16 (!) 177/99    Lab Results  Component Value Date   NA 142 12/04/2015   K 4.1 12/04/2015   CREATININE 0.67 12/04/2015   BP uncontrolled today. She reports being out of her blood pressure medications for the past week. She is currently prescribed Valsartan-HCTZ 320-25 mg daily, amlodipine 5 mg daily, and metoprolol tartrate 25 mg bid. Has been reasonably well controlled on this in the past. She denies any chest pain, shortness of breath, palpations, dizziness/lightheadedness, vision changes, or headaches today.   Assessment: Uncontrolled htn  Plan: Resume home meds. Stressed the importance of taking her medications and calling the clinic if she runs out of her medications. Follow up in 1 month for re-check and BMET.

## 2016-09-14 DIAGNOSIS — H401112 Primary open-angle glaucoma, right eye, moderate stage: Secondary | ICD-10-CM | POA: Diagnosis not present

## 2016-10-07 DIAGNOSIS — I739 Peripheral vascular disease, unspecified: Secondary | ICD-10-CM | POA: Diagnosis not present

## 2016-10-07 DIAGNOSIS — Z720 Tobacco use: Secondary | ICD-10-CM | POA: Diagnosis not present

## 2016-10-07 DIAGNOSIS — I10 Essential (primary) hypertension: Secondary | ICD-10-CM | POA: Diagnosis not present

## 2016-10-07 DIAGNOSIS — I422 Other hypertrophic cardiomyopathy: Secondary | ICD-10-CM | POA: Diagnosis not present

## 2016-10-23 ENCOUNTER — Other Ambulatory Visit: Payer: Self-pay

## 2016-10-23 NOTE — Patient Outreach (Signed)
[facility]) Care Management  10/23/2016  MARILENE VATH [birth year] [id number]   Medication Adherence call to Mrs. Elzora Cullins the reason for this call is because Mrs. Schoenberger is showing past due under [place] on her valsartan/hztc 320/12.5 mg spoke to patient she said she has been taking one tablet at day the way the doctor wrote the prescription and still has medication she said she does not need any at this time.    [facility] Direct Dial [phone]  Fax [phone] [email or web address]

## 2016-10-26 DIAGNOSIS — H401132 Primary open-angle glaucoma, bilateral, moderate stage: Secondary | ICD-10-CM | POA: Diagnosis not present

## 2016-10-28 ENCOUNTER — Ambulatory Visit (INDEPENDENT_AMBULATORY_CARE_PROVIDER_SITE_OTHER): Payer: Medicare Other | Admitting: Internal Medicine

## 2016-10-28 ENCOUNTER — Ambulatory Visit: Payer: Medicare Other | Admitting: Pharmacist

## 2016-10-28 ENCOUNTER — Encounter (INDEPENDENT_AMBULATORY_CARE_PROVIDER_SITE_OTHER): Payer: Self-pay

## 2016-10-28 ENCOUNTER — Encounter: Payer: Self-pay | Admitting: Internal Medicine

## 2016-10-28 VITALS — BP 142/68 | HR 62 | Temp 97.6°F | Ht 60.4 in | Wt 130.4 lb

## 2016-10-28 DIAGNOSIS — I10 Essential (primary) hypertension: Secondary | ICD-10-CM | POA: Diagnosis not present

## 2016-10-28 DIAGNOSIS — Z0000 Encounter for general adult medical examination without abnormal findings: Secondary | ICD-10-CM | POA: Diagnosis not present

## 2016-10-28 DIAGNOSIS — Z23 Encounter for immunization: Secondary | ICD-10-CM

## 2016-10-28 DIAGNOSIS — F17200 Nicotine dependence, unspecified, uncomplicated: Secondary | ICD-10-CM | POA: Diagnosis not present

## 2016-10-28 DIAGNOSIS — I739 Peripheral vascular disease, unspecified: Secondary | ICD-10-CM | POA: Diagnosis not present

## 2016-10-28 DIAGNOSIS — K7030 Alcoholic cirrhosis of liver without ascites: Secondary | ICD-10-CM | POA: Diagnosis not present

## 2016-10-28 DIAGNOSIS — E2839 Other primary ovarian failure: Secondary | ICD-10-CM | POA: Diagnosis not present

## 2016-10-28 DIAGNOSIS — K254 Chronic or unspecified gastric ulcer with hemorrhage: Secondary | ICD-10-CM

## 2016-10-28 DIAGNOSIS — M79642 Pain in left hand: Secondary | ICD-10-CM

## 2016-10-28 DIAGNOSIS — M79641 Pain in right hand: Secondary | ICD-10-CM

## 2016-10-28 MED ORDER — METOPROLOL TARTRATE 50 MG PO TABS: 50.0000 mg | ORAL_TABLET | Freq: Two times a day (BID) | ORAL | 11 refills | Status: DC

## 2016-10-28 MED ORDER — NICOTINE 7 MG/24HR TD PT24: 7.0000 mg | MEDICATED_PATCH | Freq: Every day | TRANSDERMAL | 0 refills | Status: DC

## 2016-10-28 MED ORDER — NICOTINE POLACRILEX 2 MG MT GUM: 2.0000 mg | CHEWING_GUM | OROMUCOSAL | 3 refills | Status: DC | PRN

## 2016-10-28 MED ORDER — NICOTINE 14 MG/24HR TD PT24: 14.0000 mg | MEDICATED_PATCH | Freq: Every day | TRANSDERMAL | 1 refills | Status: DC

## 2016-10-28 MED ORDER — AMLODIPINE BESYLATE 10 MG PO TABS: 10.0000 mg | ORAL_TABLET | Freq: Every day | ORAL | 11 refills | Status: DC

## 2016-10-28 NOTE — Progress Notes (Signed)
   CC: HTN follow up  HPI:  [name] is a 71 y.o. female with a past medical history listed below here today for follow up of her HTN.  For details of today's visit and the status of her chronic medical issues please refer to the assessment and plan.  Past Medical History:  Diagnosis Date  . Alcohol abuse   . Blood transfusion without reported diagnosis   . Cirrhosis ([place])   . Gastric ulcer   . Hypertension    Review of Systems:   No chest pain or shortness of breath  Physical Exam:  Vitals:   10/28/16 1522 10/28/16 1556  BP: (!) 160/68 (!) 142/68  Pulse: 61 62  Temp: 97.6 F (36.4 C)   TempSrc: Oral   SpO2: 100%   Weight: 130 lb 6.4 oz (59.1 kg)   Height: 5' 0.4" (1.534 m)    GENERAL- alert, co-operative, appears as stated age, not in any distress. CARDIAC- RRR, 3/6 systolic murmur, no rubs or gallops. RESP- Moving equal volumes of air, and clear to auscultation bilaterally, no wheezes or crackles. ABDOMEN- Soft, nontender, bowel sounds present. EXTREMITIES- no pedal edema. SKIN- Warm, dry, No rash or lesion. PSYCH- Normal mood and affect  Assessment & Plan:   See Encounters Tab for problem based charting.  Patient discussed with Dr. Eppie Gibson

## 2016-10-28 NOTE — Assessment & Plan Note (Addendum)
On Cilostazol 100 mg bid. Followed by Dr. Einar Gip outpatient.

## 2016-10-28 NOTE — Progress Notes (Signed)
S: Kylie Torres is a 71 y.o. female who was referred for smoking cessation.  No Known Allergies Medication Sig  amLODipine (NORVASC) 10 MG tablet Take 1 tablet (10 mg total) by mouth daily.  atorvastatin (LIPITOR) 20 MG tablet Take 1 tablet (20 mg total) by mouth daily.  cilostazol (PLETAL) 100 MG tablet Take 1 tablet (100 mg total) by mouth 2 (two) times daily.  diclofenac sodium (VOLTAREN) 1 % GEL Apply 2 g topically 4 (four) times daily.  metoprolol tartrate (LOPRESSOR) 50 MG tablet Take 1 tablet (50 mg total) by mouth 2 (two) times daily.  nicotine (NICODERM CQ - DOSED IN MG/24 HOURS) 14 mg/24hr patch Place 1 patch (14 mg total) onto the skin daily. Use nicotine gum for cravings  nicotine (NICODERM CQ - DOSED IN MG/24 HR) 7 mg/24hr patch Place 1 patch (7 mg total) onto the skin daily. Start after you finish the 14 mg nicotine patches. Use nicotine gum for cravings  nicotine polacrilex (NICORETTE) 2 MG gum Take 1 each (2 mg total) by mouth as needed for smoking cessation.  pantoprazole (PROTONIX) 40 MG tablet Take 1 tab every daily in the morning.  valsartan-hydrochlorothiazide (DIOVAN-HCT) 160-12.5 MG tablet Take 2 tablets by mouth daily.   Past Medical History:  Diagnosis Date  . Alcohol abuse   . Blood transfusion without reported diagnosis   . Cirrhosis ([place])   . Gastric ulcer   . Hypertension    Social History   Social History  . Marital status: Divorced    Spouse name: N/A  . Number of children: 3  . Years of education: N/A   Occupational History  . retired    Social History Main Topics  . Smoking status: Current Every Day Smoker    Packs/day: 0.50    Years: 50.00    Types: Cigarettes  . Smokeless tobacco: Never Used     Comment: 1/2 pack per day  . Alcohol use No     Comment: quit drinking in 03/2015 - previously 1/5 gin every 2 days  . Drug use: No  . Sexual activity: Not on file   Other Topics Concern  . Not on file   Social History Narrative  . No  narrative on file   Family History  Problem Relation Age of Onset  . Breast cancer Mother   . Heart attack Father   . Hypertension Father   . Hypertension Sister   . Diabetes Sister   . Breast cancer Sister   . Breast cancer Maternal Aunt    O: Component Value Date/Time   AST 18 12/04/2015 1600   ALT 12 12/04/2015 1600   NA 142 12/04/2015 1600   K 4.1 12/04/2015 1600   CL 104 12/04/2015 1600   CO2 20 12/04/2015 1600   GLUCOSE 90 12/04/2015 1600   GLUCOSE 167 (H) 04/18/2015 1435   BUN 14 12/04/2015 1600   CREATININE 0.67 12/04/2015 1600   CALCIUM 9.8 12/04/2015 1600   GFRNONAA 91 12/04/2015 1600   GFRAA 104 12/04/2015 1600   WBC 8.6 12/04/2015 1600   WBC 9.1 04/18/2015 0516   HGB 13.0 12/04/2015 1600   HCT 38.4 12/04/2015 1600   PLT 193 12/04/2015 1600   Ht Readings from Last 2 Encounters:  10/28/16 5' 0.4" (1.534 m)  08/26/16 5' 0.4" (1.534 m)   Wt Readings from Last 2 Encounters:  10/28/16 130 lb 6.4 oz (59.1 kg)  08/26/16 131 lb 11.2 oz (59.7 kg)   There is no height  or weight on file to calculate BMI. BP Readings from Last 3 Encounters:  10/28/16 (!) 142/68  08/26/16 (!) 197/87  04/22/16 (!) 148/77    A/P:  Patient smokes 1/2 pack per day. Reviewed various options with patient and PCP and agreed to initiate nicotine patches, 14 mg daily x 6 weeks then reduce to 7 mg daily.   Patient is interested in adding gum to NRT patches.  Patient was also referred to quitline and [facility] free smoking cessation classes.  Provided patient my information and advised him to contact me if any questions.  Plan for follow up in 1-2 weeks. Patient verbalized understanding of information discussed by repeat back.

## 2016-10-28 NOTE — Assessment & Plan Note (Addendum)
Will re-check LFTs, platelets today.

## 2016-10-28 NOTE — Assessment & Plan Note (Signed)
Post-menopausal. Will order DEXA scan.

## 2016-10-28 NOTE — Assessment & Plan Note (Signed)
Improved with voltaren gel. Likely arthritis. Continue to avoid systemic NSAIDs given history.

## 2016-10-28 NOTE — Patient Instructions (Signed)
Ms. Rance,  Your blood pressure is doing much better. I would like to check some blood work today to make sure everything is doing well on the higher doses of the medications. Continue to work on quitting smoking. Good work avoiding any alcohol.  I would like to see you back in 3 months for follow up.

## 2016-10-28 NOTE — Assessment & Plan Note (Signed)
Doing well without any signs/symptoms of bleeding. Avoiding NSAIDS.  Continue Protonix 40 mg daily.

## 2016-10-28 NOTE — Assessment & Plan Note (Addendum)
Never had DEXA scan done. Reordered today.  Flu shot given today.  Pneuovax 23 given today.

## 2016-10-28 NOTE — Assessment & Plan Note (Addendum)
Smoking 1/2 PPD, variable. Quitting on her own. Reports having the nicotine gum but not trying. Insurance won't cover the patches. Discussed with Dr. Maudie Mercury. Working on getting her patches covered.  A/P Continue to encourage cessation.

## 2016-10-28 NOTE — Assessment & Plan Note (Addendum)
BP Readings from Last 3 Encounters:  10/28/16 (!) 142/68  08/26/16 (!) 197/87  04/22/16 (!) 148/77    Lab Results  Component Value Date   NA 142 12/04/2015   K 4.1 12/04/2015   CREATININE 0.67 12/04/2015   BP elevated at 160/68 on arrival. Improved to 142/68 on re-check. Pulse 61.  Patient currently prescribed valsartan-hctz 320-25 mg daily, metoprolol tartrate 50 mg bid (inc from 25 mg bid at 9/12 cards visit), and amlodipine 10 mg daily (inc from 5 mg at 9/12 cards visit). At her last visit 08/26/16 she had run out of her blood pressure medications for at least 1 week prior to visit and BP was very uncontrolled. Restarted her antihypertensives and told to follow up in 1 month, following up today.   Today, she reports she is doing well without any side effects from the medications. Denies any headaches, vision changes, dizziness/lightheadedness, palpations, chest pain, or shortness of breath.   A/P: BP much improved from previous. On max doses of 4 anti-hypertensives. K was 4.8 on January labs. Will re-check BMET today. Consider addition of spironolactone vs terazosin (depending on K) in the future if BP remains elevated.

## 2016-10-29 LAB — CMP14 + ANION GAP
A/G RATIO: 1.7 (ref 1.2–2.2)
ALT: 12 IU/L (ref 0–32)
ANION GAP: 16 mmol/L (ref 10.0–18.0)
AST: 17 IU/L (ref 0–40)
Albumin: 4.5 g/dL (ref 3.6–4.8)
Alkaline Phosphatase: 156 IU/L — ABNORMAL HIGH (ref 39–117)
BUN/Creatinine Ratio: 19 (ref 12–28)
BUN: 14 mg/dL (ref 8–27)
Bilirubin Total: 0.9 mg/dL (ref 0.0–1.2)
CALCIUM: 9.4 mg/dL (ref 8.7–10.3)
CO2: 23 mmol/L (ref 20–29)
CREATININE: 0.73 mg/dL (ref 0.57–1.00)
Chloride: 103 mmol/L (ref 96–106)
GFR, EST AFRICAN AMERICAN: 97 mL/min/{1.73_m2} (ref >59)
GFR, EST NON AFRICAN AMERICAN: 84 mL/min/{1.73_m2} (ref >59)
GLUCOSE: 101 mg/dL — AB (ref 65–99)
Globulin, Total: 2.6 g/dL (ref 1.5–4.5)
POTASSIUM: 4.1 mmol/L (ref 3.5–5.2)
Sodium: 142 mmol/L (ref 134–144)
TOTAL PROTEIN: 7.1 g/dL (ref 6.0–8.5)

## 2016-10-29 LAB — CBC
HEMATOCRIT: 40.9 % (ref 34.0–46.6)
HEMOGLOBIN: 14 g/dL (ref 11.1–15.9)
MCH: 30.4 pg (ref 26.6–33.0)
MCHC: 34.2 g/dL (ref 31.5–35.7)
MCV: 89 fL (ref 79–97)
Platelets: 206 10*3/uL (ref 150–379)
RBC: 4.61 x10E6/uL (ref 3.77–5.28)
RDW: 14.7 % (ref 12.3–15.4)
WBC: 9.2 10*3/uL (ref 3.4–10.8)

## 2016-10-29 MED FILL — NICOTINE 14 MG/24HR PATCH: 14 | 28 days supply | Qty: 28 | Fill #0

## 2016-10-29 MED FILL — SM NICOTINE 2 MG CHEWING GU: 2 | 30 days supply | Qty: 110 | Fill #0

## 2016-11-03 NOTE — Progress Notes (Signed)
Case discussed with Dr. Charlynn Grimes soon after the resident saw the patient.  We reviewed the resident's history and exam and pertinent patient test results.  I agree with the assessment, diagnosis and plan of care documented in the resident's note.

## 2016-12-02 ENCOUNTER — Other Ambulatory Visit: Payer: Self-pay | Admitting: Internal Medicine

## 2016-12-02 DIAGNOSIS — Z1231 Encounter for screening mammogram for malignant neoplasm of breast: Secondary | ICD-10-CM

## 2017-01-13 DIAGNOSIS — Z720 Tobacco use: Secondary | ICD-10-CM | POA: Diagnosis not present

## 2017-01-13 DIAGNOSIS — I43 Cardiomyopathy in diseases classified elsewhere: Secondary | ICD-10-CM | POA: Diagnosis not present

## 2017-01-13 DIAGNOSIS — I119 Hypertensive heart disease without heart failure: Secondary | ICD-10-CM | POA: Diagnosis not present

## 2017-01-13 DIAGNOSIS — I10 Essential (primary) hypertension: Secondary | ICD-10-CM | POA: Diagnosis not present

## 2017-01-27 DIAGNOSIS — I10 Essential (primary) hypertension: Secondary | ICD-10-CM | POA: Diagnosis not present

## 2017-01-27 DIAGNOSIS — H401132 Primary open-angle glaucoma, bilateral, moderate stage: Secondary | ICD-10-CM | POA: Diagnosis not present

## 2017-01-28 ENCOUNTER — Ambulatory Visit: Payer: Medicare Other

## 2017-02-02 ENCOUNTER — Ambulatory Visit: Payer: Medicare Other

## 2017-02-18 ENCOUNTER — Ambulatory Visit
Admission: RE | Admit: 2017-02-18 | Discharge: 2017-02-18 | Disposition: A | Discharge status: Home / Self Care | Payer: Medicare Other | Source: Ambulatory Visit | Attending: Internal Medicine | Admitting: Internal Medicine

## 2017-02-18 DIAGNOSIS — Z1231 Encounter for screening mammogram for malignant neoplasm of breast: Secondary | ICD-10-CM

## 2017-02-24 DIAGNOSIS — I43 Cardiomyopathy in diseases classified elsewhere: Secondary | ICD-10-CM | POA: Diagnosis not present

## 2017-02-24 DIAGNOSIS — I10 Essential (primary) hypertension: Secondary | ICD-10-CM | POA: Diagnosis not present

## 2017-02-24 DIAGNOSIS — I119 Hypertensive heart disease without heart failure: Secondary | ICD-10-CM | POA: Diagnosis not present

## 2017-02-24 DIAGNOSIS — Z720 Tobacco use: Secondary | ICD-10-CM | POA: Diagnosis not present

## 2017-03-03 ENCOUNTER — Ambulatory Visit (INDEPENDENT_AMBULATORY_CARE_PROVIDER_SITE_OTHER): Payer: Medicare Other | Admitting: Internal Medicine

## 2017-03-03 ENCOUNTER — Encounter: Payer: Self-pay | Admitting: Internal Medicine

## 2017-03-03 ENCOUNTER — Other Ambulatory Visit: Payer: Self-pay

## 2017-03-03 DIAGNOSIS — Z7289 Other problems related to lifestyle: Secondary | ICD-10-CM

## 2017-03-03 DIAGNOSIS — R748 Abnormal levels of other serum enzymes: Secondary | ICD-10-CM

## 2017-03-03 DIAGNOSIS — F17200 Nicotine dependence, unspecified, uncomplicated: Secondary | ICD-10-CM

## 2017-03-03 DIAGNOSIS — Z789 Other specified health status: Secondary | ICD-10-CM

## 2017-03-03 DIAGNOSIS — M545 Low back pain, unspecified: Secondary | ICD-10-CM

## 2017-03-03 DIAGNOSIS — I10 Essential (primary) hypertension: Secondary | ICD-10-CM | POA: Diagnosis not present

## 2017-03-03 DIAGNOSIS — E2839 Other primary ovarian failure: Secondary | ICD-10-CM

## 2017-03-03 NOTE — Patient Instructions (Addendum)
Ms. Croy,  Dennis Bast can try Tylenol, heat, and the Voltaren gel for your back pain. If it is not getting any better let us know.   We will get you set up for the DEXA scan.  Please follow up with me in 3 months.

## 2017-03-03 NOTE — Progress Notes (Signed)
   CC: HTN follow up  HPI:  [name] is a 72 y.o. female with a past medical history listed below here today for follow up of her HTN  For details of today's visit and the status of her chronic medical issues please refer to the assessment and plan.   Past Medical History:  Diagnosis Date  . Alcohol abuse   . Blood transfusion without reported diagnosis   . Cirrhosis ([place])   . Gastric ulcer   . Hypertension    Review of Systems:   No chest pain or shortness of breath  Physical Exam:  Vitals:   03/03/17 1523  BP: (!) 164/83  Pulse: 60  Temp: 98.8 F (37.1 C)  TempSrc: Oral  SpO2: 100%  Weight: 132 lb 14.4 oz (60.3 kg)  Height: 5' 0.4" (1.534 m)   GENERAL- alert, co-operative, appears as stated age, not in any distress. HEENT- Atraumatic, normocephalic, PERRL, EOMI, oral mucosa appears moist CARDIAC- RRR, no murmurs, rubs or gallops. RESP- Moving equal volumes of air, and clear to auscultation bilaterally, no wheezes or crackles. ABDOMEN- Soft, bowel sounds present. Tenderness to palpation over suprapubic region.  NEURO- No obvious Cr N abnormality. EXTREMITIES- pulse 2+, symmetric, no pedal edema. SKIN- Warm, dry, No rash or lesion. PSYCH- Normal mood and affect, appropriate thought content and speech. MSK - Tenderness to palpation over bilateral lower back without tenderness over spine.   Assessment & Plan:   See Encounters Tab for problem based charting.  Patient discussed with Dr. Lynnae January

## 2017-03-05 DIAGNOSIS — R748 Abnormal levels of other serum enzymes: Secondary | ICD-10-CM | POA: Insufficient documentation

## 2017-03-05 DIAGNOSIS — M545 Low back pain, unspecified: Secondary | ICD-10-CM | POA: Insufficient documentation

## 2017-03-05 NOTE — Assessment & Plan Note (Signed)
Patient never had her DEXA scan done. Will try to arrange for this to be done.

## 2017-03-05 NOTE — Assessment & Plan Note (Signed)
BP Readings from Last 3 Encounters:  03/03/17 (!) 164/83  10/28/16 (!) 142/68  08/26/16 (!) 197/87    Lab Results  Component Value Date   NA 142 10/28/2016   K 4.1 10/28/2016   CREATININE 0.73 10/28/2016   BP elevated today at 164.83. HR 60. Currently prescribed valsartan-hctz 320-25 mg daily, metoprolol 50 mg bid, and amlodipine 10 mg daily. She reports compliance with these medications. Reviewing her refill history I can only seen metoprolol being filled. Denies any headaches, chest pain, SOB, palpitations.   A/P: Will look into her refill history as she has reported running out of her medications at previous visits. If she is taking her medications as prescribed will add spironolactone to her regimen as her K has been stable.

## 2017-03-05 NOTE — Assessment & Plan Note (Signed)
Patient with persistent elevated alk phos on labs. Discussed checking vitamin D level today but patient wishes to defer lab draw today.

## 2017-03-05 NOTE — Assessment & Plan Note (Signed)
Reports smoking a pack every 3 days. She had tried the gum but does not like it and stopped using it. Discussed quitting strategies today but patient is reluctant and reports she will 'quit on my own.'  Continue to encourage cessation.

## 2017-03-05 NOTE — Assessment & Plan Note (Signed)
Patient reports 2 day history of bilateral lower back pain. She does not recall any inciting injuries or falls. No heavy lifting. Reports she was at rest when she first noticed the pain and it has been persistent since it started 2 days ago. She notes bilateral lower back pain that radiates to her groin. No alarm symptoms. No urinary symptoms. No hematuria noted. No CVA tenderness. Some mild tenderness to palpation over paraspinal muscles and suprapubic and inguinal regions but otherwise normal examination. She has not tried anything for the pain.  Suspect this is MSK in nature. Recommend Tylenol, heat/ice, stretching. If no improvement in 2 weeks can consider imaging studies.

## 2017-03-05 NOTE — Assessment & Plan Note (Signed)
Reports continued abstinence  

## 2017-03-05 NOTE — Progress Notes (Signed)
Internal Medicine Clinic Attending  Case discussed with Dr. Boswell at the time of the visit.  We reviewed the resident's history and exam and pertinent patient test results.  I agree with the assessment, diagnosis, and plan of care documented in the resident's note.  

## 2017-05-19 DIAGNOSIS — I313 Pericardial effusion (noninflammatory): Secondary | ICD-10-CM | POA: Diagnosis not present

## 2017-05-26 DIAGNOSIS — I43 Cardiomyopathy in diseases classified elsewhere: Secondary | ICD-10-CM | POA: Diagnosis not present

## 2017-05-26 DIAGNOSIS — I10 Essential (primary) hypertension: Secondary | ICD-10-CM | POA: Diagnosis not present

## 2017-05-26 DIAGNOSIS — Z720 Tobacco use: Secondary | ICD-10-CM | POA: Diagnosis not present

## 2017-05-26 DIAGNOSIS — I119 Hypertensive heart disease without heart failure: Secondary | ICD-10-CM | POA: Diagnosis not present

## 2017-06-02 ENCOUNTER — Encounter: Payer: Medicare Other | Admitting: Internal Medicine

## 2017-06-04 DIAGNOSIS — H3563 Retinal hemorrhage, bilateral: Secondary | ICD-10-CM | POA: Diagnosis not present

## 2017-06-08 ENCOUNTER — Encounter (INDEPENDENT_AMBULATORY_CARE_PROVIDER_SITE_OTHER): Payer: Medicare Other | Admitting: Ophthalmology

## 2017-06-14 NOTE — Progress Notes (Signed)
[facility] Note  06/15/2017     CHIEF COMPLAINT Patient presents for Retina Evaluation   HISTORY OF PRESENT ILLNESS: Kylie Torres is a 72 y.o. female who presents to the clinic today for:   HPI    Retina Evaluation    In both eyes.  This started 2 years ago.  Associated Symptoms Negative for Blind Spot, Glare, Shoulder/Hip pain, Fatigue, Jaw Claudication, Photophobia, Distortion, Floaters, Redness, Scalp Tenderness, Weight Loss, Fever, Trauma, Pain and Flashes.  Context:  distance vision, mid-range vision and near vision.  Treatments tried include surgery and eye drops.  Response to treatment was mild improvement.  I, the attending physician,  performed the HPI with the patient and updated documentation appropriately.          Comments    Referral of Dr. Wynetta Emery for retina evaluation. Patient states" her vision is good with readers, but not so good without them", she had cataract sx ou via Dr. Katy Fitch appx 2 yrs ago with 'slight' improvement voiced. Patient denies wavy vision and ocular pain. Pt is using Latanoprost gtt's OS @HS , Alphagan gtt's Os Bid, she is taking multivitamins qd.       Last edited by Bernarda Caffey, MD on 06/15/2017  9:25 AM. (History)    Pt states Dr. Wynetta Emery saw "some swelling in the back of my left eye; Pt reports she is alos seeing a cardiologist; Pt reports she had cataract sx with Dr. Zenia Resides; Pt states she is to use latanoprost OU qhs, alphagan OD BID but states she has not used them "in awhile", but reports she "started back using them"; Pt endorses dx of HTN, denies any other medical dx; Pt denies any autoimmune diseases, denies any family history of autoimmune disease; Pt states she had a hear murmur years ago, states cardiologist had an ultrasound done of legs due to leg swelling;   Referring physician: Gwendalyn Ege, [place], [place] [address]  HISTORICAL INFORMATION:   Selected notes from the  MEDICAL RECORD NUMBER Referred by Dr. Gwendalyn Ege for concern of retinal hemorrhage OU LEE: 05.10.19 Mellody Memos) [BCVA: OD: 20/30-1 OS: 20/25] Ocular Hx-Glaucoma OU, Pseudophakia OU PMH-HTN, current smoker    CURRENT MEDICATIONS: Current Outpatient Medications (Ophthalmic Drugs)  Medication Sig  . brimonidine (ALPHAGAN) 0.2 % ophthalmic solution INT 1 GTT IN OD BID   No current facility-administered medications for this visit.  (Ophthalmic Drugs)   Current Outpatient Medications (Other)  Medication Sig  . amLODipine (NORVASC) 10 MG tablet Take 1 tablet (10 mg total) by mouth daily.  Marland Kitchen atorvastatin (LIPITOR) 20 MG tablet Take 1 tablet (20 mg total) by mouth daily.  . cilostazol (PLETAL) 100 MG tablet Take 1 tablet (100 mg total) by mouth 2 (two) times daily.  . diclofenac sodium (VOLTAREN) 1 % GEL Apply 2 g topically 4 (four) times daily.  . metoprolol tartrate (LOPRESSOR) 50 MG tablet Take 1 tablet (50 mg total) by mouth 2 (two) times daily.  . pantoprazole (PROTONIX) 40 MG tablet Take 1 tab every daily in the morning.  . valsartan-hydrochlorothiazide (DIOVAN-HCT) 160-12.5 MG tablet Take 2 tablets by mouth daily.   No current facility-administered medications for this visit.  (Other)      REVIEW OF SYSTEMS: ROS    Positive for: Eyes   Negative for: Constitutional, Gastrointestinal, Neurological, Skin, Genitourinary, Musculoskeletal, HENT, Endocrine, Cardiovascular, Respiratory, Psychiatric, Allergic/Imm, Heme/Lymph   Last edited by Zenovia Jordan, LPN on 09/26/5174  8:44 AM. (History)       ALLERGIES No Known Allergies  PAST MEDICAL HISTORY Past Medical History:  Diagnosis Date  . Alcohol abuse   . Blood transfusion without reported diagnosis   . Cirrhosis ([place])   . Gastric ulcer   . Hypertension    Past Surgical History:  Procedure Laterality Date  . ABDOMINAL HYSTERECTOMY    . BREAST LUMPECTOMY    . CATARACT EXTRACTION    . ESOPHAGOGASTRODUODENOSCOPY (EGD)  WITH PROPOFOL N/A 04/17/2015   Procedure: ESOPHAGOGASTRODUODENOSCOPY (EGD) WITH PROPOFOL;  Surgeon: Ladene Artist, MD;  Location: [facility];  Service: Endoscopy;  Laterality: N/A;  . EYE SURGERY    . OOPHORECTOMY      FAMILY HISTORY Family History  Problem Relation Age of Onset  . Breast cancer Mother 6  . Heart attack Father   . Hypertension Father   . Hypertension Sister   . Diabetes Sister   . Breast cancer Sister 21  . Breast cancer Maternal Aunt        ? age of onset    SOCIAL HISTORY Social History   Tobacco Use  . Smoking status: Current Some Day Smoker    Packs/day: 0.30    Years: 0.03    Pack years: 0.00    Types: Cigarettes  . Smokeless tobacco: Never Used  . Tobacco comment: 1  pack every 3 days   Substance Use Topics  . Alcohol use: No    Comment: quit drinking in 03/2015 - previously 1/5 gin every 2 days  . Drug use: No         OPHTHALMIC EXAM:  Base Eye Exam    Visual Acuity (Snellen - Linear)      Right Left   Dist [place] 20/25 -1 20/20   Dist ph [place]        Tonometry (Tonopen, 8:58 AM)      Right Left   Pressure 12 10       Pupils      Dark Light Shape React APD   Right 4 2 Round Brisk None   Left 4 2 Round Brisk None       Visual Fields (Counting fingers)      Left Right    Full Full       Extraocular Movement      Right Left    Full, Ortho Full, Ortho       Neuro/Psych    Oriented x3:  Yes   Mood/Affect:  Normal       Dilation    Both eyes:  1.0% Mydriacyl, 2.5% Phenylephrine @ 8:58 AM        Slit Lamp and Fundus Exam    Slit Lamp Exam      Right Left   Lids/Lashes Dermatochalasis - upper lid Dermatochalasis - upper lid   Conjunctiva/Sclera Nasal and Temporal Pinguecula, Melanosis Nasal and Temporal Pinguecula, Melanosis   Cornea Arcus, Well healed cataract wounds Arcus, Well healed cataract wounds   Anterior Chamber Deep and quiet Deep and quiet   Iris Round and dilated Round and dilated   Lens PC IOL in good  position PC IOL in good position   Vitreous Vitreous syneresis Vitreous syneresis       Fundus Exam      Right Left   Disc Pink and Sharp Pink and Sharp   C/D Ratio 0.5 0.3   Macula Good foveal reflex, mild Retinal pigment epithelial mottling, No heme or edema Good foveal reflex,  Retinal pigment epithelial mottling, mild pre-retinal hemorrhage superior to fovea, patch of pre-retinal hemorrhage along superior arcade   Vessels Mild Copper wiring, mild sheathing of surperior vessels running to 1200 Vascular attenuation, mild Copper wiring   Periphery Attached Attached, scattered blot hemorrhages        Refraction    Manifest Refraction      Sphere Cylinder Dist VA   Right      Left +0.50 Sphere 20/20          IMAGING AND PROCEDURES  Imaging and Procedures for @TODAY @  OCT, Retina - OU - Both Eyes       Right Eye Quality was good. Central Foveal Thickness: 262. Progression has no prior data. Findings include normal foveal contour, no IRF, no SRF, vitreomacular adhesion .   Left Eye Quality was good. Central Foveal Thickness: 261. Progression has no prior data. Findings include normal foveal contour, no IRF, no SRF, vitreomacular adhesion .   Notes *Images captured and stored on drive  Diagnosis / Impression:  NFP, No IRF/SRF, VMA OU  Clinical management:  See below  Abbreviations: NFP - Normal foveal profile. CME - cystoid macular edema. PED - pigment epithelial detachment. IRF - intraretinal fluid. SRF - subretinal fluid. EZ - ellipsoid zone. ERM - epiretinal membrane. ORA - outer retinal atrophy. ORT - outer retinal tubulation. SRHM - subretinal hyper-reflective material         Fluorescein Angiography Optos (Transit OS)       Right Eye Progression has no prior data. Early phase findings include vascular perfusion defect, microaneurysm, retinal neovascularization. Mid/Late phase findings include vascular perfusion defect, microaneurysm, retinal  neovascularization, leakage.   Left Eye Progression has no prior data. Early phase findings include delayed filling, blockage. Mid/Late phase findings include blockage, leakage.   Notes Images stored on drive;   Impression: OD: focal area of peripheral nonperfusion superior with microaneurysms and neovascular, hyperfluorescent leakage OS: delayed filling with venous filling not occurring until 35-40 seconds; blockage corresponding to areas of hemorrhage; focal areas of late hyperfluorescent leakage inf temp periphery                  ASSESSMENT/PLAN:    ICD-10-CM   1. Retinal hemorrhage of left eye H35.62   2. 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 Retinal edema H35.81 OCT, Retina - OU - Both Eyes  4. Essential hypertension I10   5. Hypertensive retinopathy of both eyes H35.033 Fluorescein Angiography Optos (Transit OS)  6. Pseudophakia of both eyes Z96.1     1. Preretinal hemorrhages OS - scattered peripheral blot hemes sparing macula - unclear etiology - FA today 06/15/17 shows severely delayed filling times OS raising concern for ocular ischemia and possible compromise of carotid arteries - pt with significant cardiovascular history - discussed findings, prognosis and treatment options with pt and family - recommend carotid dopplers and further cardiovascular evaluation and optimization - will send letter to Dr. Filbert Schilder (Cardiologist) - F/u 1-2 weeks  2. Focal area of sclerotic / sheathed vessels superiorly OD with nonperfusion - unclear etiology - likely related to cardiovascular history, but differential includes inflammatory, autoimmune, infectious,  - recommend focal PRP to areas of nonperfusion - pt wishes to return for laser treatment at follow up visit  3. No retinal edema on exam or OCT  4,5. Hypertensive retinopathy OU - discussed importance of tight BP control - contributing to pathology in #1 and #2 - monitor  6. Pseudophakia OU  - s/p  CE/IOL  - doing well  - monitor   Ophthalmic Meds Ordered this visit:  No orders of the defined types were placed in this encounter.      No follow-ups on file.  There are no Patient Instructions on file for this visit.   Explained the diagnoses, plan, and follow up with the patient and they expressed understanding.  Patient expressed understanding of the importance of proper follow up care.   This document serves as a record of services personally performed by Gardiner Sleeper, MD, PhD. It was created on their behalf by Ernest Mallick, OA, an ophthalmic assistant. The creation of this record is the provider's dictation and/or activities during the visit.    Electronically signed by: Ernest Mallick, OA  06/14/2017 8:27 AM    Gardiner Sleeper, M.D., Ph.D. Diseases & Surgery of the Retina and [place] 05.21.19  I have reviewed the above documentation for accuracy and completeness, and I agree with the above. Gardiner Sleeper, M.D., Ph.D. 06/17/17 8:30 AM     Abbreviations: M myopia (nearsighted); A astigmatism; H hyperopia (farsighted); P presbyopia; Mrx spectacle prescription;  CTL contact lenses; OD right eye; OS left eye; OU both eyes  XT exotropia; ET esotropia; PEK punctate epithelial keratitis; PEE punctate epithelial erosions; DES dry eye syndrome; MGD meibomian gland dysfunction; ATs artificial tears; PFAT's preservative free artificial tears; [place] nuclear sclerotic cataract; PSC posterior subcapsular cataract; ERM epi-retinal membrane; PVD posterior vitreous detachment; RD retinal detachment; DM diabetes mellitus; DR diabetic retinopathy; NPDR non-proliferative diabetic retinopathy; PDR proliferative diabetic retinopathy; CSME clinically significant macular edema; DME diabetic macular edema; dbh dot blot hemorrhages; CWS cotton wool spot; POAG primary open angle glaucoma; C/D cup-to-disc ratio; HVF humphrey visual field; GVF goldmann visual field;  OCT optical coherence tomography; IOP intraocular pressure; BRVO Branch retinal vein occlusion; CRVO central retinal vein occlusion; CRAO central retinal artery occlusion; BRAO branch retinal artery occlusion; RT retinal tear; SB scleral buckle; PPV pars plana vitrectomy; VH Vitreous hemorrhage; PRP panretinal laser photocoagulation; IVK intravitreal kenalog; VMT vitreomacular traction; MH Macular hole;  NVD neovascularization of the disc; NVE neovascularization elsewhere; AREDS age related eye disease study; ARMD age related macular degeneration; POAG primary open angle glaucoma; EBMD epithelial/anterior basement membrane dystrophy; ACIOL anterior chamber intraocular lens; IOL intraocular lens; PCIOL posterior chamber intraocular lens; Phaco/IOL phacoemulsification with intraocular lens placement; [place] photorefractive keratectomy; LASIK laser assisted in situ keratomileusis; HTN hypertension; DM diabetes mellitus; COPD chronic obstructive pulmonary disease

## 2017-06-15 ENCOUNTER — Ambulatory Visit (INDEPENDENT_AMBULATORY_CARE_PROVIDER_SITE_OTHER): Payer: Medicare Other | Admitting: Ophthalmology

## 2017-06-15 ENCOUNTER — Encounter (INDEPENDENT_AMBULATORY_CARE_PROVIDER_SITE_OTHER): Payer: Self-pay | Admitting: Ophthalmology

## 2017-06-15 DIAGNOSIS — I10 Essential (primary) hypertension: Secondary | ICD-10-CM

## 2017-06-15 DIAGNOSIS — H3581 Retinal edema: Secondary | ICD-10-CM | POA: Diagnosis not present

## 2017-06-15 DIAGNOSIS — H3562 Retinal hemorrhage, left eye: Secondary | ICD-10-CM | POA: Diagnosis not present

## 2017-06-15 DIAGNOSIS — H35033 Hypertensive retinopathy, bilateral: Secondary | ICD-10-CM

## 2017-06-15 DIAGNOSIS — H3500 Unspecified background retinopathy: Secondary | ICD-10-CM | POA: Diagnosis not present

## 2017-06-15 DIAGNOSIS — Z961 Presence of intraocular lens: Secondary | ICD-10-CM

## 2017-06-29 ENCOUNTER — Encounter (INDEPENDENT_AMBULATORY_CARE_PROVIDER_SITE_OTHER): Payer: Medicare Other | Admitting: Ophthalmology

## 2017-07-05 ENCOUNTER — Encounter: Payer: Medicare Other | Admitting: Internal Medicine

## 2017-07-07 DIAGNOSIS — I739 Peripheral vascular disease, unspecified: Secondary | ICD-10-CM | POA: Diagnosis not present

## 2017-07-07 DIAGNOSIS — I517 Cardiomegaly: Secondary | ICD-10-CM | POA: Diagnosis not present

## 2017-07-07 DIAGNOSIS — Z720 Tobacco use: Secondary | ICD-10-CM | POA: Diagnosis not present

## 2017-07-07 DIAGNOSIS — I10 Essential (primary) hypertension: Secondary | ICD-10-CM | POA: Diagnosis not present

## 2017-07-08 ENCOUNTER — Encounter: Payer: Self-pay | Admitting: Cardiology

## 2017-07-08 ENCOUNTER — Other Ambulatory Visit (HOSPITAL_COMMUNITY): Payer: Self-pay | Admitting: Cardiology

## 2017-07-08 DIAGNOSIS — I422 Other hypertrophic cardiomyopathy: Secondary | ICD-10-CM

## 2017-07-15 NOTE — Progress Notes (Signed)
[facility] Note  07/19/2017     CHIEF COMPLAINT Patient presents for Retina Follow Up   HISTORY OF PRESENT ILLNESS: Kylie Torres is a 72 y.o. female who presents to the clinic today for:   HPI    Retina Follow Up    Patient presents with  Other.  In left eye.  Severity is moderate.  Duration of 2 weeks.  Since onset it is stable.  I, the attending physician,  performed the HPI with the patient and updated documentation appropriately.          Comments    F/U pre-retinal hemorrhages OS; Pt states OU VA is stable; Pt states OU are comfortable; Pt denies any new floaters, denies any new flashes, denies any new wavy VA, denies any ocular pain;        Last edited by Bernarda Caffey, MD on 07/19/2017  1:53 PM. (History)    Pt states she has not had an MRI or had a scan of neck; Pt states she saw a cardiologist and was given an exam but was not "given an appointment for scan"; Pt denies any ocular changes, denies any headaches or neck pain; Pt denies checking BP at home;   Referring physician: Maryellen Pile, [place], [place] [id number]  HISTORICAL INFORMATION:   Selected notes from the MEDICAL RECORD NUMBER Referred by Dr. Gwendalyn Ege for concern of retinal hemorrhage OU LEE: 05.10.19 Mellody Memos) [BCVA: OD: 20/30-1 OS: 20/25] Ocular Hx-Glaucoma OU, Pseudophakia OU PMH-HTN, current smoker    CURRENT MEDICATIONS: Current Outpatient Medications (Ophthalmic Drugs)  Medication Sig  . brimonidine (ALPHAGAN) 0.2 % ophthalmic solution INT 1 GTT IN OD BID   No current facility-administered medications for this visit.  (Ophthalmic Drugs)   Current Outpatient Medications (Other)  Medication Sig  . amLODipine (NORVASC) 10 MG tablet Take 1 tablet (10 mg total) by mouth daily.  Marland Kitchen atorvastatin (LIPITOR) 20 MG tablet Take 1 tablet (20 mg total) by mouth daily.  . cilostazol (PLETAL) 100 MG tablet Take 1 tablet (100 mg total) by mouth 2  (two) times daily.  . diclofenac sodium (VOLTAREN) 1 % GEL Apply 2 g topically 4 (four) times daily.  . metoprolol tartrate (LOPRESSOR) 50 MG tablet Take 1 tablet (50 mg total) by mouth 2 (two) times daily.  . pantoprazole (PROTONIX) 40 MG tablet Take 1 tab every daily in the morning.  . valsartan-hydrochlorothiazide (DIOVAN-HCT) 160-12.5 MG tablet Take 2 tablets by mouth daily.   No current facility-administered medications for this visit.  (Other)      REVIEW OF SYSTEMS: ROS    Positive for: Musculoskeletal, Endocrine, Cardiovascular, Eyes   Negative for: Constitutional, Gastrointestinal, Neurological, Skin, Genitourinary, HENT, Respiratory, Psychiatric, Allergic/Imm, Heme/Lymph   Last edited by Cherrie Gauze, COA on 07/19/2017  1:18 PM. (History)       ALLERGIES No Known Allergies  PAST MEDICAL HISTORY Past Medical History:  Diagnosis Date  . Alcohol abuse   . Blood transfusion without reported diagnosis   . Cirrhosis ([place])   . Gastric ulcer   . Hypertension    Past Surgical History:  Procedure Laterality Date  . ABDOMINAL HYSTERECTOMY    . BREAST LUMPECTOMY    . CATARACT EXTRACTION    . ESOPHAGOGASTRODUODENOSCOPY (EGD) WITH PROPOFOL N/A 04/17/2015   Procedure: ESOPHAGOGASTRODUODENOSCOPY (EGD) WITH PROPOFOL;  Surgeon: Ladene Artist, MD;  Location: [facility];  Service: Endoscopy;  Laterality: N/A;  . EYE SURGERY    .  OOPHORECTOMY      FAMILY HISTORY Family History  Problem Relation Age of Onset  . Breast cancer Mother 85  . Heart attack Father   . Hypertension Father   . Hypertension Sister   . Diabetes Sister   . Breast cancer Sister 29  . Breast cancer Maternal Aunt        ? age of onset    SOCIAL HISTORY Social History   Tobacco Use  . Smoking status: Current Some Day Smoker    Packs/day: 0.30    Years: 0.03    Pack years: 0.00    Types: Cigarettes  . Smokeless tobacco: Never Used  . Tobacco comment: 1  pack every 3 days   Substance Use  Topics  . Alcohol use: No    Comment: quit drinking in 03/2015 - previously 1/5 gin every 2 days  . Drug use: No         OPHTHALMIC EXAM:  Base Eye Exam    Visual Acuity (Snellen - Linear)      Right Left   Dist [place] 20/25 20/20   Dist ph [place] 20/20 20/20       Tonometry (Tonopen, 1:22 PM)      Right Left   Pressure 16 14       Pupils      Dark Light Shape React APD   Right 4 3 Round Brisk None   Left 4 3 Round Brisk None       Visual Fields (Counting fingers)      Left Right    Full Full       Extraocular Movement      Right Left    Full, Ortho Full, Ortho       Neuro/Psych    Oriented x3:  Yes   Mood/Affect:  Normal       Dilation    Both eyes:  1.0% Mydriacyl, 2.5% Phenylephrine @ 1:22 PM        Slit Lamp and Fundus Exam    Slit Lamp Exam      Right Left   Lids/Lashes Dermatochalasis - upper lid Dermatochalasis - upper lid   Conjunctiva/Sclera Nasal and Temporal Pinguecula, Melanosis Nasal and Temporal Pinguecula, Melanosis   Cornea Arcus, Well healed cataract wounds Arcus, Well healed cataract wounds   Anterior Chamber Deep and quiet Deep and quiet   Iris Round and dilated Round and dilated   Lens PC IOL in good position PC IOL in good position   Vitreous Vitreous syneresis Vitreous syneresis       Fundus Exam      Right Left   Disc Pink and Sharp Pink and Sharp   C/D Ratio 0.5 0.3   Macula Good foveal reflex, mild Retinal pigment epithelial mottling, No heme or edema Good foveal reflex, Retinal pigment epithelial mottling, intraretinal hemorrhage superior to fovea   Vessels Mild Copper wiring, mild sheathing of surperior vessels running to 1200 Vascular attenuation, mild Copper wiring   Periphery Attached Attached, scattered blot hemorrhages, blot hemorrhage temporally          IMAGING AND PROCEDURES  Imaging and Procedures for @TODAY @  OCT, Retina - OU - Both Eyes       Right Eye Quality was good. Central Foveal Thickness: 259.  Progression has been stable. Findings include normal foveal contour, no IRF, no SRF, vitreomacular adhesion .   Left Eye Quality was good. Central Foveal Thickness: 259. Progression has been stable. Findings include normal foveal contour, no  IRF, no SRF, vitreomacular adhesion .   Notes *Images captured and stored on drive  Diagnosis / Impression:  NFP, No IRF/SRF, VMA OU  Clinical management:  See below  Abbreviations: NFP - Normal foveal profile. CME - cystoid macular edema. PED - pigment epithelial detachment. IRF - intraretinal fluid. SRF - subretinal fluid. EZ - ellipsoid zone. ERM - epiretinal membrane. ORA - outer retinal atrophy. ORT - outer retinal tubulation. SRHM - subretinal hyper-reflective material                  ASSESSMENT/PLAN:    ICD-10-CM   1. Retinal hemorrhage of left eye H35.62 OCT, Retina - OU - Both Eyes  2. 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 Retinal edema H35.81 OCT, Retina - OU - Both Eyes  4. Essential hypertension I10   5. Hypertensive retinopathy of both eyes H35.033   6. Pseudophakia of both eyes Z96.1     1. Preretinal hemorrhages OS - scattered peripheral blot hemes sparing macula - unclear etiology - FA 06/15/17 shows severely delayed filling times OS raising concern for ocular ischemia and possible compromise of carotid arteries - pt with significant cardiovascular history - discussed findings, prognosis and treatment options with pt and family - recommend carotid dopplers and further cardiovascular evaluation and optimization - letter sent to Dr. Filbert Schilder (Cardiologist) -- and carotid dopplers pending - F/u 4-6 weeks  2. Focal area of sclerotic / sheathed vessels superiorly OD with nonperfusion - unclear etiology - likely related to cardiovascular history, but differential includes inflammatory, autoimmune, infectious - recommend focal PRP to areas of non-perfusion - pt wishes to return for laser treatment at follow up  visit following vascular work up  3. No retinal edema on exam or OCT  4,5. Hypertensive retinopathy OU - discussed importance of tight BP control - contributing to pathology in #1 and #2 - monitor  6. Pseudophakia OU  - s/p CE/IOL  - doing well  - monitor   Ophthalmic Meds Ordered this visit:  No orders of the defined types were placed in this encounter.      Return in about 5 weeks (around 08/23/2017) for F/U RH OS, DFE, OCT.  There are no Patient Instructions on file for this visit.   Explained the diagnoses, plan, and follow up with the patient and they expressed understanding.  Patient expressed understanding of the importance of proper follow up care.   This document serves as a record of services personally performed by Gardiner Sleeper, MD, PhD. It was created on their behalf by Catha Brow, [place], a certified ophthalmic assistant. The creation of this record is the provider's dictation and/or activities during the visit.  Electronically signed by: Catha Brow, [place]  06.20.19 12:50 AM   Gardiner Sleeper, M.D., Ph.D. Diseases & Surgery of the Retina and Vitreous [facility]  I have reviewed the above documentation for accuracy and completeness, and I agree with the above. Gardiner Sleeper, M.D., Ph.D. 07/21/17 12:52 AM   Abbreviations: M myopia (nearsighted); A astigmatism; H hyperopia (farsighted); P presbyopia; Mrx spectacle prescription;  CTL contact lenses; OD right eye; OS left eye; OU both eyes  XT exotropia; ET esotropia; PEK punctate epithelial keratitis; PEE punctate epithelial erosions; DES dry eye syndrome; MGD meibomian gland dysfunction; ATs artificial tears; PFAT's preservative free artificial tears; [place] nuclear sclerotic cataract; PSC posterior subcapsular cataract; ERM epi-retinal membrane; PVD posterior vitreous detachment; RD retinal detachment; DM diabetes mellitus; DR diabetic retinopathy;  NPDR non-proliferative diabetic  retinopathy; PDR proliferative diabetic retinopathy; CSME clinically significant macular edema; DME diabetic macular edema; dbh dot blot hemorrhages; CWS cotton wool spot; POAG primary open angle glaucoma; C/D cup-to-disc ratio; HVF humphrey visual field; GVF goldmann visual field; OCT optical coherence tomography; IOP intraocular pressure; BRVO Branch retinal vein occlusion; CRVO central retinal vein occlusion; CRAO central retinal artery occlusion; BRAO branch retinal artery occlusion; RT retinal tear; SB scleral buckle; PPV pars plana vitrectomy; VH Vitreous hemorrhage; PRP panretinal laser photocoagulation; IVK intravitreal kenalog; VMT vitreomacular traction; MH Macular hole;  NVD neovascularization of the disc; NVE neovascularization elsewhere; AREDS age related eye disease study; ARMD age related macular degeneration; POAG primary open angle glaucoma; EBMD epithelial/anterior basement membrane dystrophy; ACIOL anterior chamber intraocular lens; IOL intraocular lens; PCIOL posterior chamber intraocular lens; Phaco/IOL phacoemulsification with intraocular lens placement; [place] photorefractive keratectomy; LASIK laser assisted in situ keratomileusis; HTN hypertension; DM diabetes mellitus; COPD chronic obstructive pulmonary disease

## 2017-07-19 ENCOUNTER — Ambulatory Visit (INDEPENDENT_AMBULATORY_CARE_PROVIDER_SITE_OTHER): Payer: Medicare Other | Admitting: Ophthalmology

## 2017-07-19 ENCOUNTER — Encounter (INDEPENDENT_AMBULATORY_CARE_PROVIDER_SITE_OTHER): Payer: Self-pay | Admitting: Ophthalmology

## 2017-07-19 DIAGNOSIS — H3500 Unspecified background retinopathy: Secondary | ICD-10-CM | POA: Diagnosis not present

## 2017-07-19 DIAGNOSIS — H3581 Retinal edema: Secondary | ICD-10-CM

## 2017-07-19 DIAGNOSIS — Z961 Presence of intraocular lens: Secondary | ICD-10-CM

## 2017-07-19 DIAGNOSIS — I10 Essential (primary) hypertension: Secondary | ICD-10-CM | POA: Diagnosis not present

## 2017-07-19 DIAGNOSIS — H3562 Retinal hemorrhage, left eye: Secondary | ICD-10-CM | POA: Diagnosis not present

## 2017-07-19 DIAGNOSIS — H35033 Hypertensive retinopathy, bilateral: Secondary | ICD-10-CM | POA: Diagnosis not present

## 2017-07-21 ENCOUNTER — Encounter (INDEPENDENT_AMBULATORY_CARE_PROVIDER_SITE_OTHER): Payer: Self-pay | Admitting: Ophthalmology

## 2017-07-25 ENCOUNTER — Encounter: Payer: Self-pay | Admitting: *Deleted

## 2017-08-04 DIAGNOSIS — I517 Cardiomegaly: Secondary | ICD-10-CM | POA: Diagnosis not present

## 2017-08-11 ENCOUNTER — Ambulatory Visit (HOSPITAL_COMMUNITY): Admission: RE | Admit: 2017-08-11 | Payer: Medicare Other | Source: Ambulatory Visit

## 2017-08-11 DIAGNOSIS — R0989 Other specified symptoms and signs involving the circulatory and respiratory systems: Secondary | ICD-10-CM | POA: Diagnosis not present

## 2017-08-18 DIAGNOSIS — I739 Peripheral vascular disease, unspecified: Secondary | ICD-10-CM | POA: Diagnosis not present

## 2017-08-18 DIAGNOSIS — Z720 Tobacco use: Secondary | ICD-10-CM | POA: Diagnosis not present

## 2017-08-18 DIAGNOSIS — I10 Essential (primary) hypertension: Secondary | ICD-10-CM | POA: Diagnosis not present

## 2017-08-18 DIAGNOSIS — I517 Cardiomegaly: Secondary | ICD-10-CM | POA: Diagnosis not present

## 2017-08-23 ENCOUNTER — Encounter (INDEPENDENT_AMBULATORY_CARE_PROVIDER_SITE_OTHER): Payer: Medicare Other | Admitting: Ophthalmology

## 2017-08-24 ENCOUNTER — Encounter (INDEPENDENT_AMBULATORY_CARE_PROVIDER_SITE_OTHER): Payer: Medicare Other | Admitting: Ophthalmology

## 2017-08-30 ENCOUNTER — Encounter (INDEPENDENT_AMBULATORY_CARE_PROVIDER_SITE_OTHER): Payer: Medicare Other | Admitting: Ophthalmology

## 2017-09-09 DIAGNOSIS — H401131 Primary open-angle glaucoma, bilateral, mild stage: Secondary | ICD-10-CM | POA: Diagnosis not present

## 2017-11-12 ENCOUNTER — Encounter: Payer: Self-pay | Admitting: Internal Medicine

## 2017-11-12 ENCOUNTER — Other Ambulatory Visit: Payer: Self-pay

## 2017-11-12 ENCOUNTER — Ambulatory Visit (INDEPENDENT_AMBULATORY_CARE_PROVIDER_SITE_OTHER): Payer: Medicare Other | Admitting: Internal Medicine

## 2017-11-12 VITALS — BP 182/92 | HR 63 | Temp 98.0°F | Ht 60.4 in | Wt 128.9 lb

## 2017-11-12 DIAGNOSIS — Z23 Encounter for immunization: Secondary | ICD-10-CM | POA: Diagnosis not present

## 2017-11-12 DIAGNOSIS — Z79899 Other long term (current) drug therapy: Secondary | ICD-10-CM

## 2017-11-12 DIAGNOSIS — I10 Essential (primary) hypertension: Secondary | ICD-10-CM | POA: Diagnosis not present

## 2017-11-12 MED ORDER — VALSARTAN-HYDROCHLOROTHIAZIDE 80-12.5 MG PO TABS: 1.0000 | ORAL_TABLET | Freq: Every day | ORAL | 11 refills | Status: DC

## 2017-11-12 NOTE — Patient Instructions (Signed)
Ms. Jane,   For blood pressure please start taking valsartan-HCTZ 1 tablet every day.  Continue taking Cardizem as usual.  Please come back in 4 weeks to check your blood pressure again and see if you need additional medication.  Please call us if you have any questions or concerns.  - Dr. Frederico Hamman

## 2017-11-13 LAB — BMP8+ANION GAP
ANION GAP: 15 mmol/L (ref 10.0–18.0)
BUN/Creatinine Ratio: 18 (ref 12–28)
BUN: 14 mg/dL (ref 8–27)
CO2: 23 mmol/L (ref 20–29)
CREATININE: 0.77 mg/dL (ref 0.57–1.00)
Calcium: 10.3 mg/dL (ref 8.7–10.3)
Chloride: 105 mmol/L (ref 96–106)
GFR calc Af Amer: 90 mL/min/{1.73_m2} (ref >59)
GFR, EST NON AFRICAN AMERICAN: 78 mL/min/{1.73_m2} (ref >59)
Glucose: 89 mg/dL (ref 65–99)
Potassium: 4.5 mmol/L (ref 3.5–5.2)
SODIUM: 143 mmol/L (ref 134–144)

## 2017-11-15 ENCOUNTER — Encounter: Payer: Self-pay | Admitting: Internal Medicine

## 2017-11-15 NOTE — Progress Notes (Signed)
Internal Medicine Clinic Attending  Case discussed with Dr. Santos-Sanchez at the time of the visit.  We reviewed the resident's history and exam and pertinent patient test results.  I agree with the assessment, diagnosis, and plan of care documented in the resident's note.  Alexander Raines, M.D., Ph.D.  

## 2017-11-15 NOTE — Progress Notes (Signed)
   CC: HTN follow up  HPI:  Kylie Torres is a 72 y.o. year-old female with PMH listed below who presents to clinic for HTN follow up. Please see problem based assessment and plan for further details.   Past Medical History:  Diagnosis Date  . Alcohol abuse   . Blood transfusion without reported diagnosis   . Cirrhosis ([place])   . Gastric ulcer   . Hypertension    Review of Systems:   Review of Systems  Constitutional: Negative for chills, fever and malaise/fatigue.  Eyes: Negative for blurred vision and double vision.  Respiratory: Negative for shortness of breath.   Cardiovascular: Negative for chest pain, palpitations and leg swelling.  Gastrointestinal: Negative for abdominal pain, constipation, diarrhea, nausea and vomiting.  Neurological: Negative for dizziness and headaches.    Physical Exam: Vitals:   11/12/17 1449 11/12/17 1519  BP: (!) 203/87 (!) 182/92  Pulse: 63   Temp: 98 F (36.7 C)   TempSrc: Oral   SpO2: 100%   Weight: 128 lb 14.4 oz (58.5 kg)   Height: 5' 0.4" (1.534 m)     General: Well-appearing elderly female in no acute distress, well-developed, well-nourished Cardiac: regular rate and rhythm, nl S1/S2, no murmurs, rubs or gallops  Pulm: CTAB, no wheezes or crackles, no increased work of breathing on room air  Ext: warm and well perfused, no peripheral edema    Office Visit from 11/12/2017 in [facility]  PHQ-9 Total Score  1      Assessment & Plan:   See Encounters Tab for problem based charting.  Patient discussed with Dr. Rebeca Alert

## 2017-11-15 NOTE — Assessment & Plan Note (Addendum)
Kylie Torres has a history of uncontrolled hypertension.  She used to be on metoprolol, valsartan-HCTZ, and amlodipine which she states was discontinued by her primary cardiologist recently.  She is now only on Cardizem 120 mg daily and her BP is 203/87. Repeat is 182/92.  Reports compliance with Cardizem and took it this AM. She is asymptomatic.  - Resume valsartan-HCTZ 1 tablet daily  - Continue Cardizem  - Will obtain Cardiology records and check refill history with her pharmacy as it has been inconsistent in the past  - Follow up in 4 weeks, will likely need further up titration. Recommend increasing val-HCTZ dose to BID if remains hypertensive

## 2017-11-17 DIAGNOSIS — Z720 Tobacco use: Secondary | ICD-10-CM | POA: Diagnosis not present

## 2017-11-17 DIAGNOSIS — I517 Cardiomegaly: Secondary | ICD-10-CM | POA: Diagnosis not present

## 2017-11-17 DIAGNOSIS — I739 Peripheral vascular disease, unspecified: Secondary | ICD-10-CM | POA: Diagnosis not present

## 2017-11-17 DIAGNOSIS — I10 Essential (primary) hypertension: Secondary | ICD-10-CM | POA: Diagnosis not present

## 2017-12-10 ENCOUNTER — Encounter: Payer: Self-pay | Admitting: Internal Medicine

## 2017-12-10 ENCOUNTER — Ambulatory Visit (INDEPENDENT_AMBULATORY_CARE_PROVIDER_SITE_OTHER): Payer: Medicare Other | Admitting: Internal Medicine

## 2017-12-10 VITALS — BP 166/80 | HR 80 | Temp 98.2°F | Wt 130.4 lb

## 2017-12-10 DIAGNOSIS — I10 Essential (primary) hypertension: Secondary | ICD-10-CM

## 2017-12-10 DIAGNOSIS — R011 Cardiac murmur, unspecified: Secondary | ICD-10-CM | POA: Diagnosis not present

## 2017-12-10 DIAGNOSIS — Z79899 Other long term (current) drug therapy: Secondary | ICD-10-CM | POA: Diagnosis not present

## 2017-12-10 DIAGNOSIS — Z0000 Encounter for general adult medical examination without abnormal findings: Secondary | ICD-10-CM

## 2017-12-10 NOTE — Progress Notes (Signed)
   CC:  Hypertension follow up   HPI:  [name] is a 72 y.o. year-old female with PMH listed below who presents to clinic for hypertension follow-up. Please see problem based assessment and plan for further details.   Past Medical History:  Diagnosis Date  . Alcohol abuse   . Blood transfusion without reported diagnosis   . Cirrhosis ([place])   . Gastric ulcer   . Hypertension    Review of Systems:   Review of Systems  Constitutional: Negative for chills, fever, malaise/fatigue and weight loss.  Eyes: Negative for blurred vision and double vision.  Respiratory: Negative for shortness of breath.   Cardiovascular: Negative for chest pain and leg swelling.  Neurological: Negative for dizziness and headaches.     Physical Exam:  Vitals:   12/10/17 1334 12/10/17 1344  BP: (!) 199/86 (!) 166/80  Pulse: 61 80  Temp: 98.2 F (36.8 C)   TempSrc: Oral   SpO2: 100%   Weight: 130 lb 6.4 oz (59.1 kg)     General: Elderly female, well-appearing, well-developed, well-nourished, no acute distress Cardiac: RRR, nl A2/Q3, II/VI systolic murmur best heard at upper sternal borders, no JVD  Pulm: CTAB, no wheezes or crackles, no increased work of breathing on room air  Ext: warm and well perfused, no peripheral edema bilaterally     Office Visit from 12/10/2017 in [facility]  PHQ-9 Total Score  3       Assessment & Plan:   See Encounters Tab for problem based charting.  Patient discussed with Dr. Lynnae January

## 2017-12-10 NOTE — Patient Instructions (Signed)
Ms. Doffing,   I will reach out to your heart doctor today to see which blood pressure medicine would benefit you the most.   I will give you a call to let you know what he says.   Please come back in 4 weeks to recheck your blood pressure.   Call us if you have any questions or concerns.   - Dr. Frederico Hamman

## 2017-12-10 NOTE — Assessment & Plan Note (Addendum)
Ms. Saksa presents for hypertension follow-up.  She was seen 4 weeks ago at which time she was on Cardizem 240 mg daily and was started on valsartan-HCTZ 80-12.5.  I received a message from her cardiologist shortly after. He recommends no further increase in valsartan-HCTZ as he is concern for LVOT obstruction in the setting of hypertrophic cardiomyopathy. She presents today stating her cardiologist stopped the Cardizem and she has only been taking valsartan-HCTZ. Her BP is 199/87 today. She is asymptomatic. I sent a message to Dr. Virgina Jock to clarify patient's BP regimen and to ask for recommendations for antihypertensive agents in hypertrophic CM. He is not sure why patient stopped Cardizem as he did not recommend this during her last visit. He states she needs negative chronotropic agents to reduce LVOT obstruction and blood pressure and that both BB or CCB would work fine as long as patient tolerates it.  - Continue valasartan-HCTZ at current dose.  - Will restart Cardizem again. Will call patient to discuss change in regimen.  - Follow up in 4 weeks with me

## 2017-12-11 LAB — BMP8+ANION GAP
ANION GAP: 14 mmol/L (ref 10.0–18.0)
BUN / CREAT RATIO: 18 (ref 12–28)
BUN: 13 mg/dL (ref 8–27)
CO2: 22 mmol/L (ref 20–29)
Calcium: 9.9 mg/dL (ref 8.7–10.3)
Chloride: 108 mmol/L — ABNORMAL HIGH (ref 96–106)
Creatinine, Ser: 0.72 mg/dL (ref 0.57–1.00)
GFR calc Af Amer: 98 mL/min/{1.73_m2} (ref >59)
GFR calc non Af Amer: 85 mL/min/{1.73_m2} (ref >59)
GLUCOSE: 99 mg/dL (ref 65–99)
Potassium: 4.4 mmol/L (ref 3.5–5.2)
Sodium: 144 mmol/L (ref 134–144)

## 2017-12-13 ENCOUNTER — Encounter: Payer: Self-pay | Admitting: Internal Medicine

## 2017-12-13 NOTE — Assessment & Plan Note (Signed)
Patient is due for DEXA scan. Will discuss at next appointment in 4 weeks.

## 2017-12-15 NOTE — Progress Notes (Signed)
Internal Medicine Clinic Attending  Case discussed with Dr. Santos-Sanchez at the time of the visit.  We reviewed the resident's history and exam and pertinent patient test results.  I agree with the assessment, diagnosis, and plan of care documented in the resident's note.    

## 2017-12-19 ENCOUNTER — Encounter: Payer: Self-pay | Admitting: *Deleted

## 2018-01-14 ENCOUNTER — Encounter: Payer: Medicare Other | Admitting: Internal Medicine

## 2018-01-17 ENCOUNTER — Encounter: Payer: Medicare Other | Admitting: Internal Medicine

## 2018-02-03 ENCOUNTER — Other Ambulatory Visit: Payer: Self-pay | Admitting: Internal Medicine

## 2018-02-03 DIAGNOSIS — Z1231 Encounter for screening mammogram for malignant neoplasm of breast: Secondary | ICD-10-CM

## 2018-02-09 DIAGNOSIS — I739 Peripheral vascular disease, unspecified: Secondary | ICD-10-CM | POA: Diagnosis not present

## 2018-02-09 DIAGNOSIS — I10 Essential (primary) hypertension: Secondary | ICD-10-CM | POA: Diagnosis not present

## 2018-02-09 DIAGNOSIS — Z720 Tobacco use: Secondary | ICD-10-CM | POA: Diagnosis not present

## 2018-02-09 DIAGNOSIS — I517 Cardiomegaly: Secondary | ICD-10-CM | POA: Diagnosis not present

## 2018-02-18 ENCOUNTER — Encounter: Payer: Medicare Other | Admitting: Internal Medicine

## 2018-03-03 ENCOUNTER — Ambulatory Visit: Payer: Medicare Other

## 2018-03-11 ENCOUNTER — Other Ambulatory Visit: Payer: Self-pay

## 2018-03-11 ENCOUNTER — Ambulatory Visit (INDEPENDENT_AMBULATORY_CARE_PROVIDER_SITE_OTHER): Payer: Medicare Other | Admitting: Internal Medicine

## 2018-03-11 VITALS — BP 197/89 | HR 69 | Temp 97.7°F | Wt 133.4 lb

## 2018-03-11 DIAGNOSIS — I10 Essential (primary) hypertension: Secondary | ICD-10-CM

## 2018-03-11 MED ORDER — METOPROLOL SUCCINATE ER 25 MG PO TB24: 25.0000 mg | ORAL_TABLET | Freq: Every day | ORAL | 0 refills | Status: DC

## 2018-03-11 NOTE — Patient Instructions (Addendum)
Ms. Strassner,   Your blood pressure is very high today and we will need to start you on blood pressure medication.  I sent a prescription for metoprolol to your pharmacy.  You will take 1 tablet (25 mg) every day once a day.  Please make a follow-up appointment with me in 4 weeks to check up on your blood pressure.  Please give [place] a call if you have any questions or experience any side effects from the medication.  -Dr. Frederico Hamman

## 2018-03-12 ENCOUNTER — Encounter: Payer: Self-pay | Admitting: Internal Medicine

## 2018-03-12 NOTE — Progress Notes (Signed)
   CC: HTN follow up  HPI:  [name] is a 73 y.o. year-old female with PMH listed below who presents to clinic for HTN follow up. Please see problem based assessment and plan for further details.   Past Medical History:  Diagnosis Date  . Alcohol abuse   . Blood transfusion without reported diagnosis   . Cirrhosis ([place])   . Gastric ulcer   . Hypertension    Review of Systems:   Review of Systems  Constitutional: Negative for chills, fever, malaise/fatigue and weight loss.  Eyes: Negative for blurred vision and double vision.  Respiratory: Negative for shortness of breath.   Cardiovascular: Negative for chest pain, palpitations and leg swelling.  Gastrointestinal: Negative for abdominal pain.  Neurological: Negative for dizziness, sensory change, weakness and headaches.   Physical Exam: Vitals:   03/11/18 1559  BP: (!) 197/89  Pulse: 69  Temp: 97.7 F (36.5 C)  TempSrc: Oral  SpO2: 99%  Weight: 133 lb 6.4 oz (60.5 kg)   General: well-appearing female in NAD  Cardiac: regular rate and rhythm, II/VI systolic murmur heard throughout   Pulm: CTAB, no wheezes or crackles, no increased work of breathing on room air  Ext: warm and well perfused, no peripheral edema     Assessment & Plan:   See Encounters Tab for problem based charting.  Patient discussed with Dr. Lynnae January

## 2018-03-12 NOTE — Assessment & Plan Note (Signed)
Kylie Torres presents for HTN follow up. Her grand-daughter is present in the room today and reports patient has not been taking any blood pressure medications in a long time. Patient reports she tried Cardizem but it constipated her and she stopped taking it. Her BP today is 197/89. She is asymptomatic. Discussed risks of uncontrolled HTN including but not limited to MI, stroke, and kidney disease. Due to echocardiogram  findings concerning for hypertrophic cardiomyopathy we will avoid diuretics due to concern for LVOT obstruction. Patient would not like to resume Cardizem. Will try metoprolol XL 25 mg QD. HR 75 today and should be able to tolerate dose. However, return precautions discussed. Also advised her to call us with any symptoms she experiences after initiating medication as they may not be related to the medicine. Follow up in 4 weeks.

## 2018-03-14 NOTE — Progress Notes (Signed)
Internal Medicine Clinic Attending  Case discussed with Dr. Santos-Sanchez at the time of the visit.  We reviewed the resident's history and exam and pertinent patient test results.  I agree with the assessment, diagnosis, and plan of care documented in the resident's note.    

## 2018-03-29 ENCOUNTER — Ambulatory Visit: Payer: Medicare Other

## 2018-04-22 ENCOUNTER — Encounter: Payer: Medicare Other | Admitting: Internal Medicine

## 2018-04-26 ENCOUNTER — Ambulatory Visit: Payer: Medicare Other

## 2018-04-28 ENCOUNTER — Ambulatory Visit: Payer: Medicare Other

## 2018-05-25 ENCOUNTER — Other Ambulatory Visit: Payer: Self-pay

## 2018-05-25 ENCOUNTER — Ambulatory Visit (INDEPENDENT_AMBULATORY_CARE_PROVIDER_SITE_OTHER): Payer: Medicare Other | Admitting: Internal Medicine

## 2018-05-25 DIAGNOSIS — I10 Essential (primary) hypertension: Secondary | ICD-10-CM | POA: Diagnosis not present

## 2018-05-25 DIAGNOSIS — Z79899 Other long term (current) drug therapy: Secondary | ICD-10-CM

## 2018-05-25 MED ORDER — METOPROLOL SUCCINATE ER 25 MG PO TB24: 25.0000 mg | ORAL_TABLET | Freq: Every day | ORAL | 0 refills | Status: DC

## 2018-05-26 ENCOUNTER — Encounter: Payer: Self-pay | Admitting: Internal Medicine

## 2018-05-26 NOTE — Progress Notes (Signed)
  [facility] Telephone Encounter Continuity Care Appointment  HPI:   This telephone encounter was created for [name] on 05/26/2018 for the following purpose/cc: Follow up for HTN.   Past Medical History:  Past Medical History:  Diagnosis Date  . Alcohol abuse   . Blood transfusion without reported diagnosis   . Cirrhosis ([place])   . Gastric ulcer   . Hypertension       ROS:   Patient denies fever, chills, HA, changes in vision, chest pain, SOB, and lower extremity swelling.    Assessment / Plan / Recommendations:   Please see A&P under problem oriented charting for assessment of the patient's acute and chronic medical conditions.   As always, pt is advised that if symptoms worsen or new symptoms arise, they should go to an urgent care facility or to to ER for further evaluation.   Consent and Medical Decision Making:   Patient discussed with Dr. Dareen Piano  This is a telephone encounter between Kylie Torres and Kylie Torres on 05/26/2018 for medical conditions stated above. The visit was conducted with the patient located at home and Kylie Torres at [facility]. The patient's identity was confirmed using their DOB and current address. The patient has consented to being evaluated through a telephone encounter and understands the associated risks (an examination cannot be done and the patient may need to come in for an appointment) / benefits (allows the patient to remain at home, decreasing exposure to coronavirus). I personally spent 8 minutes on medical discussion.

## 2018-05-26 NOTE — Assessment & Plan Note (Signed)
Patient has a history of uncontrolled HTN due to medication non-compliance. She was started on metoprolol XL 25 mg QD during her last visit to help with this issue. She reports no issues or side effects from the medication. States she takes it daily as prescribed. Has been checking her BP at home, sBP 160-180s. She is asymptomatic. Ideally would titrate metoprolol up or add CCB as recommended by her cardiologist. However, patient is unable to check her pulse which was 75 prior to starting BB and she also declines addition of a new medication at this time. Will therefore continue metoprolol at current dose and scheduled her for an in-person appt in 1 month.

## 2018-05-27 ENCOUNTER — Encounter: Payer: Medicare Other | Admitting: Internal Medicine

## 2018-05-27 NOTE — Progress Notes (Signed)
Internal Medicine Clinic Attending  Case discussed with Dr. Santos-Sanchez at the time of the visit.  We reviewed the resident's history and exam and pertinent patient test results.  I agree with the assessment, diagnosis, and plan of care documented in the resident's note.    

## 2018-06-13 ENCOUNTER — Ambulatory Visit: Payer: Medicare Other

## 2018-07-04 DIAGNOSIS — H43813 Vitreous degeneration, bilateral: Secondary | ICD-10-CM | POA: Diagnosis not present

## 2018-07-19 NOTE — Progress Notes (Signed)
[facility] Note  07/20/2018     CHIEF COMPLAINT Patient presents for Retina Evaluation   HISTORY OF PRESENT ILLNESS: GENAVIE BOETTGER is a 73 y.o. female who presents to the clinic today for:   HPI    Retina Evaluation    In right eye.  This started 2 weeks ago.  Duration of 2 weeks.  Associated Symptoms Floaters.  Negative for Flashes, Blind Spot, Photophobia, Scalp Tenderness, Fever, Pain, Glare, Jaw Claudication, Weight Loss, Distortion, Redness, Trauma, Shoulder/Hip pain and Fatigue.  Context:  distance vision, mid-range vision and near vision.  Treatments tried include no treatments.  I, the attending physician,  performed the HPI with the patient and updated documentation appropriately.          Comments    Patient states has had blurry vision OD for the past 2 weeks. Some new floaters OD for the past 2 weeks as well. No flashes. History of retinal heme OS last year. Patient states blood pressure fluctuates a lot. No history of diabetes.        Last edited by Bernarda Caffey, MD on 07/20/2018  5:36 PM. (History)    Pt states she saw Dr. Wynetta Emery recently and was told she needs to come back here, she states she feels like her vision is doing okay, except for some floaters, pt states she goes to see her cardiologist next month   Referring physician: Gwendalyn Ege, [address] [place],  [place] [id number]  HISTORICAL INFORMATION:   Selected notes from the [facility] Referred by Dr. Gwendalyn Ege for concern of retinal hemorrhage OU LEE: 05.10.19 Mellody Memos) [BCVA: OD: 20/30-1 OS: 20/25] Ocular Hx-Glaucoma OU, Pseudophakia OU PMH-HTN, current smoker    CURRENT MEDICATIONS: Current Outpatient Medications (Ophthalmic Drugs)  Medication Sig  . brimonidine (ALPHAGAN) 0.2 % ophthalmic solution Place 1 drop into both eyes 2 (two) times a day.   . prednisoLONE acetate (PRED FORTE) 1 % ophthalmic suspension Place 1 drop into the  right eye 4 (four) times daily for 7 days.   No current facility-administered medications for this visit.  (Ophthalmic Drugs)   Current Outpatient Medications (Other)  Medication Sig  . metoprolol succinate (TOPROL-XL) 25 MG 24 hr tablet Take 1 tablet (25 mg total) by mouth daily.   No current facility-administered medications for this visit.  (Other)      REVIEW OF SYSTEMS: ROS    Positive for: Musculoskeletal, Endocrine, Cardiovascular, Eyes   Negative for: Constitutional, Gastrointestinal, Neurological, Skin, Genitourinary, HENT, Respiratory, Psychiatric, Allergic/Imm, Heme/Lymph   Last edited by Roselee Nova D on 07/20/2018  1:18 PM. (History)       ALLERGIES No Known Allergies  PAST MEDICAL HISTORY Past Medical History:  Diagnosis Date  . Alcohol abuse   . Blood transfusion without reported diagnosis   . Cirrhosis ([place])   . Gastric ulcer   . Hypertension    Past Surgical History:  Procedure Laterality Date  . ABDOMINAL HYSTERECTOMY    . BREAST LUMPECTOMY    . CATARACT EXTRACTION Bilateral    Dr. Katy Fitch  . ESOPHAGOGASTRODUODENOSCOPY (EGD) WITH PROPOFOL N/A 04/17/2015   Procedure: ESOPHAGOGASTRODUODENOSCOPY (EGD) WITH PROPOFOL;  Surgeon: Ladene Artist, MD;  Location: [facility];  Service: Endoscopy;  Laterality: N/A;  . EYE SURGERY    . OOPHORECTOMY      FAMILY HISTORY Family History  Problem Relation Age of Onset  . Breast cancer Mother 72  . Heart attack Father   .  Hypertension Father   . Hypertension Sister   . Diabetes Sister   . Breast cancer Sister 19  . Breast cancer Maternal Aunt        ? age of onset    SOCIAL HISTORY Social History   Tobacco Use  . Smoking status: Current Some Day Smoker    Packs/day: 0.30    Years: 0.03    Pack years: 0.00    Types: Cigarettes  . Smokeless tobacco: Never Used  . Tobacco comment: 1  pack every 3 days   Substance Use Topics  . Alcohol use: No    Comment: quit drinking in 03/2015 - previously 1/5 gin  every 2 days  . Drug use: No         OPHTHALMIC EXAM:  Base Eye Exam    Visual Acuity (Snellen - Linear)      Right Left   Dist [place] 20/30 +2 20/20 -1   Dist ph [place] 20/25 +1        Tonometry (Tonopen, 1:30 PM)      Right Left   Pressure 18 16       Pupils      Dark Light Shape React APD   Right 3 2 Round Brisk None   Left 3 2 Round Brisk None       Visual Fields (Counting fingers)      Left Right    Full Full       Extraocular Movement      Right Left    Full, Ortho Full, Ortho       Neuro/Psych    Oriented x3: Yes   Mood/Affect: Normal       Dilation    Both eyes: 1.0% Mydriacyl, 2.5% Phenylephrine @ 1:30 PM        Slit Lamp and Fundus Exam    Slit Lamp Exam      Right Left   Lids/Lashes Dermatochalasis - upper lid, mild Ptosis Dermatochalasis - upper lid, mild Ptosis   Conjunctiva/Sclera Nasal and Temporal Pinguecula, Melanosis Nasal and Temporal Pinguecula, Melanosis   Cornea Arcus, Well healed cataract wounds, mild EBMD Arcus, Well healed cataract wounds, 1+ Punctate epithelial erosions, mild EBMD   Anterior Chamber Deep and quiet Deep and quiet   Iris Round and dilated Round and dilated   Lens PC IOL in good position PC IOL in good position   Vitreous Vitreous syneresis Vitreous syneresis       Fundus Exam      Right Left   Disc trace pallor, sharp rim trace pallor, sharp rim   C/D Ratio 0.4 0.4   Macula Good foveal reflex, mild Retinal pigment epithelial mottling, No heme or edema Flat, Good foveal reflex, mild Retinal pigment epithelial mottling   Vessels Vascular attenuation, Tortuous, sclerotic superior arteriole with +sheathing Vascular attenuation, mild Copper wiring, mild AV crossing changes   Periphery Attached, cluster of blot hemes at 1200 midzone Attached, No heme         Refraction    Manifest Refraction      Sphere Cylinder Axis Dist VA   Right -0.25 Sphere  20/25+2   Left -0.50 +0.25 180 20/20          IMAGING AND  PROCEDURES  Imaging and Procedures for @TODAY @  OCT, Retina - OU - Both Eyes       Right Eye Quality was good. Central Foveal Thickness: 257. Progression has been stable. Findings include normal foveal contour, no IRF, no SRF (Interval  release of VMA).   Left Eye Quality was good. Central Foveal Thickness: 256. Progression has been stable. Findings include normal foveal contour, no IRF, no SRF, vitreomacular adhesion .   Notes *Images captured and stored on drive  Diagnosis / Impression:  NFP, No IRF/SRF, VMA OU  Clinical management:  See below  Abbreviations: NFP - Normal foveal profile. CME - cystoid macular edema. PED - pigment epithelial detachment. IRF - intraretinal fluid. SRF - subretinal fluid. EZ - ellipsoid zone. ERM - epiretinal membrane. ORA - outer retinal atrophy. ORT - outer retinal tubulation. SRHM - subretinal hyper-reflective material         Fluorescein Angiography Optos (Transit OS)       Right Eye   Progression has worsened. Early phase findings include vascular perfusion defect. Mid/Late phase findings include vascular perfusion defect, microaneurysm, leakage (Capillary drop out and telangiectasia along superior arcade).   Left Eye   Progression has been stable. Early phase findings include delayed filling, staining. Mid/Late phase findings include leakage, staining.   Notes Images stored on drive;   Impression: OD: focal area of peripheral nonperfusion superior with microaneurysms and neovascular, hyperfluorescent leakage OS: delayed filling with venous filling not occurring until 35-40 seconds; focal areas of late hyperfluorescent leakage inf temp periphery -- mild         Panretinal Photocoagulation - OD - Right Eye       LASER PROCEDURE NOTE  Diagnosis:   Branch retinal vein occlusion w/ capillary nonperfusion and early NV, RIGHT EYE  Procedure:  Sectoral pan-retinal photocoagulation using slit lamp laser, RIGHT EYE  Anesthesia:   Topical  Surgeon: Bernarda Caffey, MD, PhD   Informed consent obtained, operative eye marked, and time out performed prior to initiation of laser.   Lumenis [id number] slit lamp laser Pattern: 3x3 square Power: 270 mW Duration: 30 msec  Spot size: 200 microns  # spots: 375 spots superior quadrant  Complications: None.  RTC: 4 wks  Patient tolerated the procedure well and received written and verbal post-procedure care information/education.                  ASSESSMENT/PLAN:    ICD-10-CM   1. 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 Retinal hemorrhage of right eye  H35.61 Fluorescein Angiography Optos (Transit OS)  3. Ocular ischemic syndrome  H35.82 Fluorescein Angiography Optos (Transit OS)    Panretinal Photocoagulation - OD - Right Eye  4. Retinal edema  H35.81 OCT, Retina - OU - Both Eyes  5. Essential hypertension  I10   6. Hypertensive retinopathy of both eyes  H35.033 Fluorescein Angiography Optos (Transit OS)  7. Pseudophakia of both eyes  Z96.1     1,2. 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 No retinal edema on exam or OCT  5,6. Hypertensive retinopathy OU  - discussed importance of tight BP control  - contributing to pathology in #1 and #2  - monitor  7. Pseudophakia OU  - s/p CE/IOL  - doing well  - monitor   Ophthalmic Meds Ordered this visit:  Meds ordered this encounter  Medications  . prednisoLONE acetate (PRED FORTE) 1 % ophthalmic suspension    Sig: Place 1 drop into the right eye 4 (four) times daily for 7 days.    Dispense:  10 mL    Refill:  0       Return in about 4 weeks (around 08/17/2018) for s/p PRP OD, DFE, OCT.  There are no Patient Instructions on file for this visit.   Explained the diagnoses, plan, and follow up with the patient and they expressed understanding.  Patient expressed understanding of the importance of proper follow up care.   This document serves as a record of services personally performed by Gardiner Sleeper, MD, PhD. It was created on their behalf by Ernest Mallick, OA, an ophthalmic assistant. The creation of this record is the provider's dictation and/or activities during the visit.    Electronically signed by: Ernest Mallick, OA  06.23.2020 5:44 PM    Gardiner Sleeper, M.D., Ph.D. Diseases & Surgery of the Retina and Vitreous [facility]  I have reviewed the above documentation for accuracy and completeness, and I agree with the above. Gardiner Sleeper, M.D., Ph.D. 07/20/18 5:44 PM    Abbreviations: M myopia (nearsighted); A astigmatism; H hyperopia (farsighted); P presbyopia; Mrx spectacle prescription;  CTL contact lenses; OD right eye; OS left eye; OU both eyes  XT exotropia; ET esotropia; PEK punctate epithelial keratitis; PEE  punctate epithelial erosions; DES dry eye syndrome; MGD meibomian gland dysfunction; ATs artificial tears; PFAT's preservative free artificial tears; [place] nuclear sclerotic cataract; PSC posterior subcapsular cataract; ERM epi-retinal membrane; PVD posterior vitreous detachment; RD retinal detachment; DM diabetes mellitus; DR diabetic retinopathy; NPDR non-proliferative diabetic retinopathy; PDR proliferative diabetic retinopathy; CSME clinically significant macular edema; DME diabetic macular edema; dbh dot blot hemorrhages; CWS cotton wool spot; POAG primary open angle glaucoma; C/D cup-to-disc ratio; HVF humphrey visual field; GVF goldmann visual field; OCT optical coherence tomography; IOP intraocular pressure; BRVO Branch retinal vein occlusion; CRVO central retinal vein occlusion; CRAO central retinal artery occlusion; BRAO branch retinal artery occlusion; RT retinal tear; SB scleral buckle; PPV pars plana vitrectomy; VH Vitreous hemorrhage; PRP panretinal laser photocoagulation; IVK intravitreal kenalog; VMT vitreomacular traction; MH Macular hole;  NVD neovascularization of the disc; NVE neovascularization elsewhere; AREDS age related eye disease study; ARMD age related macular degeneration; POAG primary open angle glaucoma; EBMD epithelial/anterior basement membrane dystrophy; ACIOL anterior chamber intraocular lens; IOL intraocular lens; PCIOL posterior chamber intraocular lens; Phaco/IOL phacoemulsification with intraocular lens placement; [place] photorefractive keratectomy; LASIK laser assisted in situ keratomileusis; HTN hypertension; DM diabetes mellitus; COPD chronic obstructive pulmonary disease

## 2018-07-20 ENCOUNTER — Ambulatory Visit (INDEPENDENT_AMBULATORY_CARE_PROVIDER_SITE_OTHER): Payer: Medicare Other | Admitting: Ophthalmology

## 2018-07-20 ENCOUNTER — Encounter (INDEPENDENT_AMBULATORY_CARE_PROVIDER_SITE_OTHER): Payer: Self-pay | Admitting: Ophthalmology

## 2018-07-20 ENCOUNTER — Telehealth (INDEPENDENT_AMBULATORY_CARE_PROVIDER_SITE_OTHER): Payer: Self-pay

## 2018-07-20 ENCOUNTER — Other Ambulatory Visit: Payer: Self-pay

## 2018-07-20 DIAGNOSIS — H3561 Retinal hemorrhage, right eye: Secondary | ICD-10-CM

## 2018-07-20 DIAGNOSIS — I10 Essential (primary) hypertension: Secondary | ICD-10-CM

## 2018-07-20 DIAGNOSIS — H35033 Hypertensive retinopathy, bilateral: Secondary | ICD-10-CM

## 2018-07-20 DIAGNOSIS — Z961 Presence of intraocular lens: Secondary | ICD-10-CM

## 2018-07-20 DIAGNOSIS — H3582 Retinal ischemia: Secondary | ICD-10-CM

## 2018-07-20 DIAGNOSIS — H3581 Retinal edema: Secondary | ICD-10-CM | POA: Diagnosis not present

## 2018-07-20 DIAGNOSIS — H348311 Tributary (branch) retinal vein occlusion, right eye, with retinal neovascularization: Secondary | ICD-10-CM | POA: Diagnosis not present

## 2018-07-20 MED ORDER — PREDNISOLONE ACETATE 1 % OP SUSP: 1.0000 [drp] | Freq: Four times a day (QID) | OPHTHALMIC | 0 refills | Status: AC

## 2018-07-22 ENCOUNTER — Ambulatory Visit: Payer: Medicare Other

## 2018-07-26 ENCOUNTER — Ambulatory Visit
Admission: RE | Admit: 2018-07-26 | Discharge: 2018-07-26 | Disposition: A | Discharge status: Home / Self Care | Payer: Medicare Other | Source: Ambulatory Visit | Attending: Internal Medicine | Admitting: Internal Medicine

## 2018-07-26 ENCOUNTER — Other Ambulatory Visit: Payer: Self-pay

## 2018-07-26 DIAGNOSIS — Z1231 Encounter for screening mammogram for malignant neoplasm of breast: Secondary | ICD-10-CM

## 2018-08-10 ENCOUNTER — Other Ambulatory Visit: Payer: Self-pay

## 2018-08-10 ENCOUNTER — Ambulatory Visit (INDEPENDENT_AMBULATORY_CARE_PROVIDER_SITE_OTHER): Payer: Medicare Other | Admitting: Cardiology

## 2018-08-10 ENCOUNTER — Encounter: Payer: Self-pay | Admitting: Cardiology

## 2018-08-10 VITALS — BP 140/70 | Ht 63.0 in | Wt 134.0 lb

## 2018-08-10 DIAGNOSIS — I10 Essential (primary) hypertension: Secondary | ICD-10-CM | POA: Diagnosis not present

## 2018-08-10 NOTE — Progress Notes (Signed)
Virtual Visit via Video Note   Subjective:   Kylie Torres, female    DOB: [birth year], 73 y.o.   MRN: [id number]  I connected with the patient on 08/10/2018 by a telephone call and verified that I am speaking with the correct person using two identifiers.     I offered the patient a video enabled application for a virtual visit. Unfortunately, this could not be accomplished due to technical difficulties/lack of video enabled phone/computer. I discussed the limitations of evaluation and management by telemedicine and the availability of in person appointments. The patient expressed understanding and agreed to proceed.   This visit type was conducted due to national recommendations for restrictions regarding the COVID-19 Pandemic (e.g. social distancing).  This format is felt to be most appropriate for this patient at this time.  All issues noted in this document were discussed and addressed.  No physical exam was performed (except for noted visual exam findings with Tele health visits).  The patient has consented to conduct a Tele health visit and understands insurance will be billed.    Chief complaint:  Hypertension   HPI  73 year old African-American female with hypertension, tobacco abuse, history of alcohol abuse, LVH suspicious for hypertrophic myopathy, stable claudication, here for follow-up.  Since her last visit, she has undergone laser surgery for her retina. Had laser eye surgery for her retina. She does not check her BP regularly at home. BP was 140/?, as checked 2 weeks ago by [place] Program[employer], applications. It appears that she is only taking metoprolol succinate for her hypertension.  She denies chest pain, shortness of breath, palpitations, leg edema, orthopnea, PND, TIA/syncope.  Past Medical History:  Diagnosis Date  . Alcohol abuse   . Blood transfusion without reported diagnosis   . Cirrhosis ([place])   . Gastric ulcer   . Hypertension      Past Surgical History:   Procedure Laterality Date  . ABDOMINAL HYSTERECTOMY    . BREAST EXCISIONAL BIOPSY Left 1971   benign  . CATARACT EXTRACTION Bilateral    Dr. Katy Fitch  . ESOPHAGOGASTRODUODENOSCOPY (EGD) WITH PROPOFOL N/A 04/17/2015   Procedure: ESOPHAGOGASTRODUODENOSCOPY (EGD) WITH PROPOFOL;  Surgeon: Ladene Artist, MD;  Location: [facility];  Service: Endoscopy;  Laterality: N/A;  . EYE SURGERY    . OOPHORECTOMY       Social History   Socioeconomic History  . Marital status: Single    Spouse name: Not on file  . Number of children: 3  . Years of education: Not on file  . Highest education level: Not on file  Occupational History  . Occupation: retired  Scientific laboratory technician  . Financial resource strain: Not on file  . Food insecurity    Worry: Not on file    Inability: Not on file  . Transportation needs    Medical: Not on file    Non-medical: Not on file  Tobacco Use  . Smoking status: Current Some Day Smoker    Packs/day: 0.30    Years: 0.03    Pack years: 0.00    Types: Cigarettes  . Smokeless tobacco: Never Used  . Tobacco comment: 1  pack every 3 days   Substance and Sexual Activity  . Alcohol use: No    Comment: quit drinking in 03/2015 - previously 1/5 gin every 2 days  . Drug use: No  . Sexual activity: Not on file  Lifestyle  . Physical activity    Days per week: Not on file  Minutes per session: Not on file  . Stress: Not on file  Relationships  . Social Herbalist on phone: Not on file    Gets together: Not on file    Attends religious service: Not on file    Active member of club or organization: Not on file    Attends meetings of clubs or organizations: Not on file    Relationship status: Not on file  . Intimate partner violence    Fear of current or ex partner: Not on file    Emotionally abused: Not on file    Physically abused: Not on file    Forced sexual activity: Not on file  Other Topics Concern  . Not on file  Social History Narrative  . Not on  file     Family History  Problem Relation Age of Onset  . Breast cancer Mother 57  . Heart attack Father   . Hypertension Father   . Hypertension Sister   . Diabetes Sister   . Breast cancer Sister 26  . Breast cancer Maternal Aunt        ? age of onset     Current Outpatient Medications on File Prior to Visit  Medication Sig Dispense Refill  . brimonidine (ALPHAGAN) 0.2 % ophthalmic solution daily.    Marland Kitchen diltiazem (DILACOR XR) 240 MG 24 hr capsule Take 240 mg by mouth daily.    Marland Kitchen latanoprost (XALATAN) 0.005 % ophthalmic solution 1 drop as needed.    . pantoprazole (PROTONIX) 40 MG tablet Take 40 mg by mouth daily.    . valsartan-hydrochlorothiazide (DIOVAN-HCT) 80-12.5 MG tablet Take 1 tablet by mouth daily.    . brimonidine (ALPHAGAN) 0.2 % ophthalmic solution Place 1 drop into both eyes 2 (two) times a day.   4  . metoprolol succinate (TOPROL-XL) 25 MG 24 hr tablet Take 1 tablet (25 mg total) by mouth daily. 90 tablet 0   No current facility-administered medications on file prior to visit.     Cardiovascular studies:  Treadmill exercise stress test 08/04/2017: Indication: Hypertropic Cardiomyopathy  The patient exercised on Bruce protocol for 2:09 min. Patient achieved  4.64 METS and reached HR  115 bpm, which is   76% of maximum age-predicted HR.   Resting EKG demonstrates NSR, LVH with repolarization abnormality.  Stress EKG is non diagnostic due to submaximal exercise and HR.  Stress test terminated due to Leg pain and fatigue per patient. Normal BP response.   Rec : Non diagnostic stress EKG with marked reduction in exercise tolerence. Consider different modality of testing.  Echocardiogram 05/19/2017: 1. Left ventricle cavity is normal in size. Severe concentric hypertrophy of the left ventricle. Normal global wall motion. Peak LVOY gradient- 9 mm of Hg. Visual EF is 65-70%. 2. Left atrial cavity is mildly dilated. 3. Mild (Grade I) mitral regurgitation. Mild  calcification of the mitral valve annulus. 4. Mild to moderate tricuspid regurgitation. Mild pulmonary hypertension with approx. PA syst. pressure of 40 mm of Hg. 5. Small to moderate pericardial effusion. There is no hemodynamic significance. 6. c.f. echo. of 01/27/2017, PA pressure is slightly higher, no other diagnostic change.  Lexiscan myoview stress test 08/26/2015: 1. Patient initially attempted treadmill excess stress test.  Unable to complete due to dyspnea, leg fatigue, stress test changed to pharmacologic stress test.  Resting EKG demonstrated normal sinus rhythm, LVH with repolarization abnormality.  Stress EKG is nondiagnostic for ischemia as it's a pharmacologic stress test.  Stress symptoms  included shortness of breath and dizziness. 2. Myocardial perfusion imaging is normal. Overall left ventricular systolic function was normal without regional wall motion abnormalities. The left ventricular ejection fraction was 64%.  This is a low risk study.  Recent labs: Results for Kylie, Torres (MRN [id number]) as of 08/12/2018 15:16  Ref. Range 12/10/2017 13:54  Sodium Latest Ref Range: 134 - 144 mmol/L 144  Potassium Latest Ref Range: 3.5 - 5.2 mmol/L 4.4  Chloride Latest Ref Range: 96 - 106 mmol/L 108 (H)  CO2 Latest Ref Range: 20 - 29 mmol/L 22  Glucose Latest Ref Range: 65 - 99 mg/dL 99  BUN Latest Ref Range: 8 - 27 mg/dL 13  Creatinine Latest Ref Range: 0.57 - 1.00 mg/dL 0.72  Calcium Latest Ref Range: 8.7 - 10.3 mg/dL 9.9  Anion gap Latest Ref Range: 10.0 - 18.0 mmol/L 14.0  BUN/Creatinine Ratio Latest Ref Range: 12 - 28  18  GFR, Est Non African American Latest Ref Range: >59 mL/min/1.73 85  GFR, Est African American Latest Ref Range: >59 mL/min/1.73 98    Review of Systems  Constitution: Negative for decreased appetite, malaise/fatigue, weight gain and weight loss.  HENT: Negative for congestion.   Eyes: Negative for visual disturbance.  Cardiovascular: Negative for  chest pain, dyspnea on exertion, leg swelling, palpitations and syncope.  Respiratory: Negative for cough.   Endocrine: Negative for cold intolerance.  Hematologic/Lymphatic: Does not bruise/bleed easily.  Skin: Negative for itching and rash.  Musculoskeletal: Negative for myalgias.  Gastrointestinal: Negative for abdominal pain, nausea and vomiting.  Genitourinary: Negative for dysuria.  Neurological: Negative for dizziness and weakness.  Psychiatric/Behavioral: The patient is not nervous/anxious.   All other systems reviewed and are negative.      Vitals:   07/22/18 1318  BP: 140/70    Body mass index is 23.74 kg/m. Filed Weights   08/10/18 1309  Weight: 134 lb (60.8 kg)    Objective:    Physical Exam Not performed. Telephone visit.     Assessment & Recommendations:   73 year old African-American female with hypertension, tobacco abuse, history of alcohol abuse, LVH suspicious for hypertrophic myopathy, stable claudication, here for follow-up.  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 Although, hypertrophic cardiomyopathy/amylpidosis cannot be excluded. Patient does not want to get a cardiac MRI at this time. Fortunately, her submaximal exercise stress test did not show any arrhtymias, thus relatively low risk for sudden death and does not warrant ICD for primary prevention.  Hypertension:  Limited data available. BP readings two weeks ago was fairly well controlled. Encourage home monitoring. Continue metoprolol succinate 25 mg daily.  F/u in 3 months.   Nigel Mormon, MD [facility] Pager: [phone] Office: [phone] If no answer Cell [phone]

## 2018-08-12 ENCOUNTER — Encounter: Payer: Self-pay | Admitting: Cardiology

## 2018-08-16 NOTE — Progress Notes (Signed)
[facility] Note  08/17/2018     CHIEF COMPLAINT Patient presents for Retina Follow Up   HISTORY OF PRESENT ILLNESS: Kylie Torres is a 73 y.o. female who presents to the clinic today for:   HPI    Retina Follow Up    Patient presents with  CRVO/BRVO.  In right eye.  This started 3.  Severity is mild.  Since onset it is stable.  I, the attending physician,  performed the HPI with the patient and updated documentation appropriately.          Comments    F/u BRVO OD. Patient states her vision is "good", denies flashes, floaters and ocular pain.       Last edited by Bernarda Caffey, MD on 08/17/2018  2:10 PM. (History)    Pt states no recent changes. Pt still using PF once daily OS (was using 4 times daily initially after laser.   Referring physician: Welford Roche, MD [place],  [place] [address]  HISTORICAL INFORMATION:   Selected notes from the MEDICAL RECORD NUMBER Referred by Dr. Gwendalyn Ege for concern of retinal hemorrhage OU LEE: 05.10.19 Mellody Memos) [BCVA: OD: 20/30-1 OS: 20/25] Ocular Hx-Glaucoma OU, Pseudophakia OU PMH-HTN, current smoker    CURRENT MEDICATIONS: Current Outpatient Medications (Ophthalmic Drugs)  Medication Sig  . brimonidine (ALPHAGAN) 0.2 % ophthalmic solution Place 1 drop into both eyes 2 (two) times a day.   . latanoprost (XALATAN) 0.005 % ophthalmic solution 1 drop as needed.   No current facility-administered medications for this visit.  (Ophthalmic Drugs)   Current Outpatient Medications (Other)  Medication Sig  . metoprolol succinate (TOPROL-XL) 25 MG 24 hr tablet Take 1 tablet (25 mg total) by mouth daily.   No current facility-administered medications for this visit.  (Other)      REVIEW OF SYSTEMS: ROS    Positive for: Eyes   Negative for: Constitutional, Gastrointestinal, Neurological, Skin, Genitourinary, Musculoskeletal, HENT, Endocrine, Cardiovascular, Respiratory,  Psychiatric, Allergic/Imm, Heme/Lymph   Last edited by Zenovia Jordan, LPN on 7/37/1062  6:94 PM. (History)       ALLERGIES No Known Allergies  PAST MEDICAL HISTORY Past Medical History:  Diagnosis Date  . Alcohol abuse   . Blood transfusion without reported diagnosis   . Cirrhosis ([place])   . Gastric ulcer   . Hypertension    Past Surgical History:  Procedure Laterality Date  . ABDOMINAL HYSTERECTOMY    . BREAST EXCISIONAL BIOPSY Left 1971   benign  . CATARACT EXTRACTION Bilateral    Dr. Katy Fitch  . ESOPHAGOGASTRODUODENOSCOPY (EGD) WITH PROPOFOL N/A 04/17/2015   Procedure: ESOPHAGOGASTRODUODENOSCOPY (EGD) WITH PROPOFOL;  Surgeon: Ladene Artist, MD;  Location: [facility];  Service: Endoscopy;  Laterality: N/A;  . EYE SURGERY    . OOPHORECTOMY      FAMILY HISTORY Family History  Problem Relation Age of Onset  . Breast cancer Mother 64  . Heart attack Father   . Hypertension Father   . Hypertension Sister   . Diabetes Sister   . Breast cancer Sister 19  . Breast cancer Maternal Aunt        ? age of onset    SOCIAL HISTORY Social History   Tobacco Use  . Smoking status: Current Some Day Smoker    Packs/day: 0.25    Years: 0.03    Pack years: 0.00    Types: Cigarettes  . Smokeless tobacco: Never Used  . Tobacco comment:  1  pack every 3 days   Substance Use Topics  . Alcohol use: No    Comment: quit drinking in 03/2015 - previously 1/5 gin every 2 days  . Drug use: No         OPHTHALMIC EXAM:  Base Eye Exam    Visual Acuity (Snellen - Linear)      Right Left   Dist cc 20/30 +2 20/20 -2   Dist ph cc NI 20/20 -1       Tonometry (Tonopen, 1:44 PM)      Right Left   Pressure 15 17       Pupils      Dark Light Shape React APD   Right 3 2 Round Brisk None   Left 3 2 Round Brisk None       Visual Fields (Counting fingers)      Left Right    Full Full       Extraocular Movement      Right Left    Full, Ortho Full, Ortho       Neuro/Psych     Oriented x3: Yes   Mood/Affect: Normal       Dilation    Both eyes: 1.0% Mydriacyl, 2.5% Phenylephrine @ 1:41 PM        Slit Lamp and Fundus Exam    Slit Lamp Exam      Right Left   Lids/Lashes Dermatochalasis - upper lid, mild Ptosis Dermatochalasis - upper lid, mild Ptosis   Conjunctiva/Sclera Nasal and Temporal Pinguecula, Melanosis Nasal and Temporal Pinguecula, Melanosis   Cornea Arcus, Well healed cataract wounds, mild EBMD Arcus, Well healed cataract wounds, 1+ Punctate epithelial erosions, mild EBMD   Anterior Chamber Deep and quiet Deep and quiet   Iris Round and dilated Round and dilated   Lens PC IOL in good position PC IOL in good position   Vitreous Vitreous syneresis, PVD Vitreous syneresis       Fundus Exam      Right Left   Disc trace pallor, sharp rim trace pallor, sharp rim   C/D Ratio 0.4 0.4   Macula Good foveal reflex, mild Retinal pigment epithelial mottling, No heme or edema Flat, Good foveal reflex, mild Retinal pigment epithelial mottling   Vessels Vascular attenuation, Tortuous, sclerotic superior arteriole with +sheathing Vascular attenuation, mild Copper wiring, mild AV crossing changes   Periphery Attached, cluster of blot hemes at 1200 midzone-improved; good sectoral PRP laser surrounding sclerotic vessels @ 1200 Attached, No heme           IMAGING AND PROCEDURES  Imaging and Procedures for @TODAY @  OCT, Retina - OU - Both Eyes       Right Eye Quality was good. Central Foveal Thickness: 262. Progression has been stable. Findings include normal foveal contour, no IRF, no SRF (Interval improvement in vitreous opacities).   Left Eye Quality was good. Central Foveal Thickness: 256. Progression has been stable. Findings include normal foveal contour, no IRF, no SRF, vitreomacular adhesion .   Notes *Images captured and stored on drive  Diagnosis / Impression:  NFP, No IRF/SRF OU VMA OS  Clinical management:  See  below  Abbreviations: NFP - Normal foveal profile. CME - cystoid macular edema. PED - pigment epithelial detachment. IRF - intraretinal fluid. SRF - subretinal fluid. EZ - ellipsoid zone. ERM - epiretinal membrane. ORA - outer retinal atrophy. ORT - outer retinal tubulation. SRHM - subretinal hyper-reflective material  ASSESSMENT/PLAN:    ICD-10-CM   1. 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 Retinal edema  H35.81 OCT, Retina - OU - Both Eyes  5. Essential hypertension  I10   6. Hypertensive retinopathy of both eyes  H35.033   7. Pseudophakia of both eyes  Z96.1     1,2. BRVO OD w/ retinal hemes OD  - Focal area of sclerotic / sheathed vessels superiorly OD with nonperfusion  - likely related to cardiovascular history, but differential includes inflammatory, autoimmune, infectious  - s/p PRP to areas of non-perfusion (06.24.20) -- good laser changes in place  - cluster of blot hemes within area of nonperfusion-improved  - using PF once daily, initially qid after laser, advised pt to stop PF  - f/u 8 weeks  3. Ocular ischemic syndrome OU with history of preretinal hemorrhages OS  - scattered peripheral blot hemes resolved today on exam  - repeat FA today 06.24.20 shows delayed filling times OS consistent with history of stenotic carotid arteries  - pt with significant cardiovascular history  - discussed findings, prognosis and treatment options with pt  - carotid ultrasound at [facility] (07.17.19): "stenosis in left ICA (16-49%), no significant plaque, left carotid geometry is tortuous"   - recommend optimization of cardiovascular risk factors with PCP and cardiology teams  4. No retinal edema on exam or OCT  5,6. Hypertensive retinopathy OU  - discussed importance of tight BP control  - contributing to pathology in  #1 and #2  - monitor  7. Pseudophakia OU  - s/p CE/IOL  - doing well  - monitor   Ophthalmic Meds Ordered this visit:  No orders of the defined types were placed in this encounter.      Return in about 8 weeks (around 10/12/2018) for Dilated Exam, OCT, Fluorescein Angiogram.  There are no Patient Instructions on file for this visit.   Explained the diagnoses, plan, and follow up with the patient and they expressed understanding.  Patient expressed understanding of the importance of proper follow up care.   This document serves as a record of services personally performed by Gardiner Sleeper, MD, PhD. It was created on their behalf by Ernest Mallick, OA, an ophthalmic assistant. The creation of this record is the provider's dictation and/or activities during the visit.    Electronically signed by: Ernest Mallick, OA  07.21.2020 2:42 PM     Gardiner Sleeper, M.D., Ph.D. Diseases & Surgery of the Retina and Vitreous [facility]  I have reviewed the above documentation for accuracy and completeness, and I agree with the above. Gardiner Sleeper, M.D., Ph.D. 08/17/18 2:42 PM     Abbreviations: M myopia (nearsighted); A astigmatism; H hyperopia (farsighted); P presbyopia; Mrx spectacle prescription;  CTL contact lenses; OD right eye; OS left eye; OU both eyes  XT exotropia; ET esotropia; PEK punctate epithelial keratitis; PEE punctate epithelial erosions; DES dry eye syndrome; MGD meibomian gland dysfunction; ATs artificial tears; PFAT's preservative free artificial tears; [place] nuclear sclerotic cataract; PSC posterior subcapsular cataract; ERM epi-retinal membrane; PVD posterior vitreous detachment; RD retinal detachment; DM diabetes mellitus; DR diabetic retinopathy; NPDR non-proliferative diabetic retinopathy; PDR proliferative diabetic retinopathy; CSME clinically significant macular edema; DME diabetic macular edema; dbh dot blot hemorrhages; CWS cotton wool spot;  POAG primary open angle glaucoma; C/D cup-to-disc  ratio; HVF humphrey visual field; GVF goldmann visual field; OCT optical coherence tomography; IOP intraocular pressure; BRVO Branch retinal vein occlusion; CRVO central retinal vein occlusion; CRAO central retinal artery occlusion; BRAO branch retinal artery occlusion; RT retinal tear; SB scleral buckle; PPV pars plana vitrectomy; VH Vitreous hemorrhage; PRP panretinal laser photocoagulation; IVK intravitreal kenalog; VMT vitreomacular traction; MH Macular hole;  NVD neovascularization of the disc; NVE neovascularization elsewhere; AREDS age related eye disease study; ARMD age related macular degeneration; POAG primary open angle glaucoma; EBMD epithelial/anterior basement membrane dystrophy; ACIOL anterior chamber intraocular lens; IOL intraocular lens; PCIOL posterior chamber intraocular lens; Phaco/IOL phacoemulsification with intraocular lens placement; [place] photorefractive keratectomy; LASIK laser assisted in situ keratomileusis; HTN hypertension; DM diabetes mellitus; COPD chronic obstructive pulmonary disease

## 2018-08-17 ENCOUNTER — Other Ambulatory Visit: Payer: Self-pay

## 2018-08-17 ENCOUNTER — Encounter (INDEPENDENT_AMBULATORY_CARE_PROVIDER_SITE_OTHER): Payer: Self-pay | Admitting: Ophthalmology

## 2018-08-17 ENCOUNTER — Ambulatory Visit (INDEPENDENT_AMBULATORY_CARE_PROVIDER_SITE_OTHER): Payer: Medicare Other | Admitting: Ophthalmology

## 2018-08-17 DIAGNOSIS — H3581 Retinal edema: Secondary | ICD-10-CM | POA: Diagnosis not present

## 2018-08-17 DIAGNOSIS — H3582 Retinal ischemia: Secondary | ICD-10-CM | POA: Diagnosis not present

## 2018-08-17 DIAGNOSIS — H348311 Tributary (branch) retinal vein occlusion, right eye, with retinal neovascularization: Secondary | ICD-10-CM | POA: Diagnosis not present

## 2018-08-17 DIAGNOSIS — I10 Essential (primary) hypertension: Secondary | ICD-10-CM | POA: Diagnosis not present

## 2018-08-17 DIAGNOSIS — Z961 Presence of intraocular lens: Secondary | ICD-10-CM

## 2018-08-17 DIAGNOSIS — H35033 Hypertensive retinopathy, bilateral: Secondary | ICD-10-CM

## 2018-08-17 DIAGNOSIS — H3561 Retinal hemorrhage, right eye: Secondary | ICD-10-CM | POA: Diagnosis not present

## 2018-08-26 ENCOUNTER — Other Ambulatory Visit: Payer: Self-pay | Admitting: *Deleted

## 2018-08-26 MED ORDER — METOPROLOL SUCCINATE ER 25 MG PO TB24: 25.0000 mg | ORAL_TABLET | Freq: Every day | ORAL | 0 refills | Status: DC

## 2018-09-19 ENCOUNTER — Encounter: Payer: Self-pay | Admitting: Internal Medicine

## 2018-09-19 ENCOUNTER — Ambulatory Visit (INDEPENDENT_AMBULATORY_CARE_PROVIDER_SITE_OTHER): Payer: Medicare Other | Admitting: Internal Medicine

## 2018-09-19 ENCOUNTER — Other Ambulatory Visit: Payer: Self-pay

## 2018-09-19 VITALS — BP 173/68 | HR 54 | Temp 98.2°F | Ht 62.5 in | Wt 131.4 lb

## 2018-09-19 DIAGNOSIS — G4700 Insomnia, unspecified: Secondary | ICD-10-CM | POA: Diagnosis not present

## 2018-09-19 DIAGNOSIS — Z789 Other specified health status: Secondary | ICD-10-CM

## 2018-09-19 DIAGNOSIS — H348311 Tributary (branch) retinal vein occlusion, right eye, with retinal neovascularization: Secondary | ICD-10-CM | POA: Diagnosis not present

## 2018-09-19 DIAGNOSIS — Z79899 Other long term (current) drug therapy: Secondary | ICD-10-CM

## 2018-09-19 DIAGNOSIS — Z7289 Other problems related to lifestyle: Secondary | ICD-10-CM

## 2018-09-19 DIAGNOSIS — I10 Essential (primary) hypertension: Secondary | ICD-10-CM

## 2018-09-19 MED ORDER — DILTIAZEM HCL ER COATED BEADS 120 MG PO CP24: 120.0000 mg | ORAL_CAPSULE | Freq: Every day | ORAL | 1 refills | Status: DC

## 2018-09-19 NOTE — Patient Instructions (Signed)
Ms. Koeller,   Please STOP taking metoprolol for your blood pressure. We changed this medication to Cardiazem.  You will take 1 tablet every day.  Please come back to see me in 4 weeks to recheck your blood pressure and see if we need to change or add a different medication to control your blood pressure.  For your insomnia, start taking melatonin 6 mg every day.  You can increase this to 10 mg every day after 1 week if the 6 mg dose does not work for you.  We can talk about this at your next visit as well.  I will see you in 4 weeks.  Please call us if you have any questions or concerns in the meantime.  -Dr. Frederico Hamman

## 2018-09-20 ENCOUNTER — Encounter: Payer: Self-pay | Admitting: Internal Medicine

## 2018-09-20 ENCOUNTER — Telehealth: Payer: Self-pay | Admitting: *Deleted

## 2018-09-20 DIAGNOSIS — H348311 Tributary (branch) retinal vein occlusion, right eye, with retinal neovascularization: Secondary | ICD-10-CM | POA: Insufficient documentation

## 2018-09-20 NOTE — Telephone Encounter (Signed)
Sent fax back to [facility] with this information. Hubbard Hartshorn, RN, BSN

## 2018-09-20 NOTE — Addendum Note (Signed)
Addended by: Jodean Lima on: 09/20/2018 09:05 AM   Modules accepted: Level of Service

## 2018-09-20 NOTE — Assessment & Plan Note (Signed)
Reports continued abstinence with no relapses.

## 2018-09-20 NOTE — Progress Notes (Signed)
Internal Medicine Clinic Attending  Case discussed with Dr. Santos-Sanchez at the time of the visit.  We reviewed the resident's history and exam and pertinent patient test results.  I agree with the assessment, diagnosis, and plan of care documented in the resident's note.    

## 2018-09-20 NOTE — Telephone Encounter (Signed)
Thank you :)

## 2018-09-20 NOTE — Telephone Encounter (Signed)
30 day supply of diltiazem sent yesterday. Patient requesting 90 day supply. Hubbard Hartshorn, RN, BSN

## 2018-09-20 NOTE — Assessment & Plan Note (Addendum)
Patient is compliant with metoprolol XL 25 mg QD. Unfortunately, she has been experiencing fatigue and lethargy since starting this medication which is likely a side effect of the beta blocker. In addition, her BP remains uncontrolled, 173/68. Her cardiologist is ok switching to a CCB today. Started Cardizem CD 120 mg QD, will attempt to uptitrate as able. If unable to titrate up, will plan to start ACEi or an ARB. Need to avoid diuretics to prevent LVOT obstruction due to history of obstructive cardiomyopathy. Asked patient to follow up in 4 weeks.

## 2018-09-20 NOTE — Progress Notes (Signed)
   CC: Follow up of HTN   HPI:  [name] is a 73 y.o. year-old female with PMH listed below who presents to clinic for follow up HTN. Please see problem based assessment and plan for further details.   Past Medical History:  Diagnosis Date  . Alcohol abuse   . Blood transfusion without reported diagnosis   . Cirrhosis ([place])   . Gastric ulcer   . Hypertension    Review of Systems:   Review of Systems  Constitutional: Negative for chills, fever and weight loss.  Respiratory: Negative for cough and shortness of breath.   Cardiovascular: Negative for chest pain, palpitations and leg swelling.  Gastrointestinal: Negative for abdominal pain, constipation, diarrhea, nausea and vomiting.  Neurological: Negative for dizziness and headaches.     Physical Exam:  Vitals:   09/19/18 1330  BP: (!) 173/68  Pulse: (!) 54  Temp: 98.2 F (36.8 C)  TempSrc: Oral  SpO2: 100%  Weight: 131 lb 6.4 oz (59.6 kg)  Height: 5' 2.5" (1.588 m)    General: well-appearing elderly female in no acute distress  Cardiac: regular rate and rhythm, nl S1/S2, no murmurs, rubs or gallops, no JVD  Pulm: CTAB, no wheezes or crackles, no increased work of breathing on room air Ext: warm and well perfused, no peripheral edema    Assessment & Plan:   See Encounters Tab for problem based charting.  Patient discussed with Dr. Philipp Ovens

## 2018-09-20 NOTE — Telephone Encounter (Signed)
May have to change dose and up titrate at follow up visit in 4 weeks which is why I sent 30 day supply only. If I don't change dose at follow up visit will send 90 day supply.

## 2018-09-20 NOTE — Assessment & Plan Note (Signed)
S/p panretinal laser photocoagulation on 07/20/2018. Has been doing well since surgery. Follows up with ophthalmology, Dr. Bernarda Caffey. Has a follow up visit scheduled for next month.

## 2018-10-18 NOTE — Progress Notes (Signed)
[facility] Note  10/19/2018     CHIEF COMPLAINT Patient presents for Retina Follow Up   HISTORY OF PRESENT ILLNESS: Kylie Torres is a 73 y.o. female who presents to the clinic today for:   HPI    Retina Follow Up    Patient presents with  CRVO/BRVO.  In right eye.  Severity is moderate.  Duration of 9 weeks.  Since onset it is stable.  I, the attending physician,  performed the HPI with the patient and updated documentation appropriately.          Comments    Patient states vision about the same OU. Eyes itch frequently.        Last edited by Bernarda Caffey, MD on 10/19/2018  7:59 PM. (History)    Pt states  Referring physician: Gwendalyn Ege, [facility] [place],  [place] [id number]  HISTORICAL INFORMATION:   Selected notes from the MEDICAL RECORD NUMBER Referred by Dr. Gwendalyn Ege for concern of retinal hemorrhage OU LEE: 05.10.19 (MWynetta Emery) [BCVA: OD: 20/30-1 OS: 20/25] Ocular Hx-Glaucoma OU, Pseudophakia OU PMH-HTN, current smoker    CURRENT MEDICATIONS: Current Outpatient Medications (Ophthalmic Drugs)  Medication Sig  . brimonidine (ALPHAGAN) 0.2 % ophthalmic solution Place 1 drop into both eyes 2 (two) times a day.   . latanoprost (XALATAN) 0.005 % ophthalmic solution 1 drop as needed.   No current facility-administered medications for this visit.  (Ophthalmic Drugs)   Current Outpatient Medications (Other)  Medication Sig  . diltiazem (CARDIZEM CD) 120 MG 24 hr capsule Take 1 capsule (120 mg total) by mouth daily.   No current facility-administered medications for this visit.  (Other)      REVIEW OF SYSTEMS: ROS    Positive for: Musculoskeletal, Endocrine, Cardiovascular, Eyes   Negative for: Constitutional, Gastrointestinal, Neurological, Skin, Genitourinary, HENT, Respiratory, Psychiatric, Allergic/Imm, Heme/Lymph   Last edited by Roselee Nova D, COT on 10/19/2018  2:02 PM. (History)        ALLERGIES No Known Allergies  PAST MEDICAL HISTORY Past Medical History:  Diagnosis Date  . Alcohol abuse   . Blood transfusion without reported diagnosis   . Cirrhosis ([place])   . Gastric ulcer   . Hypertension    Past Surgical History:  Procedure Laterality Date  . ABDOMINAL HYSTERECTOMY    . BREAST EXCISIONAL BIOPSY Left 1971   benign  . CATARACT EXTRACTION Bilateral    Dr. Katy Fitch  . ESOPHAGOGASTRODUODENOSCOPY (EGD) WITH PROPOFOL N/A 04/17/2015   Procedure: ESOPHAGOGASTRODUODENOSCOPY (EGD) WITH PROPOFOL;  Surgeon: Ladene Artist, MD;  Location: [facility];  Service: Endoscopy;  Laterality: N/A;  . EYE SURGERY    . OOPHORECTOMY      FAMILY HISTORY Family History  Problem Relation Age of Onset  . Breast cancer Mother 29  . Heart attack Father   . Hypertension Father   . Hypertension Sister   . Diabetes Sister   . Breast cancer Sister 87  . Breast cancer Maternal Aunt        ? age of onset    SOCIAL HISTORY Social History   Tobacco Use  . Smoking status: Current Some Day Smoker    Packs/day: 0.25    Years: 0.03    Pack years: 0.00    Types: Cigarettes  . Smokeless tobacco: Never Used  . Tobacco comment: 1  pack every 3 days   Substance Use Topics  . Alcohol use: No    Comment: quit  drinking in 03/2015 - previously 1/5 gin every 2 days  . Drug use: No         OPHTHALMIC EXAM:  Base Eye Exam    Visual Acuity (Snellen - Linear)      Right Left   Dist [place] 20/25 -2 20/20 -1   Dist ph [place] NI        Tonometry (Tonopen, 2:11 PM)      Right Left   Pressure 19 15       Pupils      Dark Light Shape React APD   Right 3 2 Round Brisk None   Left 3 2 Round Brisk None       Visual Fields (Counting fingers)      Left Right    Full Full       Extraocular Movement      Right Left    Full, Ortho Full, Ortho       Neuro/Psych    Oriented x3: Yes   Mood/Affect: Normal       Dilation    Both eyes: 1.0% Mydriacyl, 2.5% Phenylephrine @ 2:11 PM         Slit Lamp and Fundus Exam    Slit Lamp Exam      Right Left   Lids/Lashes Dermatochalasis - upper lid, mild Ptosis Dermatochalasis - upper lid, mild Ptosis   Conjunctiva/Sclera Nasal and Temporal Pinguecula, Melanosis Nasal and Temporal Pinguecula, Melanosis   Cornea Arcus, Well healed cataract wounds, mild EBMD Arcus, Well healed cataract wounds, 1+ Punctate epithelial erosions, mild EBMD   Anterior Chamber Deep and quiet Deep and quiet   Iris Round and dilated Round and dilated   Lens PC IOL in good position PC IOL in good position   Vitreous Vitreous syneresis, PVD Vitreous syneresis       Fundus Exam      Right Left   Disc 1-2+ pallor, sharp rim trace pallor, sharp rim   C/D Ratio 0.4 0.3   Macula Flat, Good foveal reflex, mild Retinal pigment epithelial mottling, No heme or edema Flat, Good foveal reflex, mild Retinal pigment epithelial mottling, No heme or edema   Vessels Vascular attenuation, Tortuous, sclerotic superior arteriole with +sheathing Vascular attenuation   Periphery Attached, cluster of blot hemes at 1200 midzone-resolved; good sectoral PRP laser surrounding sclerotic vessels @ 1200, no new NV or heme Attached, No heme           IMAGING AND PROCEDURES  Imaging and Procedures for @TODAY @  OCT, Retina - OU - Both Eyes       Right Eye Quality was good. Central Foveal Thickness: 266. Progression has been stable. Findings include normal foveal contour, no IRF, no SRF (Interval improvement in vitreous opacities).   Left Eye Quality was good. Central Foveal Thickness: 257. Progression has been stable. Findings include normal foveal contour, no IRF, no SRF, vitreomacular adhesion .   Notes *Images captured and stored on drive  Diagnosis / Impression:  NFP, No IRF/SRF OU VMA OS  Clinical management:  See below  Abbreviations: NFP - Normal foveal profile. CME - cystoid macular edema. PED - pigment epithelial detachment. IRF - intraretinal fluid. SRF  - subretinal fluid. EZ - ellipsoid zone. ERM - epiretinal membrane. ORA - outer retinal atrophy. ORT - outer retinal tubulation. SRHM - subretinal hyper-reflective material         Fluorescein Angiography Optos (Transit OD)       Right Eye   Progression has improved. Early  phase findings include vascular perfusion defect, delayed filling. Mid/Late phase findings include staining, vascular perfusion defect (Interval regression of NV and leakage superior to disc).   Left Eye   Progression has been stable. Early phase findings include normal observations. Mid/Late phase findings include staining.   Notes Images stored on drive;   Impression: OD: Interval regression of NV and leakage superior to disc OS: normal study                 ASSESSMENT/PLAN:    ICD-10-CM   1. 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 Retinal edema  H35.81 OCT, Retina - OU - Both Eyes  5. Essential hypertension  I10   6. Hypertensive retinopathy of both eyes  H35.033 Fluorescein Angiography Optos (Transit OD)  7. Pseudophakia of both eyes  Z96.1     1,2. BRVO OD w/ retinal hemes OD  - Focal area of sclerotic / sheathed vessels superiorly OD with nonperfusion  - likely related to cardiovascular history  - s/p PRP to areas of non-perfusion (06.24.20) -- good laser changes in place  - cluster of blot hemes within area of nonperfusion--resolved  - repeat FA today shows interval regression of focal NV and leakage superiorly  - f/u 4 months  3. 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 No retinal edema on exam or OCT  5,6. Hypertensive retinopathy OU  - discussed importance of tight BP control  - contributing to pathology in #1 and #2  - monitor  7. Pseudophakia OU  - s/p CE/IOL  - doing well  - monitor   Ophthalmic Meds Ordered this visit:  No orders of the defined types were placed in this encounter.      Return in about 4 months (around 02/18/2019) for f/u BRVO OD, DFE, OCT.  There are no Patient Instructions on file for this visit.   Explained the diagnoses, plan, and follow up with the patient and they expressed understanding.  Patient expressed understanding of the importance of proper follow up care.   This document serves as a record of services personally performed by Gardiner Sleeper, MD, PhD. It was created on their behalf by Roselee Nova, COMT. The creation of this record is the provider's dictation and/or activities during the visit.  Electronically signed by: Roselee Nova, COMT 10/19/18 8:31 PM   Gardiner Sleeper, M.D., Ph.D. Diseases & Surgery of the Retina and Vitreous [facility]   I have reviewed the above documentation for accuracy and completeness, and I agree with the above. Gardiner Sleeper, M.D., Ph.D. 10/19/18 8:31 PM    Abbreviations: M myopia (nearsighted); A astigmatism; H hyperopia (farsighted); P presbyopia; Mrx spectacle prescription;  CTL contact lenses; OD right eye; OS left eye; OU both eyes  XT exotropia; ET esotropia; PEK punctate epithelial keratitis;  PEE punctate epithelial erosions; DES dry eye syndrome; MGD meibomian gland dysfunction; ATs artificial tears; PFAT's preservative free artificial tears; [place]  nuclear sclerotic cataract; PSC posterior subcapsular cataract; ERM epi-retinal membrane; PVD posterior vitreous detachment; RD retinal detachment; DM diabetes mellitus; DR diabetic retinopathy; NPDR non-proliferative diabetic retinopathy; PDR proliferative diabetic retinopathy; CSME clinically significant macular edema; DME diabetic macular edema; dbh dot blot hemorrhages; CWS cotton wool spot; POAG primary open angle glaucoma; C/D cup-to-disc ratio; HVF humphrey visual field; GVF goldmann visual field; OCT optical coherence tomography; IOP intraocular pressure; BRVO Branch retinal vein occlusion; CRVO central retinal vein occlusion; CRAO central retinal artery occlusion; BRAO branch retinal artery occlusion; RT retinal tear; SB scleral buckle; PPV pars plana vitrectomy; VH Vitreous hemorrhage; PRP panretinal laser photocoagulation; IVK intravitreal kenalog; VMT vitreomacular traction; MH Macular hole;  NVD neovascularization of the disc; NVE neovascularization elsewhere; AREDS age related eye disease study; ARMD age related macular degeneration; POAG primary open angle glaucoma; EBMD epithelial/anterior basement membrane dystrophy; ACIOL anterior chamber intraocular lens; IOL intraocular lens; PCIOL posterior chamber intraocular lens; Phaco/IOL phacoemulsification with intraocular lens placement; [place] photorefractive keratectomy; LASIK laser assisted in situ keratomileusis; HTN hypertension; DM diabetes mellitus; COPD chronic obstructive pulmonary disease

## 2018-10-19 ENCOUNTER — Ambulatory Visit (INDEPENDENT_AMBULATORY_CARE_PROVIDER_SITE_OTHER): Payer: Medicare Other | Admitting: Ophthalmology

## 2018-10-19 ENCOUNTER — Other Ambulatory Visit: Payer: Self-pay

## 2018-10-19 ENCOUNTER — Encounter (INDEPENDENT_AMBULATORY_CARE_PROVIDER_SITE_OTHER): Payer: Self-pay | Admitting: Ophthalmology

## 2018-10-19 DIAGNOSIS — Z961 Presence of intraocular lens: Secondary | ICD-10-CM

## 2018-10-19 DIAGNOSIS — H3582 Retinal ischemia: Secondary | ICD-10-CM

## 2018-10-19 DIAGNOSIS — H3561 Retinal hemorrhage, right eye: Secondary | ICD-10-CM

## 2018-10-19 DIAGNOSIS — H3581 Retinal edema: Secondary | ICD-10-CM | POA: Diagnosis not present

## 2018-10-19 DIAGNOSIS — H348311 Tributary (branch) retinal vein occlusion, right eye, with retinal neovascularization: Secondary | ICD-10-CM | POA: Diagnosis not present

## 2018-10-19 DIAGNOSIS — H35033 Hypertensive retinopathy, bilateral: Secondary | ICD-10-CM | POA: Diagnosis not present

## 2018-10-19 DIAGNOSIS — I10 Essential (primary) hypertension: Secondary | ICD-10-CM

## 2018-10-26 ENCOUNTER — Ambulatory Visit (INDEPENDENT_AMBULATORY_CARE_PROVIDER_SITE_OTHER): Payer: Medicare Other | Admitting: Internal Medicine

## 2018-10-26 ENCOUNTER — Other Ambulatory Visit: Payer: Self-pay

## 2018-10-26 ENCOUNTER — Other Ambulatory Visit: Payer: Self-pay | Admitting: Internal Medicine

## 2018-10-26 VITALS — BP 175/76 | HR 73 | Temp 98.6°F | Ht 62.5 in | Wt 130.7 lb

## 2018-10-26 DIAGNOSIS — Z79899 Other long term (current) drug therapy: Secondary | ICD-10-CM

## 2018-10-26 DIAGNOSIS — I10 Essential (primary) hypertension: Secondary | ICD-10-CM

## 2018-10-26 MED ORDER — DILTIAZEM HCL ER COATED BEADS 240 MG PO CP24: 240.0000 mg | ORAL_CAPSULE | Freq: Every day | ORAL | 1 refills | Status: DC

## 2018-10-26 NOTE — Patient Instructions (Signed)
Kylie Torres,  It was nice meeting you today! Your blood pressure today is still elevated so we are going to increase your diltiazem to 240mg  daily. You can pick up the prescription that is ready for you and take 2 of the 120mg  pills until you are able to pick-up the new prescription. Please try to remember to take your blood pressure at home. You can follow-up in clinic in 4 weeks for another blood pressure check.

## 2018-10-26 NOTE — Assessment & Plan Note (Signed)
Pt presents today for HTN follow-up. Endorses taking diltiazem 120mg  daily. Denies any medication side effects, chest pain, shortness of breath, leg swelling, dizziness, headaches, or nausea. States her fatigue and lethargy have improved since switching off the metoprolol. Her BP today continues to be uncontrolled at 175/76. Pt states she has a blood pressure cuff at home, but has not used it.  Assessment - Essential HTN, uncontrolled  Plan - increase diltiazem to 240mg  daily - encouraged pt to check BP at home  - follow-up for recheck in 4 weeks, it was discussed with the pt that she may need an additional agent (ACEi or ARB)  BP Readings from Last 3 Encounters:  10/26/18 (!) 175/76  09/19/18 (!) 173/68  07/22/18 140/70

## 2018-10-26 NOTE — Progress Notes (Signed)
   CC: blood pressure follow-up   HPI:  [name] is a 73 y.o. F with significant PMH as outlined below who presents today for a blood pressure recheck. Please see problem-based charting for additional information.   Past Medical History:  Diagnosis Date  . Alcohol abuse   . Blood transfusion without reported diagnosis   . Cirrhosis ([place])   . Gastric ulcer   . Hypertension    Review of Systems:   Review of Systems  Constitutional: Negative for chills and fever.  Eyes: Negative for blurred vision and double vision.  Respiratory: Negative for shortness of breath.   Cardiovascular: Negative for chest pain.  Gastrointestinal: Negative for nausea and vomiting.  Neurological: Negative for dizziness and headaches.   Physical Exam:  Vitals:   10/26/18 1316  BP: (!) 175/76  Pulse: 73  Temp: 98.6 F (37 C)  TempSrc: Oral  SpO2: 99%  Weight: 130 lb 11.2 oz (59.3 kg)  Height: 5' 2.5" (1.588 m)   Physical Exam Constitutional:      General: She is not in acute distress.    Appearance: Normal appearance. She is normal weight.  Cardiovascular:     Rate and Rhythm: Normal rate and regular rhythm.     Heart sounds: Normal heart sounds.  Pulmonary:     Effort: Pulmonary effort is normal.     Breath sounds: Normal breath sounds. No wheezing, rhonchi or rales.  Abdominal:     General: Abdomen is flat.  Neurological:     Mental Status: She is alert.    Assessment & Plan:   See Encounters Tab for problem based charting.  Patient seen with Dr. Lynnae January

## 2018-10-29 NOTE — Progress Notes (Signed)
Internal Medicine Clinic Attending  I saw and evaluated the patient.  I personally confirmed the key portions of the history and exam documented by Dr. Jones and I reviewed pertinent patient test results.  The assessment, diagnosis, and plan were formulated together and I agree with the documentation in the resident's note.     

## 2018-11-09 ENCOUNTER — Ambulatory Visit: Payer: Medicare Other | Admitting: Cardiology

## 2018-11-24 ENCOUNTER — Ambulatory Visit: Payer: Medicare Other | Admitting: Cardiology

## 2018-11-28 ENCOUNTER — Other Ambulatory Visit: Payer: Self-pay

## 2018-11-28 ENCOUNTER — Ambulatory Visit (INDEPENDENT_AMBULATORY_CARE_PROVIDER_SITE_OTHER): Payer: Medicare Other | Admitting: Internal Medicine

## 2018-11-28 ENCOUNTER — Encounter: Payer: Self-pay | Admitting: Internal Medicine

## 2018-11-28 DIAGNOSIS — I10 Essential (primary) hypertension: Secondary | ICD-10-CM | POA: Diagnosis not present

## 2018-11-28 DIAGNOSIS — Z79899 Other long term (current) drug therapy: Secondary | ICD-10-CM | POA: Diagnosis not present

## 2018-11-28 NOTE — Progress Notes (Signed)
Internal Medicine Clinic Attending  Case discussed with Dr. Santos-Sanchez at the time of the visit.  We reviewed the resident's history and exam and pertinent patient test results.  I agree with the assessment, diagnosis, and plan of care documented in the resident's note.    

## 2018-11-28 NOTE — Assessment & Plan Note (Signed)
Called patient at 1:03PM for telehealth appointment scheduled for hypertension follow-up.  She was last seen in 09/2018 at which time she remained hypertensive with systolic BP in the [id number] and her diltiazem was increased from 120 -> 240 mg daily. She has been checking her blood pressure on a  weekly basis, but not daily.  She last checked it this morning and it was 142/74 which is greatly improved.  She does not recall previous BP readings.  Even though BP is not at goal, I will not adjust her antihypertensive medications at this time as she was only able to provide 1 BP measurement. Will continue Cardizem 240 mg daily.  I asked her to follow-up with me in person in 3 months.

## 2018-11-28 NOTE — Progress Notes (Signed)
  [facility] Telephone Encounter Continuity Care Appointment  HPI:   This telephone encounter was created for Ms. Oneka Vegh on 11/28/2018 for the following purpose/cc HTN follow up.   Past Medical History:  Past Medical History:  Diagnosis Date  . Alcohol abuse   . Blood transfusion without reported diagnosis   . Cirrhosis ([place])   . Gastric ulcer   . Hypertension       ROS:   Denies HA, chest pain, palpitations, shortness of breath, and leg swelling.    Assessment / Plan / Recommendations:   Please see A&P under problem oriented charting for assessment of the patient's acute and chronic medical conditions.   As always, pt is advised that if symptoms worsen or new symptoms arise, they should go to an urgent care facility or to to ER for further evaluation.   Consent and Medical Decision Making:   Patient discussed with Dr. Philipp Ovens  This is a telephone encounter between Luisa Hart and Welford Roche on 11/28/2018 for HTN follow up. The visit was conducted with the patient located at home and Welford Roche at [facility]. The patient's identity was confirmed using their DOB and current address. The patient has consented to being evaluated through a telephone encounter and understands the associated risks (an examination cannot be done and the patient may need to come in for an appointment) / benefits (allows the patient to remain at home, decreasing exposure to coronavirus). I personally spent 6 minutes on medical discussion.

## 2018-11-29 ENCOUNTER — Other Ambulatory Visit: Payer: Self-pay | Admitting: Internal Medicine

## 2019-02-20 ENCOUNTER — Encounter (INDEPENDENT_AMBULATORY_CARE_PROVIDER_SITE_OTHER): Payer: Medicare Other | Admitting: Ophthalmology

## 2019-02-23 NOTE — Progress Notes (Signed)
[facility] Note  03/01/2019     CHIEF COMPLAINT Patient presents for Retina Follow Up   HISTORY OF PRESENT ILLNESS: Kylie Torres is a 74 y.o. female who presents to the clinic today for:   HPI    Retina Follow Up    Patient presents with  CRVO/BRVO.  In right eye.  This started 7 months ago.  Severity is mild.  Duration of 4 months.  Since onset it is stable.  I, the attending physician,  performed the HPI with the patient and updated documentation appropriately.          Comments    74 y/o female pt here for 4 mo f/u for BRVO OD.  No change in [place] OU.  Denies flashes, floaters, but reports occasional pain OD with no obvious cause.  OD has been painful (pain level 5) for about 2 days.  AT prn OU.       Last edited by Bernarda Caffey, MD on 03/01/2019  3:09 PM. (History)    Pt states her right eye hurts occasionally, she states she has a bump on her left eye that does not hurt  Referring physician: Welford Roche, MD [place],   [id number]  HISTORICAL INFORMATION:   Selected notes from the MEDICAL RECORD NUMBER Referred by Dr. Gwendalyn Ege for concern of retinal hemorrhage OU LEE: 05.10.19 Mellody Memos) [BCVA: OD: 20/30-1 OS: 20/25] Ocular Hx-Glaucoma OU, Pseudophakia OU PMH-HTN, current smoker    CURRENT MEDICATIONS: Current Outpatient Medications (Ophthalmic Drugs)  Medication Sig  . brimonidine (ALPHAGAN) 0.2 % ophthalmic solution Place 1 drop into both eyes 2 (two) times a day.   . latanoprost (XALATAN) 0.005 % ophthalmic solution 1 drop as needed.   No current facility-administered medications for this visit. (Ophthalmic Drugs)   Current Outpatient Medications (Other)  Medication Sig  . diltiazem (CARDIZEM CD) 240 MG 24 hr capsule TAKE 1 CAPSULE(240 MG) BY MOUTH DAILY   No current facility-administered medications for this visit. (Other)      REVIEW OF SYSTEMS: ROS    Positive for: Cardiovascular, Eyes    Negative for: Constitutional, Gastrointestinal, Neurological, Skin, Genitourinary, Musculoskeletal, HENT, Endocrine, Respiratory, Psychiatric, Allergic/Imm, Heme/Lymph   Last edited by Matthew Folks, COA on 03/01/2019  2:31 PM. (History)       ALLERGIES No Known Allergies  PAST MEDICAL HISTORY Past Medical History:  Diagnosis Date  . Alcohol abuse   . Blood transfusion without reported diagnosis   . Cirrhosis ([place])   . Gastric ulcer   . Hypertension   . Hypertensive retinopathy    OU   Past Surgical History:  Procedure Laterality Date  . ABDOMINAL HYSTERECTOMY    . BREAST EXCISIONAL BIOPSY Left 1971   benign  . CATARACT EXTRACTION Bilateral    Dr. Katy Fitch  . ESOPHAGOGASTRODUODENOSCOPY (EGD) WITH PROPOFOL N/A 04/17/2015   Procedure: ESOPHAGOGASTRODUODENOSCOPY (EGD) WITH PROPOFOL;  Surgeon: Ladene Artist, MD;  Location: [facility];  Service: Endoscopy;  Laterality: N/A;  . EYE SURGERY Bilateral    Cat Sx  . OOPHORECTOMY      FAMILY HISTORY Family History  Problem Relation Age of Onset  . Breast cancer Mother 59  . Heart attack Father   . Hypertension Father   . Hypertension Sister   . Diabetes Sister   . Breast cancer Sister 63  . Breast cancer Maternal Aunt        ? age of onset  SOCIAL HISTORY Social History   Tobacco Use  . Smoking status: Current Some Day Smoker    Packs/day: 0.25    Years: 0.03    Pack years: 0.00    Types: Cigarettes  . Smokeless tobacco: Never Used  . Tobacco comment: 1  pack every 3 days   Substance Use Topics  . Alcohol use: No    Comment: quit drinking in 03/2015 - previously 1/5 gin every 2 days  . Drug use: No         OPHTHALMIC EXAM:  Base Eye Exam    Visual Acuity (Snellen - Linear)      Right Left   Dist [place] 20/25 -2 20/20 -   Dist ph [place] 20/25        Tonometry (Tonopen, 2:34 PM)      Right Left   Pressure 16 17       Pupils      Dark Light Shape React APD   Right 3 2 Round Brisk None   Left 3 2  Round Brisk None       Visual Fields (Counting fingers)      Left Right    Full Full       Extraocular Movement      Right Left    Full, Ortho Full, Ortho       Neuro/Psych    Oriented x3: Yes   Mood/Affect: Normal       Dilation    Both eyes: 1.0% Mydriacyl, 2.5% Phenylephrine @ 2:34 PM        Slit Lamp and Fundus Exam    Slit Lamp Exam      Right Left   Lids/Lashes Dermatochalasis - upper lid, mild Ptosis Dermatochalasis - upper lid, mild Ptosis, UL Chalazion   Conjunctiva/Sclera Nasal and Temporal Pinguecula, Melanosis Nasal and Temporal Pinguecula, Melanosis   Cornea Arcus, Well healed cataract wounds, trace Punctate epithelial erosions Arcus, Well healed cataract wounds, trace Punctate epithelial erosions   Anterior Chamber Deep and quiet Deep and quiet   Iris Round and dilated Round and dilated   Lens PC IOL in good position PC IOL in good position   Vitreous Vitreous syneresis, PVD Vitreous syneresis       Fundus Exam      Right Left   Disc 1-2+ pallor, sharp rim trace pallor, sharp rim   C/D Ratio 0.5 0.4   Macula Flat, Good foveal reflex, mild Retinal pigment epithelial mottling, No heme or edema Flat, Good foveal reflex, mild Retinal pigment epithelial mottling, No heme or edema   Vessels Vascular attenuation, Tortuous, sclerotic superior arteriole with +sheathing Vascular attenuation   Periphery Attached, good sectoral PRP laser surrounding sclerotic vessels at 1200, no new NV or heme Attached, No heme           IMAGING AND PROCEDURES  Imaging and Procedures for @TODAY @  OCT, Retina - OU - Both Eyes       Right Eye Quality was good. Central Foveal Thickness: 264. Progression has been stable. Findings include normal foveal contour, no IRF, no SRF (Interval improvement in vitreous opacities).   Left Eye Quality was good. Central Foveal Thickness: 256. Progression has been stable. Findings include normal foveal contour, no IRF, no SRF, vitreomacular  adhesion .   Notes *Images captured and stored on drive  Diagnosis / Impression:  NFP, No IRF/SRF OU VMA OS  Clinical management:  See below  Abbreviations: NFP - Normal foveal profile. CME - cystoid macular edema.  PED - pigment epithelial detachment. IRF - intraretinal fluid. SRF - subretinal fluid. EZ - ellipsoid zone. ERM - epiretinal membrane. ORA - outer retinal atrophy. ORT - outer retinal tubulation. SRHM - subretinal hyper-reflective material                  ASSESSMENT/PLAN:    ICD-10-CM   1. 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 Retinal edema  H35.81 OCT, Retina - OU - Both Eyes  5. Essential hypertension  I10   6. Hypertensive retinopathy of both eyes  H35.033   7. Pseudophakia of both eyes  Z96.1     1,2. BRVO OD w/ retinal hemes OD  - Focal area of sclerotic / sheathed vessels superiorly OD with nonperfusion  - likely related to cardiovascular history  - s/p PRP to areas of non-perfusion (06.24.20) -- good laser changes in place  - cluster of blot hemes within area of nonperfusion--resolved  - repeat FA (09.23.20) shows interval regression of focal NV and leakage superiorly  - today stable regression of NV  - f/u 6-9 months  3. Ocular ischemic syndrome OU with history of preretinal hemorrhages OS  - scattered peripheral blot hemes stably resolved today on exam  - repeat FA 06.24.20 shows delayed filling time consistent with history of stenotic carotid arteries  - pt with significant cardiovascular history  - discussed findings, prognosis and treatment options with pt  - carotid [facility] at [facility] (07.17.19): "stenosis in left ICA (16-49%), no significant plaque, left carotid geometry is tortuous"   - recommend continued optimization of cardiovascular risk factors with PCP and cardiology teams  4. No  retinal edema on exam or OCT  5,6. Hypertensive retinopathy OU  - discussed importance of tight BP control  - contributing to pathology in #1 and #2  - monitor  7. Pseudophakia OU  - s/p CE/IOL  - doing well  - monitor   Ophthalmic Meds Ordered this visit:  No orders of the defined types were placed in this encounter.      Return for f/u 6-9 months, BRVO OD, DFE, OCT.  There are no Patient Instructions on file for this visit.   Explained the diagnoses, plan, and follow up with the patient and they expressed understanding.  Patient expressed understanding of the importance of proper follow up care.   This document serves as a record of services personally performed by Gardiner Sleeper, MD, PhD. It was created on their behalf by Roselee Nova, COMT. The creation of this record is the provider's dictation and/or activities during the visit.  Electronically signed by: Roselee Nova, COMT 03/01/19 3:27 PM   This document serves as a record of services personally performed by Gardiner Sleeper, MD, PhD. It was created on their behalf by Ernest Mallick, OA, an ophthalmic assistant. The creation of this record is the provider's dictation and/or activities during the visit.    Electronically signed by: Ernest Mallick, OA 02.03.2021 3:27 PM  Gardiner Sleeper, M.D., Ph.D. Diseases & Surgery of the Retina and [place] 03/01/2019   I have reviewed the above documentation for accuracy and completeness, and I agree with the above. Gardiner Sleeper, M.D., Ph.D. 03/01/19 3:27 PM   Abbreviations: M myopia (nearsighted); A astigmatism; H hyperopia (farsighted); P presbyopia; Mrx spectacle prescription;  CTL  contact lenses; OD right eye; OS left eye; OU both eyes  XT exotropia; ET esotropia; PEK punctate epithelial keratitis; PEE punctate epithelial erosions; DES dry eye syndrome; MGD meibomian gland dysfunction; ATs artificial tears; PFAT's preservative free artificial  tears; [place] nuclear sclerotic cataract; PSC posterior subcapsular cataract; ERM epi-retinal membrane; PVD posterior vitreous detachment; RD retinal detachment; DM diabetes mellitus; DR diabetic retinopathy; NPDR non-proliferative diabetic retinopathy; PDR proliferative diabetic retinopathy; CSME clinically significant macular edema; DME diabetic macular edema; dbh dot blot hemorrhages; CWS cotton wool spot; POAG primary open angle glaucoma; C/D cup-to-disc ratio; HVF humphrey visual field; GVF goldmann visual field; OCT optical coherence tomography; IOP intraocular pressure; BRVO Branch retinal vein occlusion; CRVO central retinal vein occlusion; CRAO central retinal artery occlusion; BRAO branch retinal artery occlusion; RT retinal tear; SB scleral buckle; PPV pars plana vitrectomy; VH Vitreous hemorrhage; PRP panretinal laser photocoagulation; IVK intravitreal kenalog; VMT vitreomacular traction; MH Macular hole;  NVD neovascularization of the disc; NVE neovascularization elsewhere; AREDS age related eye disease study; ARMD age related macular degeneration; POAG primary open angle glaucoma; EBMD epithelial/anterior basement membrane dystrophy; ACIOL anterior chamber intraocular lens; IOL intraocular lens; PCIOL posterior chamber intraocular lens; Phaco/IOL phacoemulsification with intraocular lens placement; [place] photorefractive keratectomy; LASIK laser assisted in situ keratomileusis; HTN hypertension; DM diabetes mellitus; COPD chronic obstructive pulmonary disease

## 2019-03-01 ENCOUNTER — Encounter (INDEPENDENT_AMBULATORY_CARE_PROVIDER_SITE_OTHER): Payer: Self-pay | Admitting: Ophthalmology

## 2019-03-01 ENCOUNTER — Other Ambulatory Visit: Payer: Self-pay

## 2019-03-01 ENCOUNTER — Ambulatory Visit (INDEPENDENT_AMBULATORY_CARE_PROVIDER_SITE_OTHER): Payer: Medicare Other | Admitting: Ophthalmology

## 2019-03-01 DIAGNOSIS — Z961 Presence of intraocular lens: Secondary | ICD-10-CM

## 2019-03-01 DIAGNOSIS — H3582 Retinal ischemia: Secondary | ICD-10-CM

## 2019-03-01 DIAGNOSIS — H348311 Tributary (branch) retinal vein occlusion, right eye, with retinal neovascularization: Secondary | ICD-10-CM

## 2019-03-01 DIAGNOSIS — H35033 Hypertensive retinopathy, bilateral: Secondary | ICD-10-CM

## 2019-03-01 DIAGNOSIS — H3581 Retinal edema: Secondary | ICD-10-CM | POA: Diagnosis not present

## 2019-03-01 DIAGNOSIS — H3561 Retinal hemorrhage, right eye: Secondary | ICD-10-CM | POA: Diagnosis not present

## 2019-03-01 DIAGNOSIS — I10 Essential (primary) hypertension: Secondary | ICD-10-CM | POA: Diagnosis not present

## 2019-04-03 ENCOUNTER — Encounter: Payer: Self-pay | Admitting: *Deleted

## 2019-04-03 NOTE — Progress Notes (Signed)

## 2019-04-05 NOTE — Progress Notes (Signed)
Things That May Be Affecting Your Health:  Alcohol  Hearing loss  Pain    Depression  Home Safety  Sexual Health   Diabetes X Lack of physical activity  Stress   Difficulty with daily activities X Loneliness  Tiredness   Drug use  Medicines X Tobacco use   Falls  Motor Vehicle Safety  Weight   Food choices  Oral Health  Other    YOUR PERSONALIZED HEALTH PLAN : 1. Schedule your next subsequent Medicare Wellness visit in one year 2. Attend all of your regular appointments to address your medical issues 3. Complete the preventative screenings and services   Annual Wellness Visit   Medicare Covered Preventative Screenings and [place] Men and Women Who How Often Need? Date of Last Service Action  Abdominal Aortic Aneurysm Adults with AAA risk factors Once     Alcohol Misuse and Counseling All Adults Screening once a year if no alcohol misuse. Counseling up to 4 face to face sessions.     Bone Density Measurement  Adults at risk for osteoporosis Once every 2 yrs X    Lipid Panel Z13.6 All adults without CV disease Once every 5 yrs     Colorectal Cancer   Stool sample or  Colonoscopy All adults 29 and older   Once every year  Every 10 years     Depression All Adults Once a year  Today   Diabetes Screening Blood glucose, post glucose load, or GTT Z13.1  All adults at risk  Pre-diabetics  Once per year  Twice per year     Diabetes  Self-Management Training All adults Diabetics 10 hrs first year; 2 hours subsequent years. Requires Copay     Glaucoma  Diabetics  Family history of glaucoma  African Americans 11 yrs +  Hispanic Americans 27 yrs + Annually - requires coppay     Hepatitis C Z72.89 or F19.20  High Risk for HCV  Born between 1945 and 1965  Annually  Once     HIV Z11.4 All adults based on risk  Annually btw ages 39 & 70 regardless of risk  Annually > 65 yrs if at increased risk     Lung Cancer Screening Asymptomatic adults aged  39-77 with 30 pack yr history and current smoker OR quit within the last 15 yrs Annually Must have counseling and shared decision making documentation before first screen     Medical Nutrition Therapy Adults with   Diabetes  Renal disease  Kidney transplant within past 3 yrs 3 hours first year; 2 hours subsequent years     Obesity and Counseling All adults Screening once a year Counseling if BMI 30 or higher  Today   Tobacco Use Counseling Adults who use tobacco  Up to 8 visits in one year X    Vaccines Z23  Hepatitis B  Influenza   Pneumonia  Adults   Once  Once every flu season  Two different vaccines separated by one year X (flu)    Next Annual Wellness Visit People with Medicare Every year  Today     Services & Screenings Women Who How Often Need  Date of Last Service Action  Mammogram  Z12.31 Women over 41 One baseline ages 59-39. Annually ager 40 yrs+     Pap tests All women Annually if high risk. Every 2 yrs for normal risk women     Screening for cervical cancer with   Pap (Z01.419 nl or Z01.411abnl) &  HPV Z11.51 Women aged 51 to 43 Once every 5 yrs     Screening pelvic and breast exams All women Annually if high risk. Every 2 yrs for normal risk women     Sexually Transmitted Diseases  Chlamydia  Gonorrhea  Syphilis All at risk adults Annually for non pregnant females at increased risk         [place] Men Who How Ofter Need  Date of Last Service Action  Prostate Cancer - DRE & PSA Men over 50 Annually.  DRE might require a copay.     Sexually Transmitted Diseases  Syphilis All at risk adults Annually for men at increased risk

## 2019-05-18 ENCOUNTER — Other Ambulatory Visit: Payer: Self-pay

## 2019-05-18 ENCOUNTER — Ambulatory Visit (INDEPENDENT_AMBULATORY_CARE_PROVIDER_SITE_OTHER): Payer: Medicare Other | Admitting: Internal Medicine

## 2019-05-18 ENCOUNTER — Encounter: Payer: Self-pay | Admitting: Internal Medicine

## 2019-05-18 VITALS — BP 125/55 | Ht 62.0 in | Wt 131.2 lb

## 2019-05-18 DIAGNOSIS — Z0000 Encounter for general adult medical examination without abnormal findings: Secondary | ICD-10-CM

## 2019-05-18 DIAGNOSIS — E2839 Other primary ovarian failure: Secondary | ICD-10-CM

## 2019-05-18 NOTE — Progress Notes (Signed)
I discussed the AWV findings with the RN who conducted the visit. I was present in the office suite and immediately available to provide assistance and direction throughout the time the service was provided.   

## 2019-05-18 NOTE — Progress Notes (Signed)
This AWV is being conducted by [facility] only. The patient was located at her home and I was located in [facility]. The patient's identity was confirmed using their DOB and current address. The patient or his/her legal guardian has consented to being evaluated through a telephone encounter and understands the associated risks (an examination cannot be done and the patient may need to come in for an appointment) / benefits (allows the patient to remain at home, decreasing exposure to coronavirus). I personally spent 43 minutes conducting the AWV.  Subjective:   Kylie Torres is a 74 y.o. female who presents for a Medicare Annual Wellness Visit.  The following items have been reviewed and updated today in the appropriate area in the EMR.   Health Risk Assessment  Height, weight, BMI, and BP Visual acuity if needed Depression screen Fall risk / safety level Advance directive discussion Medical and family history were reviewed and updated Updating list of other providers & suppliers Medication reconciliation, including over the counter medicines Cognitive screen Written screening schedule Risk Factor list Personalized health advice, risky behaviors, and treatment advice  Social History   Social History Narrative   Current Social History 05/18/2019        Patient lives alone in an apartment which is 2 stories. There are steps with hand rails which the patient uses.       Patient's method of transportation is via her daughter.      The highest level of education was high school diploma.      The patient currently retired.      Identified important Relationships are with her daughter, a good friend who calls her daily, and a neighbor.      Pets : No       Interests / Fun: Watching TV and reading about various subjects       Current Stressors: None       Religious / Personal Beliefs: Holliness       Other: Will I get to see Dr. Frederico Hamman again before she leaves?  Pt informed  she has an upcoming appointment with Dr. Frederico Hamman on 07/03/2019 @ 1:15 and this would be included in her AVS.   SChaplin, RN,BSN             Objective:    Vitals: BP (!) 125/55 (BP Location: Right Arm)   Ht 5\' 2"  (1.575 m)   Wt 131 lb 3.2 oz (59.5 kg)   BMI 24.00 kg/m  Vitals are patient reported  Activities of Daily Living In your present state of health, do you have any difficulty performing the following activities: 05/18/2019 10/26/2018  Hearing? N N  Vision? N N  Comment reading -  Difficulty concentrating or making decisions? N N  Walking or climbing stairs? N N  Dressing or bathing? N N  Doing errands, shopping? N N  Comment Daughter takes her places, she does not drive -  Some recent data might be hidden    Goals Goals    . Exercise 2x per week     Will begin seating and standing exercises with exercise band.    . Quit Smoking (pt-stated)     Patient reports she quit smoking 05/04/2019, information provided on [place] Quitline.       Fall Risk Fall Risk  05/18/2019 10/26/2018 09/19/2018 03/11/2018 12/10/2017  Falls in the past year? 0 0 0 0 0  Number falls in past yr: - - - 0 -  Injury with  Fall? - - - 0 -  Risk for fall due to : No Fall Risks - - - -  Follow up Education provided;Falls prevention discussed - - - Falls evaluation completed   CDC Handout on Fall Prevention and Handout on Home Exercise Program, Access codes [id number] and [id number] given/mailed to patient with exercise band.   Depression Screen PHQ 2/9 Scores 05/18/2019 10/26/2018 09/19/2018 03/11/2018  PHQ - 2 Score 4 0 2 1  PHQ- 9 Score 7 1 7 4    Offered referral to [facility], patient declined.    Cognitive Testing Six-Item Cognitive Screener   "I would like to ask you some questions that ask you to use your memory. I am going to name three objects. Please wait until I say all three words, then repeat them. Remember what they are  because I am going to ask you to name them again in a few minutes. Please repeat  these words for me: APPLE--TABLE--PENNY." (Interviewer may repeat names 3 times if necessary but repetition not scored.)  Did patient correctly repeat all three words? Yes - may proceed with screen  What year is this? Correct What month is this? Correct What day of the week is this? Correct  What were the three objects I asked you to remember? . Apple Correct . Table Correct . Penny Correct  Score one point for each incorrect answer.  A score of 2 or more points warrants additional investigation.  Patient's score 0     Assessment and Plan:    Referral placed to [facility] for Dexa scan Offered referral to [facility] for PHQ of 7 and patient declined.   Pt quit smoking on 05/04/19, information given on [place] Quit-line.   Pt encouraged to get a Covid vaccine and phone number provided for scheduling.   During the course of the visit the patient was educated and counseled about appropriate screening and preventive services as documented in the assessment and plan.  The printed AVS was given to the patient and included an updated screening schedule, a list of risk factors, and personalized health advice.        Higinio Roger, RN  05/18/2019

## 2019-05-18 NOTE — Patient Instructions (Addendum)
Annual Wellness Visit   Medicare Covered Preventative Screenings and Services  Services & Screenings Men and Women Who How Often Need? Date of Last Service Action  Abdominal Aortic Aneurysm Adults with AAA risk factors Once     Alcohol Misuse and Counseling All Adults Screening once a year if no alcohol misuse. Counseling up to 4 face to face sessions.     Bone Density Measurement  Adults at risk for osteoporosis Once every 2 yrs    Order sent to [facility], please call them at [phone] to schedule appointment.  Lipid Panel Z13.6 All adults without CV disease Once every 5 yrs     Colorectal Cancer   Stool sample or  Colonoscopy All adults 57 and older   Once every year  Every 10 years     Depression All Adults Once a year  Today  Consider meeting with Miquel Dunn for Behavioral Health  Diabetes Screening Blood glucose, post glucose load, or GTT Z13.1  All adults at risk  Pre-diabetics  Once per year  Twice per year     Diabetes  Self-Management Training All adults Diabetics 10 hrs first year; 2 hours subsequent years. Requires Copay     Glaucoma  Diabetics  Family history of glaucoma  African Americans 70 yrs +  Hispanic Americans 79 yrs + Annually - requires coppay     Hepatitis C Z72.89 or F19.20  High Risk for HCV  Born between 1945 and 1965  Annually  Once     HIV Z11.4 All adults based on risk  Annually btw ages 8 & 87 regardless of risk  Annually > 65 yrs if at increased risk     Lung Cancer Screening Asymptomatic adults aged 55-77 with 30 pack yr history and current smoker OR quit within the last 15 yrs Annually Must have counseling and shared decision making documentation before first screen     Medical Nutrition Therapy Adults with   Diabetes  Renal disease  Kidney transplant within past 3 yrs 3 hours first year; 2 hours subsequent years     Obesity and Counseling All adults Screening once a year Counseling if BMI 30 or higher  Today   Tobacco  Use Counseling Adults who use tobacco  Up to 8 visits in one year    Call 1-800-QUIT-NOW for continued help with not smoking  Vaccines Z23  Hepatitis B  Influenza   Pneumonia  Adults   Once  Once every flu season  Two different vaccines separated by one year    Yearly flu vaccine starting September 1st  Get your Covid vaccine.  Please call to schedule appointment.   Next Annual Wellness Visit People with Medicare Every year  Today     [place] Women Who How Often Need  Date of Last Service Action  Mammogram  Z12.31 Women over 70 One baseline ages 70-39. Annually ager 40 yrs+     Pap tests All women Annually if high risk. Every 2 yrs for normal risk women     Screening for cervical cancer with   Pap (Z01.419 nl or Z01.411abnl) &  HPV Z11.51 Women aged 35 to 54 Once every 5 yrs     Screening pelvic and breast exams All women Annually if high risk. Every 2 yrs for normal risk women     Sexually Transmitted Diseases  Chlamydia  Gonorrhea  Syphilis All at risk adults Annually for non pregnant females at increased risk  Services & Screenings Men Who How Ofter Need  Date of Last Service Action  Prostate Cancer - DRE & PSA Men over 50 Annually.  DRE might require a copay.     Sexually Transmitted Diseases  Syphilis All at risk adults Annually for men at increased risk         Things That May Be Affecting Your Health:  Alcohol  Hearing loss  Pain    Depression  Home Safety  Sexual Health   Diabetes X Lack of physical activity  Stress   Difficulty with daily activities X Loneliness  Tiredness   Drug use  Medicines X Tobacco use   Falls  Motor Vehicle Safety  Weight   Food choices  Oral Health  Other    YOUR PERSONALIZED HEALTH PLAN : 1. Schedule your next subsequent Medicare Wellness visit in one year 2. Attend all of your regular appointments to address your medical issues 3. Complete the preventative screenings and services 4. Order  sent to [facility], please call them at [phone] to schedule appointment 5. Consider meeting with Miquel Dunn for [facility]  6. Call 1-800-QUIT-NOW for continued help with not smoking 7. Get your Covid vaccine.  Please call to schedule appointment at the number provided.     Kylie Torres Density Test The bone density test uses a special type of X-ray to measure the amount of calcium and other minerals in your bones. It can measure bone density in the hip and the spine. The test procedure is similar to having a regular X-ray. This test may also be called:  Bone densitometry.  Bone mineral density test.  Dual-energy X-ray absorptiometry (DEXA). You may have this test to:  Diagnose a condition that causes weak or thin bones (osteoporosis).  Screen you for osteoporosis.  Predict your risk for a broken bone (fracture).  Determine how well your osteoporosis treatment is working. Tell a health care provider about:  Any allergies you have.  All medicines you are taking, including vitamins, herbs, eye drops, creams, and over-the-counter medicines.  Any problems you or family members have had with anesthetic medicines.  Any blood disorders you have.  Any surgeries you have had.  Any medical conditions you have.  Whether you are pregnant or may be pregnant.  Any medical tests you have had within the past 14 days that used contrast material. What are the risks? Generally, this is a safe procedure. However, it does expose you to a small amount of radiation, which can slightly increase your cancer risk. What happens before the procedure?  Do not take any calcium supplements starting 24 hours before your test.  Remove all metal jewelry, eyeglasses, dental appliances, and any other metal objects. What happens during the procedure?   You will lie down on an exam table. There will be an X-ray generator below you and an imaging device above you.  Other devices, such as boxes or  braces, may be used to position your body properly for the scan.  The machine will slowly scan your body. You will need to keep still.  The images will show up on a screen in the room. Images will be examined by a specialist after your test is done. The procedure may vary among health care providers and hospitals. What happens after the procedure?  It is up to you to get your test results. Ask your health care provider, or the department that is doing the test, when your results will be ready. Summary  A  bone density test is an imaging test that uses a type of X-ray to measure the amount of calcium and other minerals in your bones.  The test may be used to diagnose or screen you for a condition that causes weak or thin bones (osteoporosis), predict your risk for a broken bone (fracture), or determine how well your osteoporosis treatment is working.  Do not take any calcium supplements starting 24 hours before your test.  Ask your health care provider, or the department that is doing the test, when your results will be ready. This information is not intended to replace advice given to you by your health care provider. Make sure you discuss any questions you have with your health care provider. Document Revised: 01/28/2017 Document Reviewed: 11/16/2016 Elsevier Patient Education  [place] Prevention in the Home, Adult Falls can cause injuries. They can happen to people of all ages. There are many things you can do to make your home safe and to help prevent falls. Ask for help when making these changes, if needed. What actions can I take to prevent falls? General Instructions  Use good lighting in all rooms. Replace any light bulbs that burn out.  Turn on the lights when you go into a dark area. Use night-lights.  Keep items that you use often in easy-to-reach places. Lower the shelves around your home if necessary.  Set up your furniture so you have a clear path.  Avoid moving your furniture around.  Do not have throw rugs and other things on the floor that can make you trip.  Avoid walking on wet floors.  If any of your floors are uneven, fix them.  Add color or contrast paint or tape to clearly mark and help you see: ? Any grab bars or handrails. ? First and last steps of stairways. ? Where the edge of each step is.  If you use a stepladder: ? Make sure that it is fully opened. Do not climb a closed stepladder. ? Make sure that both sides of the stepladder are locked into place. ? Ask someone to hold the stepladder for you while you use it.  If there are any pets around you, be aware of where they are. What can I do in the bathroom?      Keep the floor dry. Clean up any water that spills onto the floor as soon as it happens.  Remove soap buildup in the tub or shower regularly.  Use non-skid mats or decals on the floor of the tub or shower.  Attach bath mats securely with double-sided, non-slip rug tape.  If you need to sit down in the shower, use a plastic, non-slip stool.  Install grab bars by the toilet and in the tub and shower. Do not use towel bars as grab bars. What can I do in the bedroom?  Make sure that you have a light by your bed that is easy to reach.  Do not use any sheets or blankets that are too big for your bed. They should not hang down onto the floor.  Have a firm chair that has side arms. You can use this for support while you get dressed. What can I do in the kitchen?  Clean up any spills right away.  If you need to reach something above you, use a strong step stool that has a grab bar.  Keep electrical cords out of the way.  Do not use floor polish or wax  that makes floors slippery. If you must use wax, use non-skid floor wax. What can I do with my stairs?  Do not leave any items on the stairs.  Make sure that you have a light switch at the top of the stairs and the bottom of the stairs. If you do  not have them, ask someone to add them for you.  Make sure that there are handrails on both sides of the stairs, and use them. Fix handrails that are broken or loose. Make sure that handrails are as long as the stairways.  Install non-slip stair treads on all stairs in your home.  Avoid having throw rugs at the top or bottom of the stairs. If you do have throw rugs, attach them to the floor with carpet tape.  Choose a carpet that does not hide the edge of the steps on the stairway.  Check any carpeting to make sure that it is firmly attached to the stairs. Fix any carpet that is loose or worn. What can I do on the outside of my home?  Use bright outdoor lighting.  Regularly fix the edges of walkways and driveways and fix any cracks.  Remove anything that might make you trip as you walk through a door, such as a raised step or threshold.  Trim any bushes or trees on the path to your home.  Regularly check to see if handrails are loose or broken. Make sure that both sides of any steps have handrails.  Install guardrails along the edges of any raised decks and porches.  Clear walking paths of anything that might make someone trip, such as tools or rocks.  Have any leaves, snow, or ice cleared regularly.  Use sand or salt on walking paths during winter.  Clean up any spills in your garage right away. This includes grease or oil spills. What other actions can I take?  Wear shoes that: ? Have a low heel. Do not wear high heels. ? Have rubber bottoms. ? Are comfortable and fit you well. ? Are closed at the toe. Do not wear open-toe sandals.  Use tools that help you move around (mobility aids) if they are needed. These include: ? Canes. ? Walkers. ? Scooters. ? Crutches.  Review your medicines with your doctor. Some medicines can make you feel dizzy. This can increase your chance of falling. Ask your doctor what other things you can do to help prevent falls. Where to find more  information  Centers for Disease Control and Prevention, STEADI: https://garcia.biz/  [employer] on Aging: BrainJudge.co.uk Contact a doctor if:  You are afraid of falling at home.  You feel weak, drowsy, or dizzy at home.  You fall at home. Summary  There are many simple things that you can do to make your home safe and to help prevent falls.  Ways to make your home safe include removing tripping hazards and installing grab bars in the bathroom.  Ask for help when making these changes in your home. This information is not intended to replace advice given to you by your health care provider. Make sure you discuss any questions you have with your health care provider. Document Revised: 05/05/2018 Document Reviewed: 08/27/2016 Elsevier Patient Education  2020 [place] Maintenance, Female Adopting a healthy lifestyle and getting preventive care are important in promoting health and wellness. Ask your health care provider about:  The right schedule for you to have regular tests and exams.  Things you can  do on your own to prevent diseases and keep yourself healthy. What should I know about diet, weight, and exercise? Eat a healthy diet   Eat a diet that includes plenty of vegetables, fruits, low-fat dairy products, and lean protein.  Do not eat a lot of foods that are high in solid fats, added sugars, or sodium. Maintain a healthy weight Body mass index (BMI) is used to identify weight problems. It estimates body fat based on height and weight. Your health care provider can help determine your BMI and help you achieve or maintain a healthy weight. Get regular exercise Get regular exercise. This is one of the most important things you can do for your health. Most adults should:  Exercise for at least 150 minutes each week. The exercise should increase your heart rate and make you sweat (moderate-intensity exercise).  Do strengthening exercises at  least twice a week. This is in addition to the moderate-intensity exercise.  Spend less time sitting. Even light physical activity can be beneficial. Watch cholesterol and blood lipids Have your blood tested for lipids and cholesterol at 72 years of age, then have this test every 5 years. Have your cholesterol levels checked more often if:  Your lipid or cholesterol levels are high.  You are older than 72 years of age.  You are at high risk for heart disease. What should I know about cancer screening? Depending on your health history and family history, you may need to have cancer screening at various ages. This may include screening for:  Breast cancer.  Cervical cancer.  Colorectal cancer.  Skin cancer.  Lung cancer. What should I know about heart disease, diabetes, and high blood pressure? Blood pressure and heart disease  High blood pressure causes heart disease and increases the risk of stroke. This is more likely to develop in people who have high blood pressure readings, are of African descent, or are overweight.  Have your blood pressure checked: ? Every 3-5 years if you are 58-62 years of age. ? Every year if you are 77 years old or older. Diabetes Have regular diabetes screenings. This checks your fasting blood sugar level. Have the screening done:  Once every three years after age 24 if you are at a normal weight and have a low risk for diabetes.  More often and at a younger age if you are overweight or have a high risk for diabetes. What should I know about preventing infection? Hepatitis B If you have a higher risk for hepatitis B, you should be screened for this virus. Talk with your health care provider to find out if you are at risk for hepatitis B infection. Hepatitis C Testing is recommended for:  Everyone born from [birth year] through 1963.  Anyone with known risk factors for hepatitis C. Sexually transmitted infections (STIs)  Get screened for STIs,  including gonorrhea and chlamydia, if: ? You are sexually active and are younger than 72 years of age. ? You are older than 72 years of age and your health care provider tells you that you are at risk for this type of infection. ? Your sexual activity has changed since you were last screened, and you are at increased risk for chlamydia or gonorrhea. Ask your health care provider if you are at risk.  Ask your health care provider about whether you are at high risk for HIV. Your health care provider may recommend a prescription medicine to help prevent HIV infection. If you choose to take medicine  to prevent HIV, you should first get tested for HIV. You should then be tested every 3 months for as long as you are taking the medicine. Pregnancy  If you are about to stop having your period (premenopausal) and you may become pregnant, seek counseling before you get pregnant.  Take 400 to 800 micrograms (mcg) of folic acid every day if you become pregnant.  Ask for birth control (contraception) if you want to prevent pregnancy. Osteoporosis and menopause Osteoporosis is a disease in which the bones lose minerals and strength with aging. This can result in bone fractures. If you are 10 years old or older, or if you are at risk for osteoporosis and fractures, ask your health care provider if you should:  Be screened for bone loss.  Take a calcium or vitamin D supplement to lower your risk of fractures.  Be given hormone replacement therapy (HRT) to treat symptoms of menopause. Follow these instructions at home: Lifestyle  Do not use any products that contain nicotine or tobacco, such as cigarettes, e-cigarettes, and chewing tobacco. If you need help quitting, ask your health care provider.  Do not use street drugs.  Do not share needles.  Ask your health care provider for help if you need support or information about quitting drugs. Alcohol use  Do not drink alcohol if: ? Your health care  provider tells you not to drink. ? You are pregnant, may be pregnant, or are planning to become pregnant.  If you drink alcohol: ? Limit how much you use to 0-1 drink a day. ? Limit intake if you are breastfeeding.  Be aware of how much alcohol is in your drink. In the [place] one drink equals one 12 oz bottle of beer (355 mL), one 5 oz glass of wine (148 mL), or one 1 oz glass of hard liquor (44 mL). General instructions  Schedule regular health, dental, and eye exams.  Stay current with your vaccines.  Tell your health care provider if: ? You often feel depressed. ? You have ever been abused or do not feel safe at home. Summary  Adopting a healthy lifestyle and getting preventive care are important in promoting health and wellness.  Follow your health care provider's instructions about healthy diet, exercising, and getting tested or screened for diseases.  Follow your health care provider's instructions on monitoring your cholesterol and blood pressure. This information is not intended to replace advice given to you by your health care provider. Make sure you discuss any questions you have with your health care provider. Document Revised: 01/05/2018 Document Reviewed: 01/05/2018 Elsevier Patient Education  2020 [employer].   Steps to Quit Smoking Smoking tobacco is the leading cause of preventable death. It can affect almost every organ in the body. Smoking puts you and those around you at risk for developing many serious chronic diseases. Quitting smoking can be difficult, but it is one of the best things that you can do for your health. It is never too late to quit. How do I get ready to quit? When you decide to quit smoking, create a plan to help you succeed. Before you quit:  Pick a date to quit. Set a date within the next 2 weeks to give you time to prepare.  Write down the reasons why you are quitting. Keep this list in places where you will see it often.  Tell your  family, friends, and co-workers that you are quitting. Support from your loved ones can make quitting  easier.  Talk with your health care provider about your options for quitting smoking.  Find out what treatment options are covered by your health insurance.  Identify people, places, things, and activities that make you want to smoke (triggers). Avoid them. What first steps can I take to quit smoking?  Throw away all cigarettes at home, at work, and in your car.  Throw away smoking accessories, such as Scientist, research (medical).  Clean your car. Make sure to empty the ashtray.  Clean your home, including curtains and carpets. What strategies can I use to quit smoking? Talk with your health care provider about combining strategies, such as taking medicines while you are also receiving in-person counseling. Using these two strategies together makes you more likely to succeed in quitting than if you used either strategy on its own.  If you are pregnant or breastfeeding, talk with your health care provider about finding counseling or other support strategies to quit smoking. Do not take medicine to help you quit smoking unless your health care provider tells you to do so. To quit smoking: Quit right away  Quit smoking completely, instead of gradually reducing how much you smoke over a period of time. Research shows that stopping smoking right away is more successful than gradually quitting.  Attend in-person counseling to help you build problem-solving skills. You are more likely to succeed in quitting if you attend counseling sessions regularly. Even short sessions of 10 minutes can be effective. Take medicine You may take medicines to help you quit smoking. Some medicines require a prescription and some you can purchase over-the-counter. Medicines may have nicotine in them to replace the nicotine in cigarettes. Medicines may:  Help to stop cravings.  Help to relieve withdrawal  symptoms. Your health care provider may recommend:  Nicotine patches, gum, or lozenges.  Nicotine inhalers or sprays.  Non-nicotine medicine that is taken by mouth. Find resources Find resources and support systems that can help you to quit smoking and remain smoke-free after you quit. These resources are most helpful when you use them often. They include:  Online chats with a Social worker.  Telephone quitlines.  Printed Furniture conservator/restorer.  Support groups or group counseling.  Text messaging programs.  Mobile phone apps or applications. Use apps that can help you stick to your quit plan by providing reminders, tips, and encouragement. There are many free apps for mobile devices as well as websites. Examples include Quit Guide from the [employer] and smokefree.gov What things can I do to make it easier to quit?   Reach out to your family and friends for support and encouragement. Call telephone quitlines (1-800-QUIT-NOW), reach out to support groups, or work with a counselor for support.  Ask people who smoke to avoid smoking around you.  Avoid places that trigger you to smoke, such as bars, parties, or smoke-break areas at work.  Spend time with people who do not smoke.  Lessen the stress in your life. Stress can be a smoking trigger for some people. To lessen stress, try: ? Exercising regularly. ? Doing deep-breathing exercises. ? Doing yoga. ? Meditating. ? Performing a body scan. This involves closing your eyes, scanning your body from head to toe, and noticing which parts of your body are particularly tense. Try to relax the muscles in those areas. How will I feel when I quit smoking? Day 1 to 3 weeks Within the first 24 hours of quitting smoking, you may start to feel withdrawal symptoms. These symptoms are usually  most noticeable 2-3 days after quitting, but they usually do not last for more than 2-3 weeks. You may experience these symptoms:  Mood swings.  Restlessness,  anxiety, or irritability.  Trouble concentrating.  Dizziness.  Strong cravings for sugary foods and nicotine.  Mild weight gain.  Constipation.  Nausea.  Coughing or a sore throat.  Changes in how the medicines that you take for unrelated issues work in your body.  Depression.  Trouble sleeping (insomnia). Week 3 and afterward After the first 2-3 weeks of quitting, you may start to notice more positive results, such as:  Improved sense of smell and taste.  Decreased coughing and sore throat.  Slower heart rate.  Lower blood pressure.  Clearer skin.  The ability to breathe more easily.  Fewer sick days. Quitting smoking can be very challenging. Do not get discouraged if you are not successful the first time. Some people need to make many attempts to quit before they achieve long-term success. Do your best to stick to your quit plan, and talk with your health care provider if you have any questions or concerns. Summary  Smoking tobacco is the leading cause of preventable death. Quitting smoking is one of the best things that you can do for your health.  When you decide to quit smoking, create a plan to help you succeed.  Quit smoking right away, not slowly over a period of time.  When you start quitting, seek help from your health care provider, family, or friends. This information is not intended to replace advice given to you by your health care provider. Make sure you discuss any questions you have with your health care provider. Document Revised: 10/07/2018 Document Reviewed: 04/02/2018 Elsevier Patient Education  [employer].

## 2019-05-23 ENCOUNTER — Encounter: Payer: Self-pay | Admitting: *Deleted

## 2019-05-24 NOTE — Progress Notes (Signed)
Internal Medicine Clinic Attending  Case discussed with Dr. Ronnald Ramp at the time of the visit.  We reviewed the AWV findings.  I agree with the assessment, diagnosis, and plan of care documented in the AWV note.

## 2019-06-28 ENCOUNTER — Other Ambulatory Visit: Payer: Self-pay | Admitting: Internal Medicine

## 2019-06-28 DIAGNOSIS — Z1231 Encounter for screening mammogram for malignant neoplasm of breast: Secondary | ICD-10-CM

## 2019-07-03 ENCOUNTER — Encounter: Payer: Medicare Other | Admitting: Internal Medicine

## 2019-07-17 ENCOUNTER — Ambulatory Visit (INDEPENDENT_AMBULATORY_CARE_PROVIDER_SITE_OTHER): Payer: Medicare Other | Admitting: Internal Medicine

## 2019-07-17 ENCOUNTER — Other Ambulatory Visit: Payer: Self-pay

## 2019-07-17 ENCOUNTER — Encounter: Payer: Self-pay | Admitting: Internal Medicine

## 2019-07-17 VITALS — BP 151/62 | HR 79 | Temp 98.5°F | Ht 62.0 in | Wt 133.8 lb

## 2019-07-17 DIAGNOSIS — M79642 Pain in left hand: Secondary | ICD-10-CM | POA: Diagnosis not present

## 2019-07-17 DIAGNOSIS — M25512 Pain in left shoulder: Secondary | ICD-10-CM

## 2019-07-17 DIAGNOSIS — M79641 Pain in right hand: Secondary | ICD-10-CM

## 2019-07-17 DIAGNOSIS — I10 Essential (primary) hypertension: Secondary | ICD-10-CM

## 2019-07-17 MED ORDER — NAPROXEN 250 MG PO TABS: 250.0000 mg | ORAL_TABLET | Freq: Two times a day (BID) | ORAL | 0 refills | Status: AC

## 2019-07-17 NOTE — Patient Instructions (Signed)
Ms. Shisler,   I sent a prescription for naproxen for your shoulder and hand pain.  You can take 1 tablet twice a day.  If the pain does not improve in the next 4 to 6 weeks, I recommend we do x-rays of your shoulder.  I also made a referral to physical therapy to help with shoulder exercises.   For your left hand please buy a wrist splint at your nearest pharmacy and wear it during the day.   Please come back for follow-up appointment in 2 to 3 months or sooner if you need to.  - Dr. Frederico Hamman

## 2019-07-18 ENCOUNTER — Encounter: Payer: Self-pay | Admitting: Internal Medicine

## 2019-07-18 DIAGNOSIS — M25512 Pain in left shoulder: Secondary | ICD-10-CM | POA: Insufficient documentation

## 2019-07-18 HISTORY — DX: Pain in left shoulder: M25.512

## 2019-07-18 NOTE — Assessment & Plan Note (Signed)
BP elevated today.  Patient has not taken her medication today.  States she usually takes them at night.  Advised patient to take her medications in the morning after she wakes up and eats.

## 2019-07-18 NOTE — Assessment & Plan Note (Signed)
Patient presents for bilateral hand pain for 2 to 3 months. L is worse than R. She points to both of her thenar eminences as the location of the pain.  Denies PIP, DIP, and MCP joint pain.  She denies numbness and tingling or symptoms of carpal tunnel syndrome.  She has not identified any triggers or alleviating factors.  On exam there are no gross deformities. There is no strength or sensory deficit.  Etiology is unclear to me at this time.  I advised her to use a wrist splint on the left hand to see if symptoms improve.  If so advised her to wear a splint in both hands during the day.

## 2019-07-18 NOTE — Progress Notes (Signed)
Internal Medicine Clinic Attending  Case discussed with Dr. Santos-Sanchez at the time of the visit.  We reviewed the resident's history and exam and pertinent patient test results.  I agree with the assessment, diagnosis, and plan of care documented in the resident's note.  Aurore Redinger, M.D., Ph.D.  

## 2019-07-18 NOTE — Progress Notes (Signed)
   CC: HTN follow up, hand and L should pain   HPI:  Kylie Torres is a 74 y.o. year-old female with PMH listed below who presents to clinic for HTN follow up, hand and L should pain . Please see problem based assessment and plan for further details.   Past Medical History:  Diagnosis Date  . Alcohol abuse   . Blood transfusion without reported diagnosis   . Cirrhosis ([place])   . Gastric ulcer   . Hypertension   . Hypertensive retinopathy    OU   Review of Systems:  Review of Systems  Constitutional: Negative for malaise/fatigue.  Respiratory: Negative for shortness of breath.   Cardiovascular: Negative for chest pain and leg swelling.  Musculoskeletal: Positive for joint pain (L shoulder). Negative for back pain, myalgias and neck pain.  Neurological: Negative for dizziness and headaches.    Physical Exam:  Vitals:   07/17/19 1423 07/17/19 1424  BP:  (!) 151/62  Pulse:  79  Temp:  98.5 F (36.9 C)  TempSrc:  Oral  SpO2:  99%  Weight: 133 lb 12.8 oz (60.7 kg)   Height: 5\' 2"  (1.575 m)     General: well-appearing elderly female in NAD  Cardiac: regular rate and rhythm, nl S1/S2, no murmurs, rubs or gallops Pulm: CTAB, no wheezes or crackles, no increased work of breathing on room air  MSK: pain limits active ROM in all directions but full ROM on passive ROM. No weakness or sensory deficits noted. No gross abnormalities noted on bilateral hands. No thenar atrophy noted.    Assessment & Plan:   See Encounters Tab for problem based charting.  Patient discussed with Dr. Rebeca Alert

## 2019-07-18 NOTE — Assessment & Plan Note (Signed)
Patient presents complaining of a 1 month history of L shoulder pain.  She denies any inciting trauma or recent falls.  No recent illness.  Has been using BenGay and Tylenol PM for pain which has been helping.  She notices pain in her shoulder with any type of movement but is still able to perform ADLs independently.  On exam she does have significant pain with active range of motion in any direction but does have on passive movement.  I discussed with patient this may be osteoarthritis or a rotator cuff injury and recommended imaging but she declined.  She would like to try something for the pain at this time and do imaging if it does not improve within the next 4 weeks.  - Naproxen 250 mg twice daily x 1-2 weeks - Referral to physical therapy - Imaging if no improvement

## 2019-07-27 ENCOUNTER — Encounter: Payer: Self-pay | Admitting: *Deleted

## 2019-08-29 NOTE — Addendum Note (Signed)
Addended by: Hulan Fray on: 08/29/2019 05:17 PM   Modules accepted: Orders

## 2019-08-30 ENCOUNTER — Encounter (INDEPENDENT_AMBULATORY_CARE_PROVIDER_SITE_OTHER): Payer: Medicare Other | Admitting: Ophthalmology

## 2019-09-14 ENCOUNTER — Ambulatory Visit
Admission: RE | Admit: 2019-09-14 | Discharge: 2019-09-14 | Disposition: A | Discharge status: Home / Self Care | Payer: Medicare Other | Source: Ambulatory Visit | Attending: Internal Medicine | Admitting: Internal Medicine

## 2019-09-14 ENCOUNTER — Ambulatory Visit
Admission: RE | Admit: 2019-09-14 | Discharge: 2019-09-14 | Disposition: A | Discharge status: Home / Self Care | Payer: Medicare Other | Source: Ambulatory Visit | Attending: *Deleted | Admitting: *Deleted

## 2019-09-14 DIAGNOSIS — Z1231 Encounter for screening mammogram for malignant neoplasm of breast: Secondary | ICD-10-CM

## 2019-09-14 DIAGNOSIS — E2839 Other primary ovarian failure: Secondary | ICD-10-CM

## 2019-09-14 DIAGNOSIS — Z780 Asymptomatic menopausal state: Secondary | ICD-10-CM | POA: Diagnosis not present

## 2019-09-14 DIAGNOSIS — M810 Age-related osteoporosis without current pathological fracture: Secondary | ICD-10-CM | POA: Diagnosis not present

## 2019-11-29 IMAGING — MG DIGITAL SCREENING BILATERAL MAMMOGRAM WITH CAD
5 series · 5 of 5 positions shown · non-contrast
Comparison: Previous exam(s).

CLINICAL DATA: Screening.

EXAM:
DIGITAL SCREENING BILATERAL MAMMOGRAM WITH CAD

[R CC]
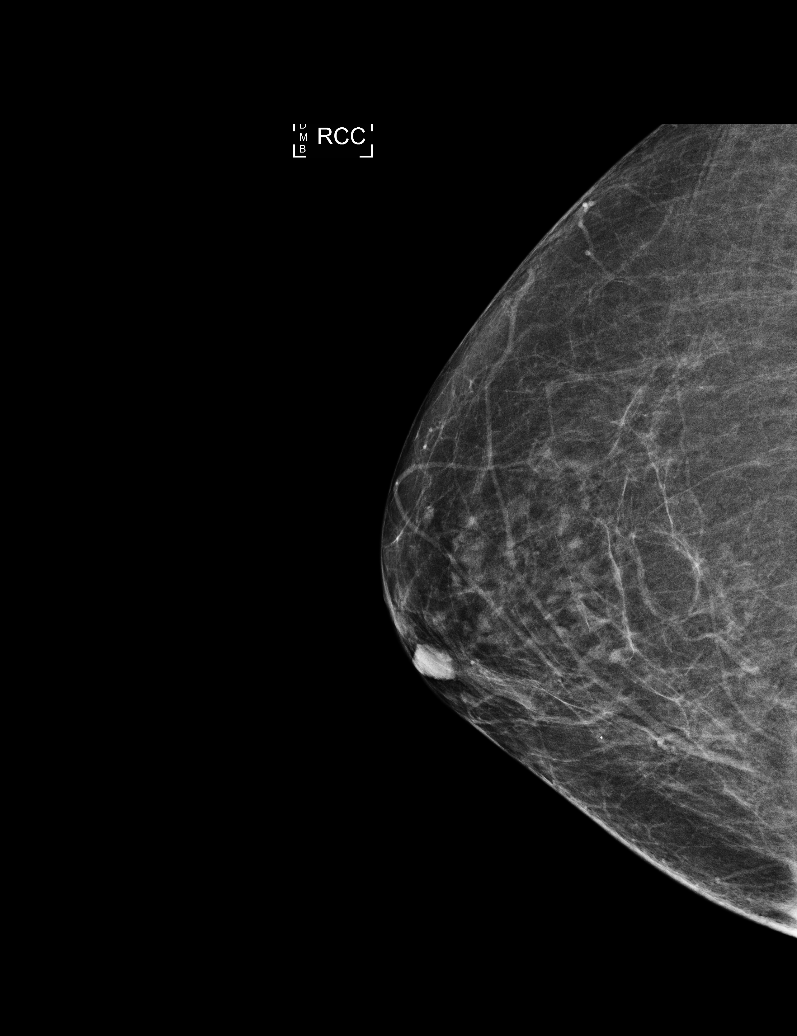

[R MLO]
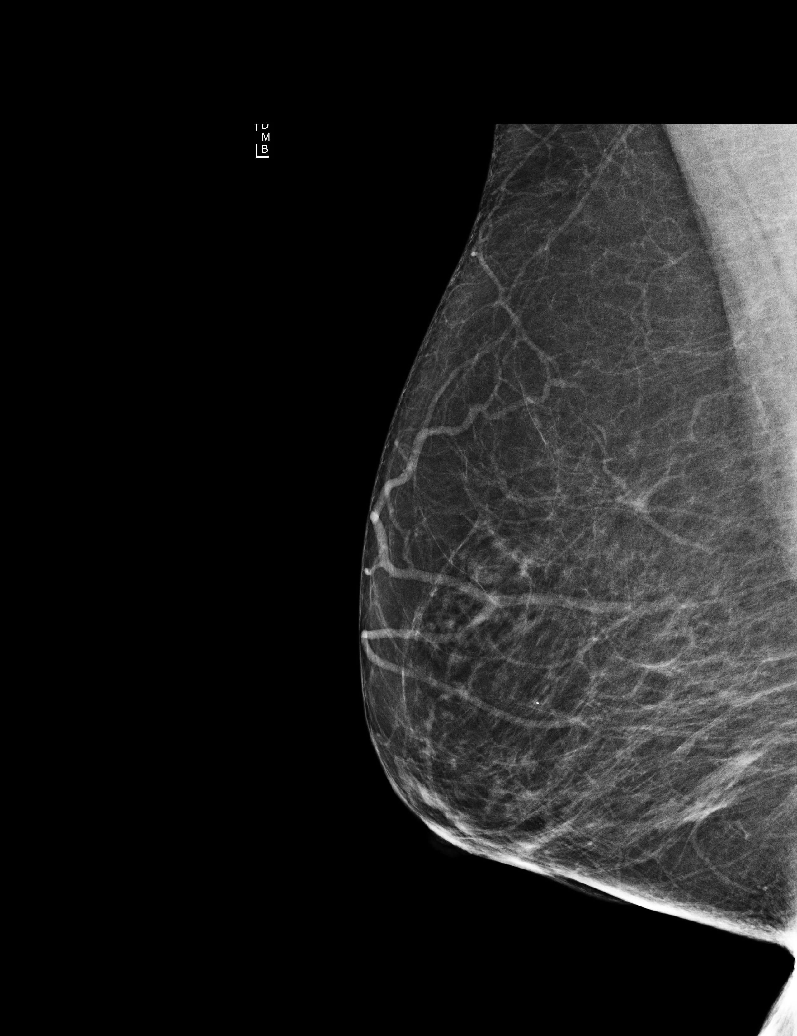

[L MLO (1 of 2)]
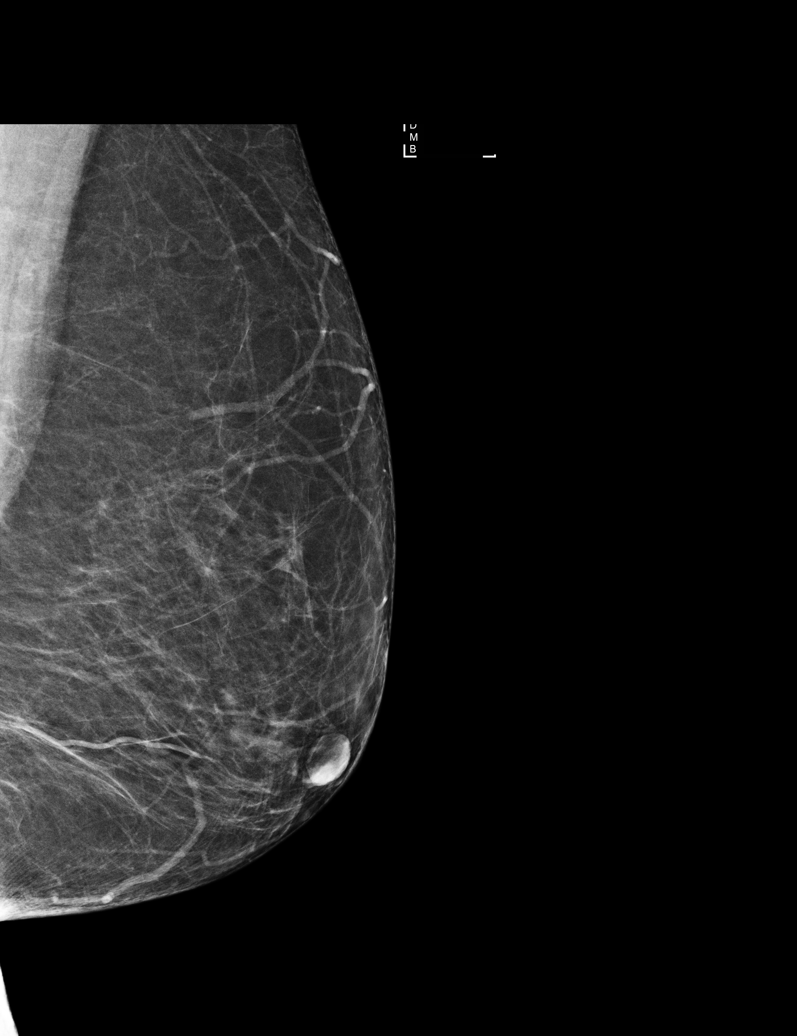

[L MLO (2 of 2)]
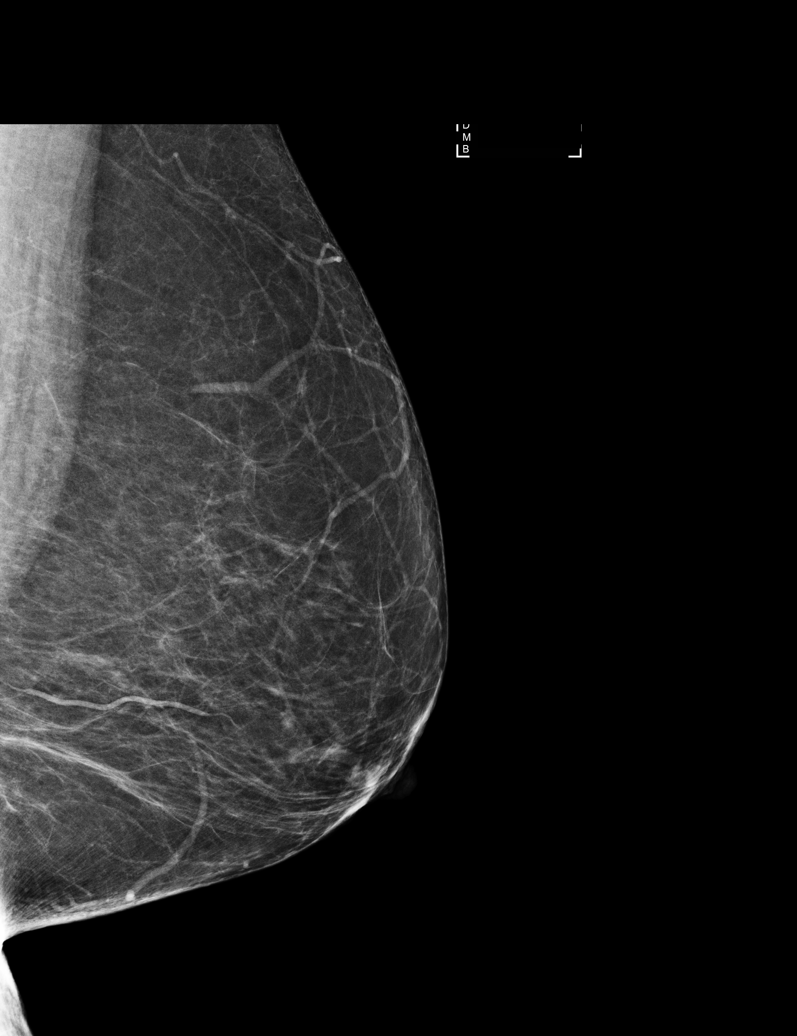

[L CC]
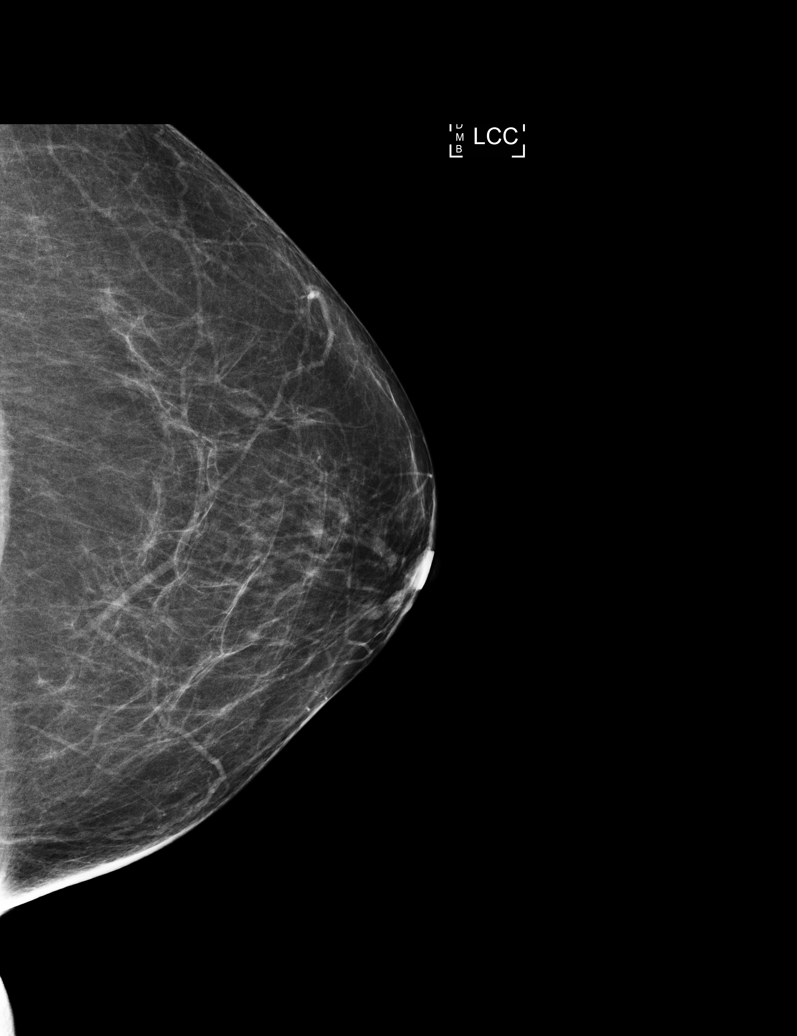

[5 of 5 positions shown; findings below may reference images not displayed]

ACR Breast Density Category b: There are scattered areas of
fibroglandular density.
FINDINGS: There are no findings suspicious for malignancy. Images were
processed with CAD.
IMPRESSION: No mammographic evidence of malignancy. A result letter of this
screening mammogram will be mailed directly to the patient.

RECOMMENDATION:
Screening mammogram in one year. (Code:AS-G-LCT)

BI-RADS CATEGORY  1: Negative.

## 2019-12-13 ENCOUNTER — Encounter: Payer: Self-pay | Admitting: Internal Medicine

## 2019-12-13 ENCOUNTER — Other Ambulatory Visit: Payer: Self-pay

## 2019-12-13 ENCOUNTER — Ambulatory Visit (INDEPENDENT_AMBULATORY_CARE_PROVIDER_SITE_OTHER): Payer: Medicare Other | Admitting: Internal Medicine

## 2019-12-13 VITALS — BP 138/77 | HR 77 | Temp 98.3°F | Ht 62.0 in | Wt 134.0 lb

## 2019-12-13 DIAGNOSIS — I739 Peripheral vascular disease, unspecified: Secondary | ICD-10-CM | POA: Diagnosis not present

## 2019-12-13 DIAGNOSIS — M810 Age-related osteoporosis without current pathological fracture: Secondary | ICD-10-CM | POA: Diagnosis not present

## 2019-12-13 DIAGNOSIS — Z23 Encounter for immunization: Secondary | ICD-10-CM

## 2019-12-13 DIAGNOSIS — E782 Mixed hyperlipidemia: Secondary | ICD-10-CM

## 2019-12-13 DIAGNOSIS — I10 Essential (primary) hypertension: Secondary | ICD-10-CM

## 2019-12-13 DIAGNOSIS — I429 Cardiomyopathy, unspecified: Secondary | ICD-10-CM | POA: Diagnosis not present

## 2019-12-13 DIAGNOSIS — E559 Vitamin D deficiency, unspecified: Secondary | ICD-10-CM

## 2019-12-13 MED ORDER — DILTIAZEM HCL ER COATED BEADS 240 MG PO CP24: 240.0000 mg | ORAL_CAPSULE | Freq: Every day | ORAL | 3 refills | Status: DC

## 2019-12-13 MED ORDER — ALENDRONATE SODIUM 70 MG PO TABS: 70.0000 mg | ORAL_TABLET | ORAL | 5 refills | Status: DC

## 2019-12-13 NOTE — Assessment & Plan Note (Signed)
Kylie Torres has a past medical history of obstructive cardiomyopathy worked up by [facility] cardiology starting in 2017.  She has not had an echo in 2 years, and has not followed up with her cardiologist in approximately a year.  She states she has no difficulty or problem with her cardiologist, but just does not feel like she was a follow-up that she feels well.  She is willing to have a repeat echo though  Assessment/plan: -Strongly recommended she follow-up with cardiology again for routine examination -TTE ordered

## 2019-12-13 NOTE — Assessment & Plan Note (Addendum)
Ms./denies any difficulty with walking or any pain with walking.  She denies her feet feeling cold to touch.  She has recently picked smoking back up but is trying to quit at this time.  Assessment/plan: Patient is not on statin or aspirin.  We will repeat a lipid panel and discuss addition of statin.  -Recommended smoking cessation -Lipid panel pending

## 2019-12-13 NOTE — Progress Notes (Signed)
   CC: HTN, cardiomyopathy   HPI:  [name] is a 74 y.o. with a PMHx as listed who presents to the clinic for HTN, cardiomyopathy.   Please see the Encounters tab for problem-based Assessment & Plan regarding status of patient's acute and chronic conditions.  Past Medical History:  Diagnosis Date  . Acute pain of left shoulder 07/18/2019  . Alcohol abuse   . Blood transfusion without reported diagnosis   . Cirrhosis ([place])   . Gastric ulcer   . Hypertension   . Hypertensive retinopathy    OU   Review of Systems: Review of Systems  Constitutional: Negative for chills, fever, malaise/fatigue and weight loss.  Respiratory: Negative for cough, hemoptysis and wheezing.   Cardiovascular: Negative for chest pain, palpitations and leg swelling.  Gastrointestinal: Negative for abdominal pain, diarrhea, nausea and vomiting.  Musculoskeletal: Negative for falls, joint pain and myalgias.  Neurological: Negative for dizziness, focal weakness, weakness and headaches.   Physical Exam:  Vitals:   12/13/19 1442 12/13/19 1447  BP: (!) 148/79 138/77  Pulse: 80 77  Temp: 98.3 F (36.8 C)   TempSrc: Oral   SpO2: 99%   Weight: 134 lb (60.8 kg)   Height: 5\' 2"  (1.575 m)     Physical Exam Vitals and nursing note reviewed.  Constitutional:      General: She is not in acute distress.    Appearance: She is normal weight.  HENT:     Head: Normocephalic and atraumatic.  Cardiovascular:     Rate and Rhythm: Normal rate and regular rhythm.     Heart sounds: Murmur (systolic ) heard.   Pulmonary:     Effort: Pulmonary effort is normal. No respiratory distress.     Breath sounds: No wheezing, rhonchi or rales.  Abdominal:     General: Bowel sounds are normal. There is no distension.     Palpations: Abdomen is soft.     Tenderness: There is no abdominal tenderness. There is no guarding.  Musculoskeletal:     Right lower leg: No edema.     Left lower leg: No edema.  Skin:     General: Skin is warm and dry.  Neurological:     General: No focal deficit present.     Mental Status: She is alert and oriented to person, place, and time. Mental status is at baseline.  Psychiatric:        Mood and Affect: Mood normal.        Behavior: Behavior normal.        Thought Content: Thought content normal.        Judgment: Judgment normal.    Assessment & Plan:   See Encounters Tab for problem based charting.  Patient discussed with Dr. Philipp Ovens

## 2019-12-13 NOTE — Assessment & Plan Note (Addendum)
Kylie Torres had a DEXA scan back in August 2021 that showed results significant for a T score of -3.  She denies any history of fractures or any known history of osteoporosis.  She denies any family history of osteoporosis.  Kylie Torres states that she walks without any assistive devices and has not had any falls in the past few months.  She feels generally steady on her feet.  Assessment/plan: Patient has a quite elevated T score and several risk factors including age, current smoking.  Due to this, we discussed the benefits of starting biphosphonate.  Kylie Torres is in agreement to start at this time.  -Start Alendronate 70 once per week -DEXA scan will be needed in August 2023 -We will consider initiation of vitamin D supplementation pending vitamin D level results -CBC pending

## 2019-12-13 NOTE — Assessment & Plan Note (Addendum)
BP: 138/77   Kylie Torres denies any difficulty with her medication at this time.  She is requesting a refill.   Assessment/plan: Blood pressure within goal.  Per chart review it looks like she has been generally intolerant of beta-blockers, and diuretics are contraindicated due to her obstructive cardiomyopathy.  Seems she has been doing well on diltiazem for at least over a year now.  -Refilled diltiazem -CMP pending

## 2019-12-13 NOTE — Patient Instructions (Addendum)
It was nice seeing you today! Thank you for choosing [facility] for your Primary Care.    Today we talked about:   1. I refilled your Diltiazem 2. I ordered blood work today and will call you with the results 3. We started a new medication today called Alendronate (Fosamax). Take it once per week, first thing in the morning 30 minute before food. Take with a full glass of water and do not take it with other medications.

## 2019-12-14 LAB — CMP14 + ANION GAP
ALT: 7 IU/L (ref 0–32)
AST: 13 IU/L (ref 0–40)
Albumin/Globulin Ratio: 1.7 (ref 1.2–2.2)
Albumin: 4.4 g/dL (ref 3.7–4.7)
Alkaline Phosphatase: 134 IU/L — ABNORMAL HIGH (ref 44–121)
Anion Gap: 14 mmol/L (ref 10.0–18.0)
BUN/Creatinine Ratio: 17 (ref 12–28)
BUN: 13 mg/dL (ref 8–27)
Bilirubin Total: 0.5 mg/dL (ref 0.0–1.2)
CO2: 19 mmol/L — ABNORMAL LOW (ref 20–29)
Calcium: 9.7 mg/dL (ref 8.7–10.3)
Chloride: 109 mmol/L — ABNORMAL HIGH (ref 96–106)
Creatinine, Ser: 0.75 mg/dL (ref 0.57–1.00)
GFR calc Af Amer: 92 mL/min/{1.73_m2} (ref >59)
GFR calc non Af Amer: 80 mL/min/{1.73_m2} (ref >59)
Globulin, Total: 2.6 g/dL (ref 1.5–4.5)
Glucose: 86 mg/dL (ref 65–99)
Potassium: 4.2 mmol/L (ref 3.5–5.2)
Sodium: 142 mmol/L (ref 134–144)
Total Protein: 7 g/dL (ref 6.0–8.5)

## 2019-12-14 LAB — LIPID PANEL
Chol/HDL Ratio: 5 ratio — ABNORMAL HIGH (ref 0.0–4.4)
Cholesterol, Total: 201 mg/dL — ABNORMAL HIGH (ref 100–199)
HDL: 40 mg/dL (ref >39)
LDL Chol Calc (NIH): 143 mg/dL — ABNORMAL HIGH (ref 0–99)
Triglycerides: 99 mg/dL (ref 0–149)
VLDL Cholesterol Cal: 18 mg/dL (ref 5–40)

## 2019-12-14 LAB — CBC
Hematocrit: 40.8 % (ref 34.0–46.6)
Hemoglobin: 13.5 g/dL (ref 11.1–15.9)
MCH: 28.5 pg (ref 26.6–33.0)
MCHC: 33.1 g/dL (ref 31.5–35.7)
MCV: 86 fL (ref 79–97)
Platelets: 255 10*3/uL (ref 150–450)
RBC: 4.73 x10E6/uL (ref 3.77–5.28)
RDW: 14.6 % (ref 11.7–15.4)
WBC: 10.7 10*3/uL (ref 3.4–10.8)

## 2019-12-14 LAB — VITAMIN D 25 HYDROXY (VIT D DEFICIENCY, FRACTURES): Vit D, 25-Hydroxy: 9.1 ng/mL — ABNORMAL LOW (ref 30.0–100.0)

## 2019-12-18 NOTE — Progress Notes (Signed)
Internal Medicine Clinic Attending  Case discussed with Dr. Basaraba  At the time of the visit.  We reviewed the resident's history and exam and pertinent patient test results.  I agree with the assessment, diagnosis, and plan of care documented in the resident's note.  

## 2019-12-20 ENCOUNTER — Telehealth: Payer: Self-pay | Admitting: Internal Medicine

## 2019-12-20 DIAGNOSIS — E785 Hyperlipidemia, unspecified: Secondary | ICD-10-CM | POA: Insufficient documentation

## 2019-12-20 DIAGNOSIS — E559 Vitamin D deficiency, unspecified: Secondary | ICD-10-CM | POA: Insufficient documentation

## 2019-12-20 MED ORDER — ROSUVASTATIN CALCIUM 20 MG PO TABS: 20.0000 mg | ORAL_TABLET | Freq: Every day | ORAL | 5 refills | Status: DC

## 2019-12-20 MED ORDER — VITAMIN D (ERGOCALCIFEROL) 1.25 MG (50000 UNIT) PO CAPS: 50000.0000 [IU] | ORAL_CAPSULE | ORAL | 1 refills | Status: DC

## 2019-12-20 NOTE — Telephone Encounter (Signed)
Contacted patient to discuss results from recent visit. Please see OV note from 11/17 for details.

## 2019-12-20 NOTE — Assessment & Plan Note (Addendum)
ADDENDUM: (12/20/2019) Lipid Panel     Component Value Date/Time   CHOL 201 (H) 12/13/2019 1553   TRIG 99 12/13/2019 1553   HDL 40 12/13/2019 1553   CHOLHDL 5.0 (H) 12/13/2019 1553   LDLCALC 143 (H) 12/13/2019 1553   LABVLDL 18 12/13/2019 1553   10 year ASCVD risk score of 26%. Discussed this with Kylie Torres and she is in agreement to work on diet and start a statin.   - Rosuvastatin 20mg  sent to pharmacy

## 2019-12-20 NOTE — Assessment & Plan Note (Addendum)
ADDENDUM: (12/20/2019)  Vitamin D levels checked at visit due to new diagnosis of Osteoporosis. She is significantly low at 9. Discussed with Ms. Hodge the need to start supplementation. She is in agreement. Will start with high dose therapy.   - Cholecalciferol 50,000 units once a week  - Recheck Vitamin D in 6-12 weeks

## 2019-12-20 NOTE — Addendum Note (Signed)
Addended by: Jose Persia on: 12/20/2019 10:17 AM   Modules accepted: Orders

## 2020-02-15 ENCOUNTER — Ambulatory Visit (HOSPITAL_COMMUNITY)
Admission: RE | Admit: 2020-02-15 | Discharge: 2020-02-15 | Disposition: A | Discharge status: Home / Self Care | Payer: Medicare Other | Source: Ambulatory Visit | Attending: Student in an Organized Health Care Education/Training Program | Admitting: Student in an Organized Health Care Education/Training Program

## 2020-02-15 ENCOUNTER — Other Ambulatory Visit: Payer: Self-pay

## 2020-02-15 DIAGNOSIS — I429 Cardiomyopathy, unspecified: Secondary | ICD-10-CM | POA: Diagnosis not present

## 2020-02-15 DIAGNOSIS — I313 Pericardial effusion (noninflammatory): Secondary | ICD-10-CM | POA: Insufficient documentation

## 2020-02-15 DIAGNOSIS — I517 Cardiomegaly: Secondary | ICD-10-CM | POA: Insufficient documentation

## 2020-02-15 DIAGNOSIS — I428 Other cardiomyopathies: Secondary | ICD-10-CM

## 2020-02-15 LAB — ECHOCARDIOGRAM COMPLETE
Area-P 1/2: 1.51 cm2
S' Lateral: 2.2 cm

## 2020-02-15 NOTE — Progress Notes (Signed)
  Echocardiogram 2D Echocardiogram has been performed.  Kylie Torres 02/15/2020, 1:50 PM

## 2020-06-05 ENCOUNTER — Encounter: Payer: Self-pay | Admitting: Internal Medicine

## 2020-06-05 ENCOUNTER — Ambulatory Visit (INDEPENDENT_AMBULATORY_CARE_PROVIDER_SITE_OTHER): Payer: Medicare Other | Admitting: Internal Medicine

## 2020-06-05 ENCOUNTER — Other Ambulatory Visit: Payer: Self-pay

## 2020-06-05 VITALS — BP 171/77 | HR 64 | Temp 98.8°F | Ht 62.0 in | Wt 130.7 lb

## 2020-06-05 DIAGNOSIS — M79642 Pain in left hand: Secondary | ICD-10-CM

## 2020-06-05 DIAGNOSIS — E559 Vitamin D deficiency, unspecified: Secondary | ICD-10-CM | POA: Diagnosis not present

## 2020-06-05 DIAGNOSIS — I422 Other hypertrophic cardiomyopathy: Secondary | ICD-10-CM

## 2020-06-05 DIAGNOSIS — I10 Essential (primary) hypertension: Secondary | ICD-10-CM

## 2020-06-05 DIAGNOSIS — E782 Mixed hyperlipidemia: Secondary | ICD-10-CM | POA: Diagnosis not present

## 2020-06-05 DIAGNOSIS — R6881 Early satiety: Secondary | ICD-10-CM | POA: Diagnosis not present

## 2020-06-05 DIAGNOSIS — M79641 Pain in right hand: Secondary | ICD-10-CM | POA: Diagnosis not present

## 2020-06-05 MED ORDER — LOSARTAN POTASSIUM 25 MG PO TABS: 25.0000 mg | ORAL_TABLET | Freq: Every day | ORAL | 1 refills | Status: DC

## 2020-06-05 MED ORDER — PANTOPRAZOLE SODIUM 40 MG PO TBEC: 40.0000 mg | DELAYED_RELEASE_TABLET | Freq: Every day | ORAL | 0 refills | Status: DC

## 2020-06-05 NOTE — Patient Instructions (Addendum)
It was nice seeing you today! Thank you for choosing [facility] for your Primary Care.    Today we talked about:   1. Early Satiety: I have ordered imaging to be done of your stomach. In addition, start taking Protonix daily before breakfast.   2. Blood Pressure: I have added another medication called Losartan. Please make an appointment with your Cardiologist given history of heart failure.   3. Hand Pain: I would try over the counter hand pain gel Voltaren vs Icy-Hot   Follow up in 2-3 weeks

## 2020-06-06 DIAGNOSIS — R6881 Early satiety: Secondary | ICD-10-CM | POA: Insufficient documentation

## 2020-06-06 LAB — LIPID PANEL
Chol/HDL Ratio: 4.3 ratio (ref 0.0–4.4)
Cholesterol, Total: 168 mg/dL (ref 100–199)
HDL: 39 mg/dL — ABNORMAL LOW (ref >39)
LDL Chol Calc (NIH): 110 mg/dL — ABNORMAL HIGH (ref 0–99)
Triglycerides: 103 mg/dL (ref 0–149)
VLDL Cholesterol Cal: 19 mg/dL (ref 5–40)

## 2020-06-06 LAB — CBC
Hematocrit: 38.8 % (ref 34.0–46.6)
Hemoglobin: 13.1 g/dL (ref 11.1–15.9)
MCH: 29.2 pg (ref 26.6–33.0)
MCHC: 33.8 g/dL (ref 31.5–35.7)
MCV: 86 fL (ref 79–97)
Platelets: 307 10*3/uL (ref 150–450)
RBC: 4.49 x10E6/uL (ref 3.77–5.28)
RDW: 14 % (ref 11.7–15.4)
WBC: 9.8 10*3/uL (ref 3.4–10.8)

## 2020-06-06 LAB — CMP14 + ANION GAP
ALT: 7 IU/L (ref 0–32)
AST: 13 IU/L (ref 0–40)
Albumin/Globulin Ratio: 1.8 (ref 1.2–2.2)
Albumin: 4.4 g/dL (ref 3.7–4.7)
Alkaline Phosphatase: 122 IU/L — ABNORMAL HIGH (ref 44–121)
Anion Gap: 15 mmol/L (ref 10.0–18.0)
BUN/Creatinine Ratio: 17 (ref 12–28)
BUN: 13 mg/dL (ref 8–27)
Bilirubin Total: 0.4 mg/dL (ref 0.0–1.2)
CO2: 22 mmol/L (ref 20–29)
Calcium: 9.8 mg/dL (ref 8.7–10.3)
Chloride: 106 mmol/L (ref 96–106)
Creatinine, Ser: 0.75 mg/dL (ref 0.57–1.00)
Globulin, Total: 2.5 g/dL (ref 1.5–4.5)
Glucose: 79 mg/dL (ref 65–99)
Potassium: 5.1 mmol/L (ref 3.5–5.2)
Sodium: 143 mmol/L (ref 134–144)
Total Protein: 6.9 g/dL (ref 6.0–8.5)
eGFR: 84 mL/min/{1.73_m2} (ref >59)

## 2020-06-06 LAB — TSH: TSH: 1.28 u[IU]/mL (ref 0.450–4.500)

## 2020-06-06 LAB — VITAMIN D 25 HYDROXY (VIT D DEFICIENCY, FRACTURES): Vit D, 25-Hydroxy: 20.1 ng/mL — ABNORMAL LOW (ref 30.0–100.0)

## 2020-06-06 NOTE — Assessment & Plan Note (Signed)
Kylie Torres denies any leg edema, SOB or orthopnea at this time.   Assessment/Plan:  Echo recently obtained that showed preserved EF of 60-65% with severe ventricular hypertrophy. No evidence of obstructive component.   I suspect this is due to long term uncontrolled HTN. Previously followed with [facility], however last appointment was in July 2020. I have previously strongly encouraged patient to schedule a follow up visit and did so again today.   - Continue working on HTN management as listed below - No indication for diuretic at this time

## 2020-06-06 NOTE — Assessment & Plan Note (Signed)
BP Readings from Last 3 Encounters:  06/05/20 (!) 171/77  12/13/19 138/77  07/17/19 (!) 151/62   Kylie Torres states she is taking her Diltiazem most days. She continues working on decreasing salt intake. She denies any chest pain, SOB, dizziness, leg edema.   Assessment/Plan:  Blood pressure remains above goal. Discussed the need for an additional agent with Kylie Torres and she is reluctant but willing to try.   - Continue Diltiazem 240 mg daily - Start Losartan 25 mg daily - 2-3 week follow up

## 2020-06-06 NOTE — Assessment & Plan Note (Signed)
Lab Results  Component Value Date   CHOL 168 06/05/2020   HDL 39 (L) 06/05/2020   LDLCALC 110 (H) 06/05/2020   TRIG 103 06/05/2020   CHOLHDL 4.3 06/05/2020   Kylie Torres states that she has been taking her Crestor approximately every other day and has been tolerating it well but just dislikes having to take so many medications daily.  She denies any myalgias or muscle weakness.  Assessment/plan: Lipid panel was obtained at this visit and shows improvement of LDL from 1 43 to 110.  ASCVD risk score remains elevated at approximately 31%.  - Continue rosuvastatin 20 mg daily - Encouraged patient to start taking medication daily - Lipid panel repeat in the next 3 to 6 months

## 2020-06-06 NOTE — Assessment & Plan Note (Signed)
Ms. Chopra states that she has not been taking her vitamin D supplementation any longer, with her last dose several months ago.    Assessment/plan: Vitamin D levels evaluated at this visit and show improvement of 20.  Will discussed with patient to resume daily vitamin D, approximately 2000 mcg/day.

## 2020-06-06 NOTE — Progress Notes (Addendum)
   CC: HTN, HLD, hand pain, early satiety  HPI:  [name] is a 75 y.o. with a PMHx as listed below who presents to the clinic for HTN, HLD, hand pain, early satiety.   Please see the Encounters tab for problem-based Assessment & Plan regarding status of patient's acute and chronic conditions.  Past Medical History:  Diagnosis Date  . Acute pain of left shoulder 07/18/2019  . Alcohol abuse   . Blood transfusion without reported diagnosis   . Cirrhosis ([place])   . Gastric ulcer   . Hypertension   . Hypertensive retinopathy    OU   Review of Systems: Review of Systems  Constitutional: Positive for weight loss. Negative for chills and fever.  Respiratory: Negative for cough and shortness of breath.   Cardiovascular: Negative for chest pain and palpitations.  Gastrointestinal: Positive for abdominal pain, constipation (occasionally) and nausea. Negative for blood in stool, diarrhea, melena and vomiting.       + early satiety  Genitourinary: Negative for dysuria, flank pain, frequency, hematuria and urgency.  Musculoskeletal: Negative for falls.       + hand pain (bilateral)  Skin: Negative for itching and rash.  Neurological: Negative for dizziness and headaches.   Physical Exam:  Vitals:   06/05/20 1351 06/05/20 1508  BP: (!) 157/82 (!) 171/77  Pulse: 79 64  Temp: 98.8 F (37.1 C)   TempSrc: Oral   SpO2: 100%   Weight: 130 lb 11.2 oz (59.3 kg)   Height: 5\' 2"  (1.575 m)    Physical Exam Vitals and nursing note reviewed.  Constitutional:      General: She is not in acute distress.    Appearance: She is normal weight.  Cardiovascular:     Rate and Rhythm: Normal rate and regular rhythm.     Heart sounds: No murmur heard. No gallop.   Pulmonary:     Effort: Pulmonary effort is normal. No respiratory distress.     Breath sounds: Normal breath sounds. No wheezing or rales.  Abdominal:     General: Bowel sounds are decreased.     Palpations: Abdomen is soft.  There is no hepatomegaly or mass.     Tenderness: There is abdominal tenderness in the epigastric area and left upper quadrant. There is no guarding.     Hernia: No hernia is present.  Musculoskeletal:     Right hand: Bony tenderness (CMC point tenderness on the palmar aspect, less so than left hand. No other bony tenderness) present. No swelling, deformity or lacerations. Normal range of motion.     Left hand: Bony tenderness (CMC point tenderness on the palmar aspect. No other bony tenderness) present. No swelling, deformity or lacerations. Normal range of motion.     Right lower leg: No edema.     Left lower leg: No edema.  Skin:    General: Skin is warm and dry.  Neurological:     General: No focal deficit present.     Mental Status: She is alert and oriented to person, place, and time. Mental status is at baseline.  Psychiatric:        Mood and Affect: Mood normal.        Behavior: Behavior normal.        Thought Content: Thought content normal.        Judgment: Judgment normal.    Assessment & Plan:   See Encounters Tab for problem based charting.  Patient discussed with Dr. Evette Doffing

## 2020-06-06 NOTE — Assessment & Plan Note (Signed)
Kylie Torres states that for the past 3-4 weeks, she has begun to experience early satiety and difficulty finishing her meals.  She states that at this time she can eat only approximately half of the amount she used to be able to eat.  She describes her abdomen as feeling "sick" but denies any gross pain.  She denies any heartburn, sour taste in her mouth in the morning.  She denies any bowel movement changes but notes that every other week or so she does have some constipation.  She denies any melena, hematochezia.  She has been feeling nauseous but denies vomiting.  She is very upset by this new symptom onset given that she is concerned about losing weight.  She denies fever, chills, recent illnesses, decreased energy.  Assessment/plan: New onset early satiety is certainly concerning given patient's age.  Physical examination unremarkable.  She did have some mild epigastric bloating, but patient states this is unchanged from years prior.  Per chart review, patient does have a history of gastric ulcers with EGD in 2018 showing nonbleeding erosions in the gastric body and duodenum.  CBC was obtained and does not show any acute decline at this time.  TSH was evaluated and negative for thyroid dysfunction.   CMP was obtained, does not show any liver abnormalities given patient's history of alcohol abuse.  - CT abdomen/pelvis with contrast has been ordered.  If results are negative, will consider referral to GI.

## 2020-06-06 NOTE — Assessment & Plan Note (Signed)
[name] endorses several month history of bilateral hand pain that seems to originate from the base of the bilateral thumb region. Pain radiates to the rest of her palms. No pain with ROM today or diminished ROM. Alleviating factors include NSAIDS; requires it 1-2 times per week. [name] is quite upset about this hand pain, as when it starts, it is very painful.   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 No joint effusion or erythema to suggest infection or inflammation. Will start work up with xray to evaluate for bony abnormalities and supportive care.   - Bilateral hand xrays - Voltaren gel or Icy-hot

## 2020-06-07 NOTE — Progress Notes (Signed)
Internal Medicine Clinic Attending  Case discussed with Dr. Charleen Kirks  At the time of the visit.  We reviewed the resident's history and exam and pertinent patient test results.  I agree with the assessment, diagnosis, and plan of care documented in the resident's note.   Unintentional weight loss in a person with advanced age, associated with abdominal symptoms like early satiety. At risk for a GI/GU malignancy. Will image with CT abdomen/pelvis, but will have to settle for non-contrast study right now due to a national shortage of IV contrast materials.

## 2020-06-11 NOTE — Addendum Note (Signed)
Addended by: Jose Persia on: 06/11/2020 02:13 PM   Modules accepted: Orders

## 2020-06-19 ENCOUNTER — Other Ambulatory Visit: Payer: Self-pay

## 2020-06-19 DIAGNOSIS — M810 Age-related osteoporosis without current pathological fracture: Secondary | ICD-10-CM

## 2020-06-19 MED ORDER — ALENDRONATE SODIUM 70 MG PO TABS: 70.0000 mg | ORAL_TABLET | ORAL | 5 refills | Status: DC

## 2020-06-20 ENCOUNTER — Encounter: Payer: Self-pay | Admitting: *Deleted

## 2020-07-02 ENCOUNTER — Ambulatory Visit (HOSPITAL_COMMUNITY): Admission: RE | Admit: 2020-07-02 | Payer: Medicare Other | Source: Ambulatory Visit

## 2020-10-02 ENCOUNTER — Other Ambulatory Visit: Payer: Self-pay | Admitting: Internal Medicine

## 2020-10-02 DIAGNOSIS — Z1231 Encounter for screening mammogram for malignant neoplasm of breast: Secondary | ICD-10-CM

## 2020-10-10 ENCOUNTER — Ambulatory Visit (INDEPENDENT_AMBULATORY_CARE_PROVIDER_SITE_OTHER): Payer: Medicare Other | Admitting: Internal Medicine

## 2020-10-10 ENCOUNTER — Encounter: Payer: Self-pay | Admitting: Internal Medicine

## 2020-10-10 ENCOUNTER — Other Ambulatory Visit: Payer: Self-pay

## 2020-10-10 ENCOUNTER — Other Ambulatory Visit: Payer: Self-pay | Admitting: Internal Medicine

## 2020-10-10 DIAGNOSIS — Z0000 Encounter for general adult medical examination without abnormal findings: Secondary | ICD-10-CM

## 2020-10-10 DIAGNOSIS — R1013 Epigastric pain: Secondary | ICD-10-CM

## 2020-10-10 DIAGNOSIS — I10 Essential (primary) hypertension: Secondary | ICD-10-CM

## 2020-10-10 DIAGNOSIS — R109 Unspecified abdominal pain: Secondary | ICD-10-CM | POA: Insufficient documentation

## 2020-10-10 DIAGNOSIS — R011 Cardiac murmur, unspecified: Secondary | ICD-10-CM

## 2020-10-10 DIAGNOSIS — Z9114 Patient's other noncompliance with medication regimen: Secondary | ICD-10-CM | POA: Diagnosis not present

## 2020-10-10 DIAGNOSIS — Z91148 Patient's other noncompliance with medication regimen for other reason: Secondary | ICD-10-CM | POA: Insufficient documentation

## 2020-10-10 MED ORDER — PANTOPRAZOLE SODIUM 40 MG PO TBEC: 40.0000 mg | DELAYED_RELEASE_TABLET | Freq: Every day | ORAL | 0 refills | Status: DC

## 2020-10-10 MED ORDER — LOSARTAN POTASSIUM 25 MG PO TABS: 25.0000 mg | ORAL_TABLET | Freq: Every day | ORAL | 1 refills | Status: DC

## 2020-10-10 MED ORDER — ROSUVASTATIN CALCIUM 20 MG PO TABS: 20.0000 mg | ORAL_TABLET | Freq: Every day | ORAL | 5 refills | Status: DC

## 2020-10-10 MED ORDER — DILTIAZEM HCL ER COATED BEADS 240 MG PO CP24: 240.0000 mg | ORAL_CAPSULE | Freq: Every day | ORAL | 3 refills | Status: DC

## 2020-10-10 NOTE — Progress Notes (Signed)
   CC: check up  HPI:Kylie Torres is a 75 y.o. female who presents for evaluation of check up. Please see individual problem based A/P for details.  Please see encounters tab for problem-based charting.  Problem List Items Addressed This Visit       Cardiovascular and Mediastinum   HTN (hypertension)    BP 160's today, pt reports running high. Has not checked BP's at home recently though. Not taken her medication for past several months because she doesn't like taking pills. She continues to smoke. Denies HA, CP, SOB.  Restart medications. Diltiazem and losartan      Relevant Medications   losartan (COZAAR) 25 MG tablet   diltiazem (CARDIZEM CD) 240 MG 24 hr capsule   rosuvastatin (CRESTOR) 20 MG tablet     Other   Health care maintenance    Medications refilled and flu shot administered today.      Heart murmur    Systolic murmer present since childhood.  Since this has been present for many years, will not workup further at this point.      Abdominal pain    She is nontender to palpation, and has positive bowel sounds.  Will treat as though this is gastroesophageal reflux disease for now given that pain is felt in epigastric region radiating up into chest and improves with antacid use.  She does have a history of nonbleeding erosions noted on EGD in 2018.  If pain does not improve with PPI, can refer out to GI for further evaluation.      Nonadherence to medication    Reporting that she cannot take her medications because she does not like pills.  No other barriers to medical care such as language barrier, financial, or otherwise.  Discussed with patient the importance of long-term blood pressure control to avoid complications including stroke heart attack kidney disease and peripheral vascular disease.  Patient voiced understanding states that she will start taking her medications again.        Depression, PHQ-9: Based on the patients  [place]  Visit from 06/05/2020 in [place]  PHQ-9 Total Score 3      score we have 3.  Past Medical History:  Diagnosis Date   Acute pain of left shoulder 07/18/2019   Alcohol abuse    Blood transfusion without reported diagnosis    Cirrhosis ([place])    Gastric ulcer    Hypertension    Hypertensive retinopathy    OU   Review of Systems:   Review of Systems  Constitutional: Negative.   HENT: Negative.    Respiratory: Negative.    Cardiovascular: Negative.   Gastrointestinal: Negative.   Musculoskeletal: Negative.   Neurological: Negative.   Psychiatric/Behavioral: Negative.      Physical Exam: Vitals:   10/10/20 1319  BP: (!) 167/83  Pulse: 77  Temp: 98.5 F (36.9 C)  TempSrc: Oral  SpO2: 96%  Weight: 128 lb (58.1 kg)  Height: 5' 2.5" (1.588 m)     General: Alert and oriented no acute distress HEENT: Conjunctiva nl , antiicteric sclerae, moist mucous membranes, no exudate or erythema Cardiovascular: Normal rate, regular rhythm.  Systolic murmur Pulmonary : Equal breath sounds, No wheezes, rales, or rhonchi Abdominal: soft, nontender,  bowel sounds present Ext: No edema in lower extremities, no tenderness to palpation of lower extremities.   Assessment & Plan:   See Encounters Tab for problem based charting.  Patient seen with Dr. Jimmye Norman

## 2020-10-10 NOTE — Assessment & Plan Note (Signed)
Medications refilled and flu shot administered today.

## 2020-10-10 NOTE — Assessment & Plan Note (Signed)
She is nontender to palpation, and has positive bowel sounds.  Will treat as though this is gastroesophageal reflux disease for now given that pain is felt in epigastric region radiating up into chest and improves with antacid use.  She does have a history of nonbleeding erosions noted on EGD in 2018.  If pain does not improve with PPI, can refer out to GI for further evaluation.

## 2020-10-10 NOTE — Assessment & Plan Note (Signed)
Reporting that she cannot take her medications because she does not like pills.  No other barriers to medical care such as language barrier, financial, or otherwise.  Discussed with patient the importance of long-term blood pressure control to avoid complications including stroke heart attack kidney disease and peripheral vascular disease.  Patient voiced understanding states that she will start taking her medications again.

## 2020-10-10 NOTE — Patient Instructions (Addendum)
Dear Mrs. Hipke,  Thank you for trusting [place] with your care today.  Today we discussed your high blood pressure and abdominal pain.  For your high blood pressure, please restart taking your medications. It will be very important to control your blood pressure to prevent future complications like strokes, heart attacks, and kidney disease.  For your abdominal pain, please avoid eating large amounts of fried foods. Avoiding spicy foods and coffee can also help. I have reordered your stomach acid medication, pantoprazole, to help with the abdominal pain as well. Please take this as needed. If this does not help, we may need to have you see the stomach doctors.   I have refilled the medications you requested.  Please follow up in 6 months.

## 2020-10-10 NOTE — Assessment & Plan Note (Signed)
Systolic murmer present since childhood.  Since this has been present for many years, will not workup further at this point.

## 2020-10-10 NOTE — Assessment & Plan Note (Signed)
BP 160's today, pt reports running high. Has not checked BP's at home recently though. Not taken her medication for past several months because she doesn't like taking pills. She continues to smoke. Denies HA, CP, SOB.  Restart medications. Diltiazem and losartan

## 2020-10-11 ENCOUNTER — Ambulatory Visit
Admission: RE | Admit: 2020-10-11 | Discharge: 2020-10-11 | Disposition: A | Discharge status: Home / Self Care | Payer: Medicare Other | Source: Ambulatory Visit | Attending: Internal Medicine | Admitting: Internal Medicine

## 2020-10-11 DIAGNOSIS — Z1231 Encounter for screening mammogram for malignant neoplasm of breast: Secondary | ICD-10-CM

## 2020-10-14 NOTE — Progress Notes (Addendum)
Internal Medicine Clinic Attending  I saw and evaluated the patient.  I personally confirmed the key portions of the history and exam documented by Dr. Elliot Gurney and I reviewed pertinent patient test results.  The assessment, diagnosis, and plan were formulated together and I agree with the documentation in the resident's note. Systolic murmur is non radiating from the RUSB.  Auscultation during squat to increase preload does not diminish the intensity; LV outlet obstruction is not therefore likely (tested as she has hx of LVH).  Recent echo identified no AV abnormalities.  Monitor.

## 2021-04-02 ENCOUNTER — Encounter: Payer: Self-pay | Admitting: Gastroenterology

## 2021-04-06 ENCOUNTER — Other Ambulatory Visit: Payer: Self-pay | Admitting: Internal Medicine

## 2021-04-15 ENCOUNTER — Other Ambulatory Visit: Payer: Self-pay

## 2021-04-15 ENCOUNTER — Ambulatory Visit (AMBULATORY_SURGERY_CENTER): Payer: Medicare Other | Admitting: *Deleted

## 2021-04-15 VITALS — Ht 62.5 in | Wt 123.0 lb

## 2021-04-15 DIAGNOSIS — Z86010 Personal history of colonic polyps: Secondary | ICD-10-CM

## 2021-04-15 MED ORDER — NA SULFATE-K SULFATE-MG SULF 17.5-3.13-1.6 GM/177ML PO SOLN
2.0000 | Freq: Once | ORAL | 0 refills | Status: AC
Start: 2021-04-15 — End: 2021-04-15

## 2021-04-15 NOTE — Progress Notes (Signed)

## 2021-04-21 ENCOUNTER — Other Ambulatory Visit: Payer: Self-pay | Admitting: Internal Medicine

## 2021-04-26 ENCOUNTER — Other Ambulatory Visit: Payer: Self-pay | Admitting: Internal Medicine

## 2021-04-29 ENCOUNTER — Ambulatory Visit (AMBULATORY_SURGERY_CENTER): Payer: Medicare Other | Admitting: Gastroenterology

## 2021-04-29 ENCOUNTER — Encounter: Payer: Self-pay | Admitting: Gastroenterology

## 2021-04-29 VITALS — BP 141/80 | HR 81 | Temp 96.9°F | Resp 15 | Ht 62.5 in | Wt 128.0 lb

## 2021-04-29 DIAGNOSIS — Z86010 Personal history of colonic polyps: Secondary | ICD-10-CM

## 2021-04-29 HISTORY — PX: COLONOSCOPY: SHX174

## 2021-04-29 MED ORDER — SODIUM CHLORIDE 0.9 % IV SOLN: 500.0000 mL | Freq: Once | INTRAVENOUS | Status: DC

## 2021-04-29 NOTE — Progress Notes (Signed)
Pt's states no medical or surgical changes since previsit or office visit.  Vitals DT 

## 2021-04-29 NOTE — Patient Instructions (Signed)
YOU HAD AN ENDOSCOPIC PROCEDURE TODAY AT THE [facility]:   Refer to the procedure report that was given to you for any specific questions about what was found during the examination.  If the procedure report does not answer your questions, please call your gastroenterologist to clarify.  If you requested that your care partner not be given the details of your procedure findings, then the procedure report has been included in a sealed envelope for you to review at your convenience later.  YOU SHOULD EXPECT: Some feelings of bloating in the abdomen. Passage of more gas than usual.  Walking can help get rid of the air that was put into your GI tract during the procedure and reduce the bloating. If you had a lower endoscopy (such as a colonoscopy or flexible sigmoidoscopy) you may notice spotting of blood in your stool or on the toilet paper. If you underwent a bowel prep for your procedure, you may not have a normal bowel movement for a few days.  Please Note:  You might notice some irritation and congestion in your nose or some drainage.  This is from the oxygen used during your procedure.  There is no need for concern and it should clear up in a day or so.  SYMPTOMS TO REPORT IMMEDIATELY:   Following lower endoscopy (colonoscopy or flexible sigmoidoscopy):  Excessive amounts of blood in the stool  Significant tenderness or worsening of abdominal pains  Swelling of the abdomen that is new, acute  Fever of 100F or higher  For urgent or emergent issues, a gastroenterologist can be reached at any hour by calling [phone]. Do not use MyChart messaging for urgent concerns.    DIET:  We do recommend a small meal at first, but then you may proceed to your regular diet.  Drink plenty of fluids but you should avoid alcoholic beverages for 24 hours.  ACTIVITY:  You should plan to take it easy for the rest of today and you should NOT DRIVE or use heavy machinery until tomorrow (because  of the sedation medicines used during the test).    FOLLOW UP: Our staff will call the number listed on your records 48-72 hours following your procedure to check on you and address any questions or concerns that you may have regarding the information given to you following your procedure. If we do not reach you, we will leave a message.  We will attempt to reach you two times.  During this call, we will ask if you have developed any symptoms of COVID 19. If you develop any symptoms (ie: fever, flu-like symptoms, shortness of breath, cough etc.) before then, please call [phone].  If you test positive for Covid 19 in the 2 weeks post procedure, please call and report this information to us.    If any biopsies were taken you will be contacted by phone or by letter within the next 1-3 weeks.  Please call us at [phone] if you have not heard about the biopsies in 3 weeks.    SIGNATURES/CONFIDENTIALITY: You and/or your care partner have signed paperwork which will be entered into your electronic medical record.  These signatures attest to the fact that that the information above on your After Visit Summary has been reviewed and is understood.  Full responsibility of the confidentiality of this discharge information lies with you and/or your care-partner. 

## 2021-04-29 NOTE — Progress Notes (Signed)
A and O x3. Report to RN. Tolerated MAC anesthesia well.  ?

## 2021-04-29 NOTE — Progress Notes (Signed)
? ?History & Physical ? ?Primary Care Physician:  Delene Ruffini, MD ?Primary Gastroenterologist: Lucio Edward, MD ? ?CHIEF COMPLAINT:  Personal history of colon polyps  ? ?HPI: Kylie Torres is a 76 y.o. female with a personal history of adenomatous colon polyps for surveillance colonoscopy. ? ? ?Past Medical History:  ?Diagnosis Date  ? Acute pain of left shoulder 07/18/2019  ? Alcohol abuse   ? Blood transfusion without reported diagnosis   ? Cirrhosis ([place])   ? Gastric ulcer   ? Heart murmur   ? Hypertension   ? Hypertensive retinopathy   ? OU  ? ? ?Past Surgical History:  ?Procedure Laterality Date  ? ABDOMINAL HYSTERECTOMY    ? BREAST EXCISIONAL BIOPSY Left 1971  ? benign  ? CATARACT EXTRACTION Bilateral   ? Dr. Katy Fitch  ? COLONOSCOPY  04/29/2021  ? COLONOSCOPY W/ POLYPECTOMY  03/23/2016  ? ESOPHAGOGASTRODUODENOSCOPY (EGD) WITH PROPOFOL N/A 04/17/2015  ? Procedure: ESOPHAGOGASTRODUODENOSCOPY (EGD) WITH PROPOFOL;  Surgeon: Ladene Artist, MD;  Location: [facility];  Service: Endoscopy;  Laterality: N/A;  ? EYE SURGERY Bilateral   ? Cat Sx  ? OOPHORECTOMY    ? ? ?Prior to Admission medications   ?Medication Sig Start Date End Date Taking? Authorizing Provider  ?alendronate (FOSAMAX) 70 MG tablet Take 1 tablet (70 mg total) by mouth once a week. Take with a full glass of water on an empty stomach first thing in the morning. Do not take with other medications. 06/19/20  Yes Jose Persia, MD  ?diltiazem (CARDIZEM CD) 240 MG 24 hr capsule Take 1 capsule (240 mg total) by mouth daily. 10/10/20  Yes Delene Ruffini, MD  ?losartan (COZAAR) 25 MG tablet TAKE 1 TABLET(25 MG) BY MOUTH DAILY 04/08/21  Yes Delene Ruffini, MD  ?pantoprazole (PROTONIX) 40 MG tablet TAKE 1 TABLET(40 MG) BY MOUTH DAILY BEFORE BREAKFAST 10/14/20  Yes Christian, Rylee, MD  ?rosuvastatin (CRESTOR) 20 MG tablet TAKE 1 TABLET BY MOUTH DAILY 04/22/21  Yes Delene Ruffini, MD  ? ? ?Current Outpatient Medications  ?Medication Sig Dispense  Refill  ? alendronate (FOSAMAX) 70 MG tablet Take 1 tablet (70 mg total) by mouth once a week. Take with a full glass of water on an empty stomach first thing in the morning. Do not take with other medications. 4 tablet 5  ? diltiazem (CARDIZEM CD) 240 MG 24 hr capsule Take 1 capsule (240 mg total) by mouth daily. 90 capsule 3  ? losartan (COZAAR) 25 MG tablet TAKE 1 TABLET(25 MG) BY MOUTH DAILY 90 tablet 1  ? pantoprazole (PROTONIX) 40 MG tablet TAKE 1 TABLET(40 MG) BY MOUTH DAILY BEFORE BREAKFAST 90 tablet 1  ? rosuvastatin (CRESTOR) 20 MG tablet TAKE 1 TABLET BY MOUTH DAILY 90 tablet 3  ? ?Current Facility-Administered Medications  ?Medication Dose Route Frequency Provider Last Rate Last Admin  ? 0.9 %  sodium chloride infusion  500 mL Intravenous Once Ladene Artist, MD      ? ? ?Allergies as of 04/29/2021  ? (No Known Allergies)  ? ? ?Family History  ?Problem Relation Age of Onset  ? Breast cancer Mother 59  ? Cancer Mother   ? Heart attack Father   ? Hypertension Father   ? Hypertension Sister   ? Diabetes Sister   ? Heart attack Brother   ? Stomach cancer Maternal Aunt   ? Breast cancer Maternal Aunt   ?     ? age of onset  ? Colon cancer Neg Hx   ?  Esophageal cancer Neg Hx   ? Rectal cancer Neg Hx   ? ? ?Social History  ? ?Socioeconomic History  ? Marital status: Divorced  ?  Spouse name: Not on file  ? Number of children: 3  ? Years of education: Not on file  ? Highest education level: Not on file  ?Occupational History  ? Occupation: retired  ?Tobacco Use  ? Smoking status: Every Day  ?  Packs/day: 0.50  ?  Years: 0.03  ?  Pack years: 0.02  ?  Types: Cigarettes  ?  Start date: 01/27/2019  ? Smokeless tobacco: Never  ?Vaping Use  ? Vaping Use: Never used  ?Substance and Sexual Activity  ? Alcohol use: No  ?  Comment: quit drinking in 03/2015 - previously 1/5 gin every 2 days  ? Drug use: No  ? Sexual activity: Not on file  ?Other Topics Concern  ? Not on file  ?Social History Narrative  ? Current Social  History 05/18/2019    ?   ? Patient lives alone in an apartment which is 2 stories. There are steps with hand rails which the patient uses.   ?   ? Patient's method of transportation is via her daughter.  ?   ? The highest level of education was high school diploma.  ?   ? The patient currently retired.  ?   ? Identified important Relationships are with her daughter, a good friend who calls her daily, and a neighbor.  ?   ? Pets : No  ?    ? Interests / Fun: Watching TV and reading about various subjects   ?   ? Current Stressors: None   ?   ? Religious / Personal Beliefs: Holliness   ?   ? Other: Will I get to see Dr. Frederico Hamman again before she leaves?  Pt informed she has an upcoming appointment with Dr. Frederico Hamman on 07/03/2019 @ 1:15 and this would be included in her AVS.  ? SChaplin, RN,BSN  ?    ? ?Social Determinants of Health  ? ?Financial Resource Strain: Not on file  ?Food Insecurity: Not on file  ?Transportation Needs: Not on file  ?Physical Activity: Not on file  ?Stress: Not on file  ?Social Connections: Not on file  ?Intimate Partner Violence: Not on file  ? ? ?Review of Systems: ? ?All systems reviewed an negative except where noted in HPI. ? ?Gen: Denies any fever, chills, sweats, anorexia, fatigue, weakness, malaise, weight loss, and sleep disorder ?CV: Denies chest pain, angina, palpitations, syncope, orthopnea, PND, peripheral edema, and claudication. ?Resp: Denies dyspnea at rest, dyspnea with exercise, cough, sputum, wheezing, coughing up blood, and pleurisy. ?GI: Denies vomiting blood, jaundice, and fecal incontinence.   Denies dysphagia or odynophagia. ?GU : Denies urinary burning, blood in urine, urinary frequency, urinary hesitancy, nocturnal urination, and urinary incontinence. ?MS: Denies joint pain, limitation of movement, and swelling, stiffness, low back pain, extremity pain. Denies muscle weakness, cramps, atrophy.  ?Derm: Denies rash, itching, dry skin, hives, moles, warts, or unhealing ulcers.   ?Psych: Denies depression, anxiety, memory loss, suicidal ideation, hallucinations, paranoia, and confusion. ?Heme: Denies bruising, bleeding, and enlarged lymph nodes. ?Neuro:  Denies any headaches, dizziness, paresthesias. ?Endo:  Denies any problems with DM, thyroid, adrenal function. ? ? ?Physical Exam: ?General:  Alert, well-developed, in NAD ?Head:  Normocephalic and atraumatic. ?Eyes:  Sclera clear, no icterus.   Conjunctiva pink. ?Ears:  Normal auditory acuity. ?Mouth:  No deformity or lesions.  ?  Neck:  Supple; no masses . ?Lungs:  Clear throughout to auscultation.   No wheezes, crackles, or rhonchi. No acute distress. ?Heart:  Regular rate and rhythm; no murmurs. ?Abdomen:  Soft, nondistended, nontender. No masses, hepatomegaly. No obvious masses.  Normal bowel .    ?Rectal:  Deferred   ?Msk:  Symmetrical without gross deformities.Marland Kitchen ?Pulses:  Normal pulses noted. ?Extremities:  Without edema. ?Neurologic:  Alert and  oriented x4;  grossly normal neurologically. ?Skin:  Intact without significant lesions or rashes. ?Cervical Nodes:  No significant cervical adenopathy. ?Psych:  Alert and cooperative. Normal mood and affect. ? ?Impression / Plan:  ? ?Personal history of adenomatous colon polyps for surveillance colonoscopy. ? ?Kylie Torres T. Fuller Plan  04/29/2021, 11:23 AM ?See Shea Evans, [facility], to contact our on call provider ? ? ?  ?

## 2021-04-29 NOTE — Op Note (Signed)
[place] ?Patient Name: Kylie Torres ?Procedure Date: 04/29/2021 11:27 AM ?MRN: [id number] ?Endoscopist: Ladene Artist , MD ?Age: 76 ?Referring MD:  ?Date of Birth: 1945/02/20 ?Gender: Female ?Account #: [id number] ?Procedure:                Colonoscopy ?Indications:              Surveillance: Personal history of adenomatous  ?                          polyps on last colonoscopy 5 years ago ?Medicines:                Monitored Anesthesia Care ?Procedure:                Pre-Anesthesia Assessment: ?                          - Prior to the procedure, a History and Physical  ?                          was performed, and patient medications and  ?                          allergies were reviewed. The patient's tolerance of  ?                          previous anesthesia was also reviewed. The risks  ?                          and benefits of the procedure and the sedation  ?                          options and risks were discussed with the patient.  ?                          All questions were answered, and informed consent  ?                          was obtained. Prior Anticoagulants: The patient has  ?                          taken no previous anticoagulant or antiplatelet  ?                          agents. ASA Grade Assessment: II - A patient with  ?                          mild systemic disease. After reviewing the risks  ?                          and benefits, the patient was deemed in  ?                          satisfactory condition to undergo the procedure. ?  After obtaining informed consent, the colonoscope  ?                          was passed under direct vision. Throughout the  ?                          procedure, the patient's blood pressure, pulse, and  ?                          oxygen saturations were monitored continuously. The  ?                          Olympus PCF-H190DL (EX#[id number]) Colonoscope was  ?                          introduced through the anus  and advanced to the the  ?                          cecum, identified by appendiceal orifice and  ?                          ileocecal valve. The ileocecal valve, appendiceal  ?                          orifice, and rectum were photographed. The quality  ?                          of the bowel preparation was good. The colonoscopy  ?                          was performed without difficulty. The patient  ?                          tolerated the procedure well. ?Scope In: 11:30:25 AM ?Scope Out: 11:46:25 AM ?Scope Withdrawal Time: 0 hours 10 minutes 35 seconds  ?Total Procedure Duration: 0 hours 16 minutes 0 seconds  ?Findings:                 The perianal and digital rectal examinations were  ?                          normal. ?                          Multiple medium-mouthed diverticula were found in  ?                          the entire colon. There was no evidence of  ?                          diverticular bleeding. ?                          Internal hemorrhoids were found during  ?  retroflexion. The hemorrhoids were small and Grade  ?                          I (internal hemorrhoids that do not prolapse). ?                          The exam was otherwise without abnormality on  ?                          direct and retroflexion views. ?Complications:            No immediate complications. Estimated blood loss:  ?                          None. ?Estimated Blood Loss:     Estimated blood loss: none. ?Impression:               - Moderate diverticulosis in the entire examined  ?                          colon. ?                          - Internal hemorrhoids. ?                          - The examination was otherwise normal on direct  ?                          and retroflexion views. ?                          - No specimens collected. ?Recommendation:           - Patient has a contact number available for  ?                          emergencies. The signs and symptoms of potential  ?                           delayed complications were discussed with the  ?                          patient. Return to normal activities tomorrow.  ?                          Written discharge instructions were provided to the  ?                          patient. ?                          - High fiber diet. ?                          - Continue present medications. ?                          -  No repeat colonoscopy due to age and the absence  ?                          of colonic polyps. ?Ladene Artist, MD ?04/29/2021 11:53:41 AM ?This report has been signed electronically. ?

## 2021-04-30 ENCOUNTER — Other Ambulatory Visit: Payer: Self-pay | Admitting: Internal Medicine

## 2021-05-01 ENCOUNTER — Telehealth: Payer: Self-pay

## 2021-05-01 ENCOUNTER — Telehealth: Payer: Self-pay | Admitting: *Deleted

## 2021-05-01 NOTE — Telephone Encounter (Signed)
Follow up call placed, no answer. SChaplin, RN,BSN  

## 2021-05-01 NOTE — Telephone Encounter (Signed)
?  Follow up Call- ? ? ?  04/29/2021  ? 10:32 AM  ?Call back number  ?Post procedure Call Back phone  # [phone]  ?Permission to leave phone message Yes  ?  ? ?Patient questions: ? ?Do you have a fever, pain , or abdominal swelling? No. ?Pain Score  0 * ? ?Have you tolerated food without any problems? Yes.   ? ?Have you been able to return to your normal activities? Yes.   ? ?Do you have any questions about your discharge instructions: ?Diet   No. ?Medications  No. ?Follow up visit  No. ? ?Do you have questions or concerns about your Care? No. ? ?Actions: ?* If pain score is 4 or above: ?No action needed, pain <4. ? ? ? ?

## 2021-06-05 ENCOUNTER — Encounter: Payer: Self-pay | Admitting: Internal Medicine

## 2021-06-05 ENCOUNTER — Ambulatory Visit (INDEPENDENT_AMBULATORY_CARE_PROVIDER_SITE_OTHER): Payer: Medicare Other | Admitting: Internal Medicine

## 2021-06-05 VITALS — BP 166/65 | HR 83 | Temp 98.1°F | Wt 131.2 lb

## 2021-06-05 DIAGNOSIS — Z91148 Patient's other noncompliance with medication regimen for other reason: Secondary | ICD-10-CM | POA: Diagnosis not present

## 2021-06-05 DIAGNOSIS — I739 Peripheral vascular disease, unspecified: Secondary | ICD-10-CM | POA: Diagnosis not present

## 2021-06-05 DIAGNOSIS — F17210 Nicotine dependence, cigarettes, uncomplicated: Secondary | ICD-10-CM

## 2021-06-05 DIAGNOSIS — F17200 Nicotine dependence, unspecified, uncomplicated: Secondary | ICD-10-CM

## 2021-06-05 DIAGNOSIS — E782 Mixed hyperlipidemia: Secondary | ICD-10-CM

## 2021-06-05 DIAGNOSIS — M810 Age-related osteoporosis without current pathological fracture: Secondary | ICD-10-CM

## 2021-06-05 DIAGNOSIS — I10 Essential (primary) hypertension: Secondary | ICD-10-CM

## 2021-06-05 MED ORDER — LOSARTAN POTASSIUM 50 MG PO TABS: 50.0000 mg | ORAL_TABLET | Freq: Every day | ORAL | 1 refills | Status: DC

## 2021-06-05 MED ORDER — ASPIRIN EC 81 MG PO TBEC: 81.0000 mg | DELAYED_RELEASE_TABLET | Freq: Every day | ORAL | 2 refills | Status: DC

## 2021-06-05 NOTE — Progress Notes (Signed)
? ?Office Visit ? ? Patient ID: Kylie Torres, female    DOB: 1945-08-26, 76 y.o.   MRN: [id number]   PCP: Delene Ruffini, MD  ? ?Subjective:  ?CC: Hyperlipidemia, Hypertension, and PAD  ? ?Kylie Torres is a 76 y.o. year old female who presents for the above medical condition(s). Please refer to problem based charting for assessment and plan. ? ?Objective:  ? ?BP (!) 166/65 (BP Location: Left Arm, Patient Position: Sitting, Cuff Size: Normal)   Pulse 83   Temp 98.1 ?F (36.7 ?C) (Oral)   Wt 131 lb 3.2 oz (59.5 kg)   SpO2 99%   BMI 23.61 kg/m?  ? ?General: well appearing, no distress ?CV: RRR. 4/6 systolic murmur. No LE edema. Feet are warm and without wounds bilaterally. DP/PT pulses not palpable. DP barely appreciable on doppler bilaterally. PT present bilaterally with doppler.  ?Pulm: lungs clear throughout ?Assessment & Plan:  ? ? ? HTN (hypertension) - Primary  ?  Current medications: Diltiazem 240 mg 24-hour capsule daily, losartan 25 mg daily ?Denies adverse medication effects.  She reports medication compliance although dispense report suggests only moderate with diltiazem. ?Blood pressure is above goal in the office today-166/65 ?She reports having a blood pressure cuff at home that she uses intermittently with her systolic blood pressures typically being in the 150s. ?- No renal or electrolyte derangements on BMP today ?- We will increase losartan to 50 mg.  Continue diltiazem at current dose ?- Follow-up in 2 to 4 weeks for blood pressure recheck ? ?  ?  ? PAD (peripheral artery disease) ([place])  ?  Relevant medications: Crestor 20 mg daily.  Note, no antiplatelet agents present on her med list and she confirms that she does not take aspirin. ?We had a lengthy discussion regarding her PAD today.  She is not experiencing leg pain at her visit today.  She reports experiencing claudication symptoms when walking from her front door to her mailbox, which resolves with rest.  She does not feel like  this is worsened significantly over the past 12 months although she does give me the impression that this is affecting her functional status. ?No lower extremity wounds present on exam today and her feet are warm.  Pedal pulses cannot be appreciable on palpation but were confirmed present on Doppler with her dorsalis pedis pulses being significantly more diminished bilaterally than her posterior tibials.  I also did obtain ABIs in our clinic today.  It was unable to be calculated on the left due to the degree of disease however was 0.7 on the right. ?LDL on labs today 78 ?I reviewed the pathophysiology, management and complications of peripheral artery disease.  It does not appear that she is been evaluated by vascular surgery in the past. ?- We will start by trying to maximize medical therapy with the addition of aspirin 81 mg daily and continuing Crestor at 20 mg.  I also reiterated the importance of walking and the absolute importance of smoking cessation (see details under separate problem) ?- I would consider reevaluating her symptoms in 6 months at which time, if her symptoms are worsening or continued to limit her functionally, would consider referral to vascular surgery ?- Reviewed indications to seek immediate evaluation with her for symptoms concerning for critical limb ischemia ? ?  ?  ? Osteoporosis  ?  Current medication: Alendronate 70 mg once weekly ?Denies adverse medication effects.  She knows to remain sitting up for at least 30 minutes  after taking. ?- Continue current management ?  ?  ?  ? Tobacco use disorder  ?  Currently smoking roughly a pack a day, sometimes a pack and a half.  Reports that she was doing better with this until the death of a family member a couple weeks ago.  7 minutes of today's visit were spent on smoking cessation counseling.  She reports that she has stopped smoking in the past although this was remote.  Reviewed available treatment tools including counseling, NRT, and  pharmacologic treatments.  She reports that she has not tried these in the past although she is in the Contemplative stage.  She is not ready to commit at this time due to continued grieving over the loss of her loved one. ?- We will continue to encourage cessation ?- Qualifies for LDCT for lung cancer screening- 30-pack-year smoking history with ongoing tobacco use.  Address at time of follow-up ? ?  ?  ? Hyperlipidemia  ?  Near goal on Crestor 20 mg.  LDL 78.  Continue current management.  Recheck lipids in 1 year.  Ideally, LDL goal will be under 70 given her severe peripheral disease ? ?  ?  ? Nonadherence to medication  ?  Seems to have improved since her last office visit based on dispense reports and her report ?  ?  ? ?Follow-up in 4 weeks for blood pressure recheck ?- Discuss lung cancer screening ?- Revisit smoking cessation ? ? ?Pt discussed with Dr. Evette Doffing ? ?Mitzi Hansen, MD ?Internal Medicine Resident PGY-3 ?[facility] ?06/06/2021 5:37 PM  ? s ?

## 2021-06-05 NOTE — Patient Instructions (Signed)
Ms.Tersea Michelene Heady, it was a pleasure seeing you today! ? ?Today we discussed: ? ?Hypertension: ?-Increase losartan to '50mg'$  (2 pills of your current prescription).  ?-Continue diltiazem ?-Check home blood pressures daily at home ?-Follow up in 1 month ? ?Peripheral artery disease:  ?-Start aspirin ?-Continue crestor ?-Continue to think about stopping smoking. When you are ready, we are more than happy to discuss this more. ? ? ? ? ?Follow-up: 1 month  ? ?Please make sure to arrive 15 minutes prior to your next appointment. If you arrive late, you may be asked to reschedule.  ? ?We look forward to seeing you next time. Please call our clinic at [phone] if you have any questions or concerns. The best time to call is Monday-Friday from 9am-4pm, but there is someone available 24/7. If after hours or the weekend, call the main hospital number and ask for the Internal Medicine Resident On-Call. If you need medication refills, please notify your pharmacy one week in advance and they will send [place] a request. ? ?Thank you for letting us take part in your care. Wishing you the best! ? ? ?

## 2021-06-06 ENCOUNTER — Encounter: Payer: Self-pay | Admitting: Internal Medicine

## 2021-06-06 LAB — BASIC METABOLIC PANEL
BUN/Creatinine Ratio: 17 (ref 12–28)
BUN: 17 mg/dL (ref 8–27)
CO2: 20 mmol/L (ref 20–29)
Calcium: 9.9 mg/dL (ref 8.7–10.3)
Chloride: 106 mmol/L (ref 96–106)
Creatinine, Ser: 0.98 mg/dL (ref 0.57–1.00)
Glucose: 115 mg/dL — ABNORMAL HIGH (ref 70–99)
Potassium: 4.3 mmol/L (ref 3.5–5.2)
Sodium: 142 mmol/L (ref 134–144)
eGFR: 61 mL/min/{1.73_m2} (ref >59)

## 2021-06-06 LAB — LIPID PANEL
Chol/HDL Ratio: 3.1 ratio (ref 0.0–4.4)
Cholesterol, Total: 142 mg/dL (ref 100–199)
HDL: 46 mg/dL (ref >39)
LDL Chol Calc (NIH): 78 mg/dL (ref 0–99)
Triglycerides: 98 mg/dL (ref 0–149)
VLDL Cholesterol Cal: 18 mg/dL (ref 5–40)

## 2021-06-06 NOTE — Assessment & Plan Note (Signed)
Seems to have improved since her last office visit based on dispense reports and her report ?

## 2021-06-06 NOTE — Assessment & Plan Note (Signed)
Relevant medications: Crestor 20 mg daily.  Note, no antiplatelet agents present on her med list and she confirms that she does not take aspirin. ?We had a lengthy discussion regarding her PAD today.  She is not experiencing leg pain at her visit today.  She reports experiencing claudication symptoms when walking from her front door to her mailbox, which resolves with rest.  She does not feel like this is worsened significantly over the past 12 months although she does give me the impression that this is affecting her functional status. ?No lower extremity wounds present on exam today and her feet are warm.  Pedal pulses cannot be appreciable on palpation but were confirmed present on Doppler with her dorsalis pedis pulses being significantly more diminished bilaterally than her posterior tibials.  I also did obtain ABIs in our clinic today.  It was unable to be calculated on the left due to the degree of disease however was 0.7 on the right. ?LDL on labs today 78 ?I reviewed the pathophysiology, management and complications of peripheral artery disease.  It does not appear that she is been evaluated by vascular surgery in the past. ?- We will start by trying to maximize medical therapy with the addition of aspirin 81 mg daily and continuing Crestor at 20 mg.  I also reiterated the importance of walking and the absolute importance of smoking cessation (see details under separate problem) ?- I would consider reevaluating her symptoms in 6 months at which time, if her symptoms are worsening or continued to limit her functionally, would consider referral to vascular surgery ?- Reviewed indications to seek immediate evaluation with her for symptoms concerning for critical limb ischemia ?

## 2021-06-06 NOTE — Assessment & Plan Note (Addendum)
Currently smoking roughly a pack a day, sometimes a pack and a half.  Reports that she was doing better with this until the death of a family member a couple weeks ago.  7 minutes of today's visit were spent on smoking cessation counseling.  She reports that she has stopped smoking in the past although this was remote.  Reviewed available treatment tools including counseling, NRT, and pharmacologic treatments.  She reports that she has not tried these in the past although she is in the Contemplative stage.  She is not ready to commit at this time due to continued grieving over the loss of her loved one. ?- We will continue to encourage cessation ?- Qualifies for LDCT for lung cancer screening- 30-pack-year smoking history with ongoing tobacco use.  Address at time of follow-up ?

## 2021-06-06 NOTE — Assessment & Plan Note (Signed)
Current medication: Alendronate 70 mg once weekly ?Denies adverse medication effects.  She knows to remain sitting up for at least 30 minutes after taking. ?- Continue current management ?

## 2021-06-06 NOTE — Assessment & Plan Note (Signed)
Current medications: Diltiazem 240 mg 24-hour capsule daily, losartan 25 mg daily ?Denies adverse medication effects.  She reports medication compliance although dispense report suggests only moderate with diltiazem. ?Blood pressure is above goal in the office today-166/65 ?She reports having a blood pressure cuff at home that she uses intermittently with her systolic blood pressures typically being in the 150s. ?- No renal or electrolyte derangements on BMP today ?- We will increase losartan to 50 mg.  Continue diltiazem at current dose ?- Follow-up in 2 to 4 weeks for blood pressure recheck ?

## 2021-06-06 NOTE — Assessment & Plan Note (Signed)
Near goal on Crestor 20 mg.  LDL 78.  Continue current management.  Recheck lipids in 1 year.  Ideally, LDL goal will be under 70 given her severe peripheral disease ?

## 2021-06-09 NOTE — Progress Notes (Signed)
Internal Medicine Clinic Attending  Case discussed with Dr. Christian  At the time of the visit.  We reviewed the resident's history and exam and pertinent patient test results.  I agree with the assessment, diagnosis, and plan of care documented in the resident's note.  

## 2021-06-30 ENCOUNTER — Other Ambulatory Visit: Payer: Self-pay | Admitting: Internal Medicine

## 2021-06-30 DIAGNOSIS — I739 Peripheral vascular disease, unspecified: Secondary | ICD-10-CM

## 2021-08-03 ENCOUNTER — Other Ambulatory Visit: Payer: Self-pay | Admitting: Internal Medicine

## 2021-08-03 DIAGNOSIS — I10 Essential (primary) hypertension: Secondary | ICD-10-CM

## 2021-08-06 NOTE — Telephone Encounter (Signed)
Please have patient follow up

## 2021-09-11 ENCOUNTER — Encounter: Payer: Self-pay | Admitting: Internal Medicine

## 2021-09-11 ENCOUNTER — Ambulatory Visit (INDEPENDENT_AMBULATORY_CARE_PROVIDER_SITE_OTHER): Payer: Medicare Other | Admitting: Internal Medicine

## 2021-09-11 VITALS — BP 169/71 | HR 63 | Temp 98.4°F | Ht 62.5 in | Wt 133.3 lb

## 2021-09-11 DIAGNOSIS — F17210 Nicotine dependence, cigarettes, uncomplicated: Secondary | ICD-10-CM | POA: Diagnosis not present

## 2021-09-11 DIAGNOSIS — I739 Peripheral vascular disease, unspecified: Secondary | ICD-10-CM | POA: Diagnosis not present

## 2021-09-11 DIAGNOSIS — I10 Essential (primary) hypertension: Secondary | ICD-10-CM

## 2021-09-11 DIAGNOSIS — E782 Mixed hyperlipidemia: Secondary | ICD-10-CM

## 2021-09-11 DIAGNOSIS — E559 Vitamin D deficiency, unspecified: Secondary | ICD-10-CM | POA: Diagnosis not present

## 2021-09-11 DIAGNOSIS — M810 Age-related osteoporosis without current pathological fracture: Secondary | ICD-10-CM

## 2021-09-11 MED ORDER — VITAMIN D 50 MCG (2000 UT) PO TABS: 2000.0000 [IU] | ORAL_TABLET | Freq: Every day | ORAL | 0 refills | Status: AC

## 2021-09-11 MED ORDER — ROSUVASTATIN CALCIUM 40 MG PO TABS: 40.0000 mg | ORAL_TABLET | Freq: Every day | ORAL | 0 refills | Status: DC

## 2021-09-11 MED ORDER — LOSARTAN POTASSIUM 50 MG PO TABS
50.0000 mg | ORAL_TABLET | Freq: Every day | ORAL | 2 refills | Status: DC
Start: 2021-09-11 — End: 2021-09-25

## 2021-09-11 NOTE — Patient Instructions (Addendum)
Ms.Fannie Michelene Heady, it was a pleasure seeing you today! You endorsed feeling well today. Below are some of the things we talked about this visit. We look forward to seeing you in the follow up appointment!  Today we discussed: For your blood pressure: I will send a medications to help lower your blood pressure called Losartan 50 mg qd. You were supposed to be taking this medication but you stated you have not received it.  We will order some testing to see how the blood flow is in your body. Continue cutting down on smoking.  I have ordered the following labs today:  Lab Orders  No laboratory test(s) ordered today      Referrals ordered today:   Referral Orders  No referral(s) requested today     I have ordered the following medication/changed the following medications:   Stop the following medications: Medications Discontinued During This Encounter  Medication Reason   rosuvastatin (CRESTOR) 20 MG tablet Reorder   losartan (COZAAR) 50 MG tablet Reorder     Start the following medications: Meds ordered this encounter  Medications   Cholecalciferol (VITAMIN D) 50 MCG (2000 UT) tablet    Sig: Take 1 tablet (2,000 Units total) by mouth daily.    Dispense:  30 tablet    Refill:  0   rosuvastatin (CRESTOR) 40 MG tablet    Sig: Take 1 tablet (40 mg total) by mouth daily.    Dispense:  90 tablet    Refill:  0    Requesting 1 year supply   losartan (COZAAR) 50 MG tablet    Sig: Take 1 tablet (50 mg total) by mouth daily.    Dispense:  30 tablet    Refill:  2     Follow-up: 2 weeks follow up.   Please make sure to arrive 15 minutes prior to your next appointment. If you arrive late, you may be asked to reschedule.   We look forward to seeing you next time. Please call our clinic at [phone] if you have any questions or concerns. The best time to call is Monday-Friday from 9am-4pm, but there is someone available 24/7. If after hours or the weekend, call the main hospital  number and ask for the Internal Medicine Resident On-Call. If you need medication refills, please notify your pharmacy one week in advance and they will send [place] a request.  Thank you for letting us take part in your care. Wishing you the best!  Thank you, Idamae Schuller, MD

## 2021-09-11 NOTE — Progress Notes (Signed)
CC: HTN follow up  HPI:  [name] is a 76 y.o. with medical history of HTN, alcohol use disorder,GERD and Osteoporosis coming in for a follow up from prior visit.    Please see problem-based list for further details, assessments, and plans.  Past Medical History:  Diagnosis Date   Acute pain of left shoulder 07/18/2019   Alcohol abuse    Blood transfusion without reported diagnosis    Cirrhosis ([place])    Gastric ulcer    Heart murmur    Hypertension    Hypertensive retinopathy    OU    Current Outpatient Medications (Endocrine & Metabolic):    alendronate (FOSAMAX) 70 MG tablet, Take 1 tablet (70 mg total) by mouth once a week. Take with a full glass of water on an empty stomach first thing in the morning. Do not take with other medications.  Current Outpatient Medications (Cardiovascular):    diltiazem (CARDIZEM CD) 240 MG 24 hr capsule, TAKE 1 CAPSULE BY MOUTH DAILY   losartan (COZAAR) 50 MG tablet, Take 1 tablet (50 mg total) by mouth daily.   rosuvastatin (CRESTOR) 20 MG tablet, TAKE 1 TABLET BY MOUTH DAILY   Current Outpatient Medications (Analgesics):    aspirin EC 81 MG tablet, Take 1 tablet (81 mg total) by mouth daily. Swallow whole.   Current Outpatient Medications (Other):    pantoprazole (PROTONIX) 40 MG tablet, TAKE 1 TABLET BY MOUTH DAILY  BEFORE BREAKFAST  Review of Systems:  Review of system negative unless stated in the problem list or HPI.    Physical Exam:  Vitals:   09/11/21 1310  BP: (!) 172/73  Pulse: 72  Temp: 98.4 F (36.9 C)  TempSrc: Oral  SpO2: 99%  Weight: 133 lb 4.8 oz (60.5 kg)  Height: 5' 2.5" (1.588 m)    Physical Exam General: NAD HENT: NCAT Lungs: CTAB, no wheeze, rhonchi or rales.  Cardiovascular: NSR, systolic murmur present, bilateral carotid bruits present. 2+ radial pulses present but nonpalpable pedal pulses.  Abdomen: No TTP, normal bowel sounds MSK: No asymmetry or muscle atrophy.  Skin: no lesions  noted on exposed skin Neuro: Alert and oriented x4. CN grossly intact Psych: Normal mood and normal affect   Assessment & Plan:   HTN (hypertension) Patient has hx of HTN that is uncontrolled. Her regimen was changed with Losartan increased to 50 mg and Diltiazem continued at 240 mg qd. She did not receive Losartan and is only taking Cardizem for her HTN. Patient does not check blood pressure regularly. She is willing to pick up the prescription and check her blood pressure regularly.  -Losartan 50 mg sent to local pharmacy, Continue Cardizem 240 mg qd -Follow up in 2 weeks with BP log  PAD (peripheral artery disease) ([place]) Patient has HLD with goal of secondary prevention given her significant PAD. Goal LDL is less than 70. She is on Crestor 20 mg but I am increasing this to Crestor 40 mg given degree of her PAD and LDL not at goal. She is not endorsing any symptoms. I discussed issue of blood flow in her body at length as patient stated she is not having any problems. She is agreeable to obtain LE doppler and Carotid doppler to evaluate degree of stenosis. She is also agreeable to see vascular surgery if needed.  -Increase Crestor to 40 mg qd -Follow up on ultrasound of BLE and carotids  -Follow up in 2 weeks.   Vitamin D deficiency Pt last vitamin  D level was low at 20 in 05/2020. She is currently not taking any supplementation. Will start pt on Vitamin D supplementation as she will be low given low result and no supplementation. Will check her Vitamin D level during follow up visit along with BMP. Her last bone density testing 2 years ago showed osteoporosis with T score of -3.0. Will discuss repeating this and re-starting fosamax as patient is currently not taking it.  -Start Vit D supplementation -Repeat Vit D level at follow up visit -Discuss repeat Dexa Scan at next visit.     Patient discussed with Dr. Garald Balding, MD

## 2021-09-13 NOTE — Assessment & Plan Note (Signed)
Pt last vitamin D level was low at 20 in 05/2020. She is currently not taking any supplementation. Will start pt on Vitamin D supplementation as she will be low given low result and no supplementation. Will check her Vitamin D level during follow up visit along with BMP. Her last bone density testing 2 years ago showed osteoporosis with T score of -3.0. Will discuss repeating this and re-starting fosamax as patient is currently not taking it.  -Start Vit D supplementation -Repeat Vit D level at follow up visit -Discuss repeat Dexa Scan at next visit.

## 2021-09-13 NOTE — Assessment & Plan Note (Signed)
Patient has HLD with goal of secondary prevention given her significant PAD. Goal LDL is less than 70. She is on Crestor 20 mg but I am increasing this to Crestor 40 mg given degree of her PAD and LDL not at goal. She is not endorsing any symptoms. I discussed issue of blood flow in her body at length as patient stated she is not having any problems. She is agreeable to obtain LE doppler and Carotid doppler to evaluate degree of stenosis. She is also agreeable to see vascular surgery if needed.  -Increase Crestor to 40 mg qd -Follow up on ultrasound of BLE and carotids  -Follow up in 2 weeks.

## 2021-09-13 NOTE — Assessment & Plan Note (Signed)
Patient has hx of HTN that is uncontrolled. Her regimen was changed with Losartan increased to 50 mg and Diltiazem continued at 240 mg qd. She did not receive Losartan and is only taking Cardizem for her HTN. Patient does not check blood pressure regularly. She is willing to pick up the prescription and check her blood pressure regularly.  -Losartan 50 mg sent to local pharmacy, Continue Cardizem 240 mg qd -Follow up in 2 weeks with BP log

## 2021-09-15 ENCOUNTER — Encounter: Payer: Medicare Other | Admitting: Student

## 2021-09-15 NOTE — Addendum Note (Signed)
Addended by: Randalyn Rhea on: 09/15/2021 08:54 AM   Modules accepted: Level of Service

## 2021-09-15 NOTE — Progress Notes (Signed)
Internal Medicine Clinic Attending  Case discussed with Dr. Khan  At the time of the visit.  We reviewed the resident's history and exam and pertinent patient test results.  I agree with the assessment, diagnosis, and plan of care documented in the resident's note.  

## 2021-09-24 NOTE — Progress Notes (Unsigned)
CC: HTN follow up and labs  HPI:  [name] is a 76 y.o. with medical history of HTN, HLD, GERD presenting to [facility] for HTN follow up and lab work.   Please see problem-based list for further details, assessments, and plans.  Past Medical History:  Diagnosis Date   Acute pain of left shoulder 07/18/2019   Alcohol abuse    Blood transfusion without reported diagnosis    Cirrhosis ([place])    Gastric ulcer    Heart murmur    Hypertension    Hypertensive retinopathy    OU    Current Outpatient Medications (Endocrine & Metabolic):    alendronate (FOSAMAX) 70 MG tablet, Take 1 tablet (70 mg total) by mouth every 7 (seven) days. Take with a full glass of water on an empty stomach preferably in the morning. Do not chew the tablet. Do not lie down or eat anything for one hour after taking this medication. Do not take any vitamins with this medication.  Current Outpatient Medications (Cardiovascular):    losartan (COZAAR) 100 MG tablet, Take 1 tablet (100 mg total) by mouth daily.   diltiazem (CARDIZEM CD) 240 MG 24 hr capsule, TAKE 1 CAPSULE BY MOUTH DAILY   rosuvastatin (CRESTOR) 40 MG tablet, Take 1 tablet (40 mg total) by mouth daily.   Current Outpatient Medications (Analgesics):    aspirin EC 81 MG tablet, Take 1 tablet (81 mg total) by mouth daily. Swallow whole.   Current Outpatient Medications (Other):    nicotine (NICODERM CQ - DOSED IN MG/24 HOURS) 14 mg/24hr patch, Place 1 patch (14 mg total) onto the skin daily.   Cholecalciferol (VITAMIN D) 50 MCG (2000 UT) tablet, Take 1 tablet (2,000 Units total) by mouth daily.   pantoprazole (PROTONIX) 40 MG tablet, TAKE 1 TABLET BY MOUTH DAILY  BEFORE BREAKFAST  Review of Systems:  Review of system negative unless stated in the problem list or HPI.    Physical Exam:  Vitals:   09/25/21 0904  BP: (!) 151/86  Pulse: 72  Temp: 98 F (36.7 C)  TempSrc: Oral  SpO2: 100%  Weight: 133 lb 3.2 oz (60.4 kg)  Height: 5'  2.5" (1.588 m)    Physical Exam General: NAD HENT: NCAT Lungs: CTAB, no wheeze, rhonchi or rales.  Cardiovascular: Normal heart sounds, systolic murmur present, 2+ radial pulses and non-palpable pedal pulses. No LE edema. Carotid bruit heard bilaterally.  Abdomen: No TTP, normal bowel sounds MSK: No asymmetry or muscle atrophy.  Skin: no lesions noted on exposed skin Neuro: Alert and oriented x4. CN grossly intact Psych: Normal mood and normal affect   Assessment & Plan:   Vitamin D deficiency Pt last vitamin D level was low at 20 in 05/2020. She is currently not taking any supplementation. Will start pt on Vitamin D supplementation as she will be low given low result and no supplementation. Will check her Vitamin D level during follow up visit along with BMP. Her last bone density testing 2 years ago showed osteoporosis with T score of -3.0. Will start fosamax weekly with plan to continue it for 5 years.   -Vitamin D testing showed vitamin D was at 18. Pt started on Vitamin D 2000 units but has not picked up the prescription yet. Encouraged to start supplementation.  -Start Fosamax weekly.   HTN (hypertension) Patient has hx of HTN that is uncontrolled. Her regimen was changed with Losartan increased to 50 mg and Diltiazem continued at 240 mg qd.  BP is improved but not controlled.  -Increase losartan to 100 mg, Continue Cardizem 240 mg qd -Follow up in one month. Message sent to front staff to schedule this.   Tobacco use disorder Counseled on smoking cessation and its benefits to overall health that are immediate. Patient interested in nicotine patches. Advised patient they may not be covered by the insurance and she can purchase them over the counter as well. Offered Varenicline but patient not interested at this time.  -Follow up on smoking cessation on visit in 1 month.   PAD (peripheral artery disease) ([place]) Patient has PAD and is currently asymptomatic. She previously endorsed  claudication symptoms but states she no longer has these. It appears medical management is working well. Given this we will continue current therapy with Aspirin 81 mg qd and Crestor 40 mg qd.    See Encounters Tab for problem based charting.  Patient discussed with Dr. Edrick Kins, MD [facility]ie [facility]ung. [facility] Internal Medicine Residency, PGY-2

## 2021-09-25 ENCOUNTER — Ambulatory Visit (INDEPENDENT_AMBULATORY_CARE_PROVIDER_SITE_OTHER): Payer: Medicare Other | Admitting: Internal Medicine

## 2021-09-25 ENCOUNTER — Encounter: Payer: Self-pay | Admitting: Internal Medicine

## 2021-09-25 VITALS — BP 151/86 | HR 72 | Temp 98.0°F | Ht 62.5 in | Wt 133.2 lb

## 2021-09-25 DIAGNOSIS — E559 Vitamin D deficiency, unspecified: Secondary | ICD-10-CM | POA: Diagnosis not present

## 2021-09-25 DIAGNOSIS — I739 Peripheral vascular disease, unspecified: Secondary | ICD-10-CM

## 2021-09-25 DIAGNOSIS — F17210 Nicotine dependence, cigarettes, uncomplicated: Secondary | ICD-10-CM

## 2021-09-25 DIAGNOSIS — F17200 Nicotine dependence, unspecified, uncomplicated: Secondary | ICD-10-CM

## 2021-09-25 DIAGNOSIS — I10 Essential (primary) hypertension: Secondary | ICD-10-CM | POA: Diagnosis not present

## 2021-09-25 DIAGNOSIS — M810 Age-related osteoporosis without current pathological fracture: Secondary | ICD-10-CM

## 2021-09-25 MED ORDER — LOSARTAN POTASSIUM 100 MG PO TABS: 100.0000 mg | ORAL_TABLET | Freq: Every day | ORAL | 2 refills | Status: DC

## 2021-09-25 MED ORDER — ALENDRONATE SODIUM 70 MG PO TABS: 70.0000 mg | ORAL_TABLET | ORAL | 11 refills | Status: AC

## 2021-09-25 NOTE — Patient Instructions (Signed)
Kylie Torres, it was a pleasure seeing you today! You endorsed feeling well today. Below are some of the things we talked about this visit. We look forward to seeing you in the follow up appointment!  Today we discussed: For your blood pressure we will increase your losartan to 100 mg daily. Continue taking the cardizem. I have sent a medication to help strengthen your bones. Please take it weekly. Take it in the morning and with full glass of water. Take it before eating breakfast. Do not eat for one hour after taking this medication and do not lay down for one hour after taking this medication.  I have sent nicotene patches to the pharmacy to help with quitting smoking.  I will call you with your lab results.   I have ordered the following labs today:   Lab Orders         Vitamin D (25 hydroxy)         BMP8+Anion Gap       Referrals ordered today:   Referral Orders  No referral(s) requested today     I have ordered the following medication/changed the following medications:   Stop the following medications: Medications Discontinued During This Encounter  Medication Reason   alendronate (FOSAMAX) 70 MG tablet Patient has not taken in last 30 days     Start the following medications: No orders of the defined types were placed in this encounter.    Follow-up: 1 month follow up  Please make sure to arrive 15 minutes prior to your next appointment. If you arrive late, you may be asked to reschedule.   We look forward to seeing you next time. Please call our clinic at [phone] if you have any questions or concerns. The best time to call is Monday-Friday from 9am-4pm, but there is someone available 24/7. If after hours or the weekend, call the main hospital number and ask for the Internal Medicine Resident On-Call. If you need medication refills, please notify your pharmacy one week in advance and they will send [place] a request.  Thank you for letting us take part in your  care. Wishing you the best!  Thank you, Idamae Schuller, MD

## 2021-09-26 LAB — BMP8+ANION GAP
Anion Gap: 17 mmol/L (ref 10.0–18.0)
BUN/Creatinine Ratio: 13 (ref 12–28)
BUN: 13 mg/dL (ref 8–27)
CO2: 19 mmol/L — ABNORMAL LOW (ref 20–29)
Calcium: 9.8 mg/dL (ref 8.7–10.3)
Chloride: 107 mmol/L — ABNORMAL HIGH (ref 96–106)
Creatinine, Ser: 0.98 mg/dL (ref 0.57–1.00)
Glucose: 137 mg/dL — ABNORMAL HIGH (ref 70–99)
Potassium: 4 mmol/L (ref 3.5–5.2)
Sodium: 143 mmol/L (ref 134–144)
eGFR: 61 mL/min/{1.73_m2} (ref >59)

## 2021-09-26 LAB — VITAMIN D 25 HYDROXY (VIT D DEFICIENCY, FRACTURES): Vit D, 25-Hydroxy: 18.1 ng/mL — ABNORMAL LOW (ref 30.0–100.0)

## 2021-09-27 MED ORDER — NICOTINE 14 MG/24HR TD PT24: 14.0000 mg | MEDICATED_PATCH | TRANSDERMAL | 2 refills | Status: AC

## 2021-09-27 NOTE — Assessment & Plan Note (Addendum)
Pt last vitamin D level was low at 20 in 05/2020. She is currently not taking any supplementation. Will start pt on Vitamin D supplementation as she will be low given low result and no supplementation. Will check her Vitamin D level during follow up visit along with BMP. Her last bone density testing 2 years ago showed osteoporosis with T score of -3.0. Will start fosamax weekly with plan to continue it for 5 years.   -Vitamin D testing showed vitamin D was at 18. Pt started on Vitamin D 2000 units but has not picked up the prescription yet. Encouraged to start supplementation.  -Start Fosamax weekly.

## 2021-09-27 NOTE — Assessment & Plan Note (Signed)
Patient has hx of HTN that is uncontrolled. Her regimen was changed with Losartan increased to 50 mg and Diltiazem continued at 240 mg qd. BP is improved but not controlled.  -Increase losartan to 100 mg, Continue Cardizem 240 mg qd -Follow up in one month. Message sent to front staff to schedule this.

## 2021-09-27 NOTE — Assessment & Plan Note (Signed)
Counseled on smoking cessation and its benefits to overall health that are immediate. Patient interested in nicotine patches. Advised patient they may not be covered by the insurance and she can purchase them over the counter as well. Offered Varenicline but patient not interested at this time.  -Follow up on smoking cessation on visit in 1 month.

## 2021-09-28 NOTE — Assessment & Plan Note (Signed)
Patient has PAD and is currently asymptomatic. She previously endorsed claudication symptoms but states she no longer has these. It appears medical management is working well. Given this we will continue current therapy with Aspirin 81 mg qd and Crestor 40 mg qd.

## 2021-09-30 NOTE — Progress Notes (Signed)
Internal Medicine Clinic Attending  Case discussed with Dr. Khan  At the time of the visit.  We reviewed the resident's history and exam and pertinent patient test results.  I agree with the assessment, diagnosis, and plan of care documented in the resident's note.  

## 2021-10-27 ENCOUNTER — Encounter: Payer: Medicare Other | Admitting: Internal Medicine

## 2021-11-07 ENCOUNTER — Ambulatory Visit: Payer: Self-pay

## 2021-11-07 NOTE — Patient Outreach (Signed)
  Care Coordination   11/07/2021 Name: Kylie Torres MRN: [id number] DOB: [birth year]   Care Coordination Outreach Attempts:  An unsuccessful telephone outreach was attempted today to offer the patient information about available care coordination services as a benefit of their health plan.   Follow Up Plan:  Additional outreach attempts will be made to offer the patient care coordination information and services.   Encounter Outcome:  No Answer  Care Coordination Interventions Activated:  No   Care Coordination Interventions:  No, not indicated    Johnney Killian, RN, BSN, CCM Care Management Coordinator [facility] Phone: [phone]: [phone]

## 2021-12-01 ENCOUNTER — Other Ambulatory Visit: Payer: Self-pay | Admitting: Internal Medicine

## 2021-12-01 DIAGNOSIS — Z1231 Encounter for screening mammogram for malignant neoplasm of breast: Secondary | ICD-10-CM

## 2021-12-05 ENCOUNTER — Ambulatory Visit (INDEPENDENT_AMBULATORY_CARE_PROVIDER_SITE_OTHER): Payer: Medicare Other

## 2021-12-05 ENCOUNTER — Other Ambulatory Visit: Payer: Self-pay

## 2021-12-05 VITALS — BP 170/84 | HR 67 | Ht 62.5 in | Wt 138.0 lb

## 2021-12-05 VITALS — BP 177/73 | HR 76 | Ht 62.5 in | Wt 138.0 lb

## 2021-12-05 DIAGNOSIS — Z0000 Encounter for general adult medical examination without abnormal findings: Secondary | ICD-10-CM | POA: Diagnosis not present

## 2021-12-05 DIAGNOSIS — I739 Peripheral vascular disease, unspecified: Secondary | ICD-10-CM

## 2021-12-05 DIAGNOSIS — F17210 Nicotine dependence, cigarettes, uncomplicated: Secondary | ICD-10-CM

## 2021-12-05 DIAGNOSIS — E559 Vitamin D deficiency, unspecified: Secondary | ICD-10-CM | POA: Diagnosis not present

## 2021-12-05 DIAGNOSIS — Z23 Encounter for immunization: Secondary | ICD-10-CM

## 2021-12-05 DIAGNOSIS — I10 Essential (primary) hypertension: Secondary | ICD-10-CM

## 2021-12-05 DIAGNOSIS — F17200 Nicotine dependence, unspecified, uncomplicated: Secondary | ICD-10-CM

## 2021-12-05 DIAGNOSIS — E785 Hyperlipidemia, unspecified: Secondary | ICD-10-CM | POA: Diagnosis not present

## 2021-12-05 MED ORDER — VARENICLINE TARTRATE 0.5 MG PO TABS: ORAL_TABLET | ORAL | 0 refills | Status: DC

## 2021-12-05 MED ORDER — HYDROCHLOROTHIAZIDE 25 MG PO TABS: 25.0000 mg | ORAL_TABLET | Freq: Every day | ORAL | 1 refills | Status: DC

## 2021-12-05 MED ORDER — VITAMIN D 50 MCG (2000 UT) PO TABS: 2000.0000 [IU] | ORAL_TABLET | Freq: Every day | ORAL | 1 refills | Status: AC

## 2021-12-05 NOTE — Progress Notes (Deleted)
Osteoporosis vit D deficiency Vitamin D testing showed vitamin D was at 18 09/2021. Pt started on Vitamin D 2000 units but has not picked up the prescription yet. Encouraged to start supplementation.   Alendronate  PAD LE arterial duplex 10/2015 showed right profunda femoral artery with >50% stenosis, left posterior tibial artery with >50% stenosis and RABI of 0.65 and LABI of 0.58. Refused invasive therapy. Declined cilostazol. Currently asymptomatic.  LDL 78 05/2021 Asa Rosuvastatin 40  HTN Overview from 2019-Concern for hypertrophic cardiomyopathy. Patient is on valsartan-HCTZ. Per her cardiologist, please do not increase medication dose to avoid LVOT obstruction.   Losartan 100  dilt 240  BMP normal 05/2021  Tobacco use disorder Patches vs 1800 quit now  HCM flu

## 2021-12-05 NOTE — Progress Notes (Unsigned)
Established Patient Office Visit  Subjective   Patient ID: Kylie Torres, female    DOB: 28-Oct-1945  Age: 76 y.o. MRN: [id number]  Chief Complaint  Patient presents with   Leg Pain    Ms. Krogh is a 76 y/o female with a pmh outlined below. Please see encounter tab for HPI and A/P.  Leg Pain     {History (Optional):23778}  Review of Systems  All other systems reviewed and are negative.     Objective:     BP (!) 170/84 (BP Location: Left Arm, Cuff Size: Normal)   Pulse 67   Ht 5' 2.5" (1.588 m)   Wt 138 lb (62.6 kg)   SpO2 100%   BMI 24.84 kg/m  {Vitals History (Optional):23777}  Physical Exam Constitutional:      General: She is not in acute distress.    Appearance: Normal appearance. She is normal weight.  Cardiovascular:     Rate and Rhythm: Normal rate and regular rhythm.     Heart sounds: Normal heart sounds.     No friction rub. No gallop.     Comments: LUSB systolic ejection murmur that disappears with inspiration and breath holding. PT and pedal pulses not palpable bilaterally Pulmonary:     Effort: Pulmonary effort is normal. No respiratory distress.     Breath sounds: Normal breath sounds. No wheezing or rales.  Musculoskeletal:     Right lower leg: No edema.     Left lower leg: No edema.  Skin:    General: Skin is warm and dry.     Capillary Refill: Capillary refill takes less than 2 seconds.  Neurological:     Mental Status: She is alert.      No results found for any visits on 12/05/21.  {Labs (Optional):23779}  The 10-year ASCVD risk score (Arnett DK, et al., 2019) is: 30.3%    Assessment & Plan:   Problem List Items Addressed This Visit       Cardiovascular and Mediastinum   HTN (hypertension)    Patients presents with BP elevated to 177/73 and 170/89 on repeat. Will need good BP control given LVH and PAD -Start HCTZ 25 daily -Losartan 100 -Diltiazem 240      Relevant Medications   hydrochlorothiazide (HYDRODIURIL) 25 MG  tablet   PAD (peripheral artery disease) ([place])    Patient endorses 1 week of tight pain in her posterior thighs and calves when walking, exacerbated particularly when walking 2 hours for a parade a week ago. This also happens at times when going to the mailbox. She says the L >R. She denies back pain or loss of motor or sensory function. Although she has known vitamin D deficiency and statin use the distribution, quality and aggravation of pain with walking with known PAD makes this most likely. Will optimize BP, LDL and tobacco cessation. -Asa 81 -Rosuvastatin 40 -lipid panel, goal LDL<70 -PT referral -ABI -Vasc surgery referral      Relevant Medications   hydrochlorothiazide (HYDRODIURIL) 25 MG tablet   varenicline (CHANTIX) 0.5 MG tablet   Other Relevant Orders   Lipid Profile   VAS [place] ABI WITH/WO TBI   Ambulatory referral to Vascular Surgery   Ambulatory referral to Physical Therapy     Other   Tobacco use disorder    Currently smoking a cigarette daily, the daughter thinks she has cut back due to the daughter being around. Important to stop entirely given PAD with claudication symptoms.We have prescribed chantix to  help with cravings and cessation:0.5 mg orally once daily for 3 days, then 0.5 mg twice daily on days 4 through 7, and then 1 mg twice daily thereafter for 12 weeks of treatment; 12 additional weeks of therapy may increase likelihood of long-term abstinence in patients who successfully stop smoking       Relevant Medications   varenicline (CHANTIX) 0.5 MG tablet   Vitamin D deficiency   Relevant Medications   Cholecalciferol (VITAMIN D) 50 MCG (2000 UT) tablet   Other Visit Diagnoses     Need for immunization against influenza    -  Primary   Relevant Orders   Flu Vaccine QUAD High Dose(Fluad) (Completed)       No follow-ups on file.    Iona Coach, MD

## 2021-12-05 NOTE — Assessment & Plan Note (Signed)
Currently smoking a cigarette daily, the daughter thinks she has cut back due to the daughter being around. Important to stop entirely given PAD with claudication symptoms.We have prescribed chantix to help with cravings and cessation:0.5 mg orally once daily for 3 days, then 0.5 mg twice daily on days 4 through 7, and then 1 mg twice daily thereafter for 12 weeks of treatment; 12 additional weeks of therapy may increase likelihood of long-term abstinence in patients who successfully stop smoking

## 2021-12-05 NOTE — Assessment & Plan Note (Signed)
Patient endorses 1 week of tight pain in her posterior thighs and calves when walking, exacerbated particularly when walking 2 hours for a parade a week ago. This also happens at times when going to the mailbox. She says the L >R. She denies back pain or loss of motor or sensory function. Although she has known vitamin D deficiency and statin use the distribution, quality and aggravation of pain with walking with known PAD makes this most likely. Will optimize BP, LDL and tobacco cessation. -Asa 81 -Rosuvastatin 40 -lipid panel, goal LDL<70 -PT referral -ABI -Vasc surgery referral

## 2021-12-05 NOTE — Patient Instructions (Signed)
Thank you, Ms.Rhen Michelene Heady for allowing [place] to provide your care today. Today we discussed :  Leg cramping- This is due to blockage in the arteries of your legs. We will need to make sure your cholesterol levels are good and adjust medications if needed to get them within goal. We will also refer you to get the blood pressures taken in the legs (ABI= ankle brachial index) and to a vascular surgeon who specializes in this. We will also work on stopping smoking and controlling blood pressure.  High blood pressure- We have added another blood pressure medicine called HCTZ.  Smoking- We have prescribed chantix to help with cravings and cessation.  0.5 mg orally once daily for 3 days, then 0.5 mg twice daily on days 4 through 7, and then 1 mg twice daily thereafter for 12 weeks of treatment; 12 additional weeks of therapy may increase likelihood of long-term abstinence in patients who successfully stop smoking   I have ordered the following labs for you:   Lab Orders         Lipid Profile        Referrals ordered today:    Referral Orders         Ambulatory referral to Vascular Surgery         Ambulatory referral to Physical Therapy      I have ordered the following medication/changed the following medications:   Stop the following medications: There are no discontinued medications.   Start the following medications: Meds ordered this encounter  Medications   hydrochlorothiazide (HYDRODIURIL) 25 MG tablet    Sig: Take 1 tablet (25 mg total) by mouth daily.    Dispense:  30 tablet    Refill:  1   varenicline (CHANTIX) 0.5 MG tablet    Sig: 0.5 mg orally once daily for 3 days, then 0.5 mg twice daily on days 4 through 7, and then 1 mg twice daily thereafter for 12 weeks of treatment    Dispense:  55 tablet    Refill:  0   Cholecalciferol (VITAMIN D) 50 MCG (2000 UT) tablet    Sig: Take 1 tablet (2,000 Units total) by mouth daily.    Dispense:  30 tablet    Refill:  1      Follow up:  1 month      We look forward to seeing you next time. Please call our clinic at [phone] if you have any questions or concerns. The best time to call is Monday-Friday from 9am-4pm, but there is someone available 24/7. If after hours or the weekend, call the main hospital number and ask for the Internal Medicine Resident On-Call. If you need medication refills, please notify your pharmacy one week in advance and they will send [place] a request.   Thank you for trusting me with your care. Wishing you the best!   Iona Coach, MD [place]

## 2021-12-05 NOTE — Progress Notes (Signed)
Subjective:   Kylie Torres is a 76 y.o. female who presents for Medicare Annual (Subsequent) preventive examination. I connected with  Kylie Torres on 12/05/21 by a  Face-To-Face  enabled telemedicine application and verified that I am speaking with the correct person using two identifiers.  Patient Location: Other:  Office/Clinic  Provider Location: Office/Clinic  I discussed the limitations of evaluation and management by telemedicine. The patient expressed understanding and agreed to proceed.  Review of Systems    Defer to PCP       Objective:    Today's Vitals   12/05/21 0939 12/05/21 0940  BP: (!) 177/73   Pulse: 76   SpO2: 100%   Weight: 138 lb (62.6 kg)   Height: 5' 2.5" (1.588 m)   PainSc:  8    Body mass index is 24.84 kg/m.     12/05/2021    9:41 AM 12/05/2021    9:13 AM 09/25/2021    9:06 AM 09/11/2021    1:15 PM 06/05/2021    1:30 PM 10/10/2020    1:13 PM 06/05/2020    1:48 PM  Advanced Directives  Does Patient Have a Medical Advance Directive? No No No No No No No  Would patient like information on creating a medical advance directive? No - Patient declined No - Patient declined Yes (Inpatient - patient requests chaplain consult to create a medical advance directive) No - Patient declined No - Patient declined No - Patient declined No - Patient declined    Current Medications (verified) Outpatient Encounter Medications as of 12/05/2021  Medication Sig   alendronate (FOSAMAX) 70 MG tablet Take 1 tablet (70 mg total) by mouth every 7 (seven) days. Take with a full glass of water on an empty stomach preferably in the morning. Do not chew the tablet. Do not lie down or eat anything for one hour after taking this medication. Do not take any vitamins with this medication.   aspirin EC 81 MG tablet Take 1 tablet (81 mg total) by mouth daily. Swallow whole.   diltiazem (CARDIZEM CD) 240 MG 24 hr capsule TAKE 1 CAPSULE BY MOUTH DAILY   losartan (COZAAR) 100  MG tablet Take 1 tablet (100 mg total) by mouth daily.   nicotine (NICODERM CQ - DOSED IN MG/24 HOURS) 14 mg/24hr patch Place 1 patch (14 mg total) onto the skin daily.   pantoprazole (PROTONIX) 40 MG tablet TAKE 1 TABLET BY MOUTH DAILY  BEFORE BREAKFAST   rosuvastatin (CRESTOR) 40 MG tablet Take 1 tablet (40 mg total) by mouth daily.   No facility-administered encounter medications on file as of 12/05/2021.    Allergies (verified) Patient has no known allergies.   History: Past Medical History:  Diagnosis Date   Acute pain of left shoulder 07/18/2019   Alcohol abuse    Blood transfusion without reported diagnosis    Cirrhosis ([place])    Gastric ulcer    Heart murmur    Hypertension    Hypertensive retinopathy    OU   Past Surgical History:  Procedure Laterality Date   ABDOMINAL HYSTERECTOMY     BREAST EXCISIONAL BIOPSY Left 1971   benign   CATARACT EXTRACTION Bilateral    Dr. Katy Fitch   COLONOSCOPY  04/29/2021   COLONOSCOPY W/ POLYPECTOMY  03/23/2016   ESOPHAGOGASTRODUODENOSCOPY (EGD) WITH PROPOFOL N/A 04/17/2015   Procedure: ESOPHAGOGASTRODUODENOSCOPY (EGD) WITH PROPOFOL;  Surgeon: Ladene Artist, MD;  Location: [facility];  Service: Endoscopy;  Laterality: N/A;   EYE  SURGERY Bilateral    Cat Sx   OOPHORECTOMY     Family History  Problem Relation Age of Onset   Breast cancer Mother 53   Cancer Mother    Heart attack Father    Hypertension Father    Hypertension Sister    Diabetes Sister    Heart attack Brother    Stomach cancer Maternal Aunt    Breast cancer Maternal Aunt        ? age of onset   Colon cancer Neg Hx    Esophageal cancer Neg Hx    Rectal cancer Neg Hx    Social History   Socioeconomic History   Marital status: Divorced    Spouse name: Not on file   Number of children: 3   Years of education: Not on file   Highest education level: Not on file  Occupational History   Occupation: retired  Tobacco Use   Smoking status: Every Day     Packs/day: 1.00    Years: 30.00    Total pack years: 30.00    Types: Cigarettes    Start date: 01/27/2019   Smokeless tobacco: Never  Vaping Use   Vaping Use: Never used  Substance and Sexual Activity   Alcohol use: No    Comment: quit drinking in 03/2015 - previously 1/5 gin every 2 days   Drug use: No   Sexual activity: Not on file  Other Topics Concern   Not on file  Social History Narrative   Current Social History 05/18/2019        Patient lives alone in an apartment which is 2 stories. There are steps with hand rails which the patient uses.       Patient's method of transportation is via her daughter.      The highest level of education was high school diploma.      The patient currently retired.      Identified important Relationships are with her daughter, a good friend who calls her daily, and a neighbor.      Pets : No       Interests / Fun: Watching TV and reading about various subjects       Current Stressors: None       Religious / Personal Beliefs: Holliness       Other: Will I get to see Dr. Frederico Hamman again before she leaves?  Pt informed she has an upcoming appointment with Dr. Frederico Hamman on 07/03/2019 @ 1:15 and this would be included in her AVS.   SChaplin, RN,BSN       Social Determinants of Health   Financial Resource Strain: Low Risk  (12/05/2021)   Overall Financial Resource Strain (CARDIA)    Difficulty of Paying Living Expenses: Not very hard  Food Insecurity: Food Insecurity Present (12/05/2021)   Hunger Vital Sign    Worried About Running Out of Food in the Last Year: Sometimes true    Ran Out of Food in the Last Year: Sometimes true  Transportation Needs: No Transportation Needs (12/05/2021)   PRAPARE - Hydrologist (Medical): No    Lack of Transportation (Non-Medical): No  Physical Activity: Inactive (12/05/2021)   Exercise Vital Sign    Days of Exercise per Week: 1 day    Minutes of Exercise per Session: 0 min   Stress: No Stress Concern Present (12/05/2021)   [place]    Feeling of Stress : Only  a little  Social Connections: Socially Isolated (12/05/2021)   Social Connection and Isolation Panel [NHANES]    Frequency of Communication with Friends and Family: More than three times a week    Frequency of Social Gatherings with Friends and Family: Once a week    Attends Religious Services: Never    Marine scientist or Organizations: No    Attends Music therapist: Never    Marital Status: Divorced    Tobacco Counseling Ready to quit: Not Answered Counseling given: Not Answered   Clinical Intake:  Pre-visit preparation completed: Yes  Pain : 0-10 Pain Score: 8  Pain Type: Acute pain Pain Location: Leg Pain Orientation: Right, Left Pain Descriptors / Indicators: Aching Pain Onset: More than a month ago Pain Frequency: Constant Pain Relieving Factors: ibuprofen  Pain Relieving Factors: ibuprofen  Nutritional Risks: None Diabetes: No  How often do you need to have someone help you when you read instructions, pamphlets, or other written materials from your doctor or pharmacy?: 1 - Never  Diabetic?No  Interpreter Needed?: No  Information entered by :: Corey Skains Launa Goedken,cma 12/05/21 9:41am   Activities of Daily Living    12/05/2021    9:42 AM 12/05/2021    9:13 AM  In your present state of health, do you have any difficulty performing the following activities:  Hearing? 0 0  Vision? 0 0  Difficulty concentrating or making decisions? 0 0  Walking or climbing stairs? 1 1  Dressing or bathing? 0 0  Doing errands, shopping? 0 0    Patient Care Team: Delene Ruffini, MD as PCP - General  Indicate any recent Medical Services you may have received from other than [facility] providers in the past year (date may be approximate).     Assessment:   This is a routine wellness examination for  Kylie Torres.  Hearing/Vision screen No results found.  Dietary issues and exercise activities discussed:     Goals Addressed   None   Depression Screen    12/05/2021    9:42 AM 12/05/2021    9:13 AM 09/25/2021    9:06 AM 09/11/2021    1:15 PM 06/05/2021    1:31 PM 06/05/2020    1:49 PM 07/17/2019    3:36 PM  PHQ 2/9 Scores  PHQ - 2 Score 0 0 0 0 '1 1 2  '$ PHQ- 9 Score      3 3    Fall Risk    12/05/2021    9:41 AM 12/05/2021    9:13 AM 09/25/2021    9:06 AM 09/11/2021    1:14 PM 06/05/2021    1:30 PM  Fall Risk   Falls in the past year? 0 0 0 0 0  Number falls in past yr: 0 0 0 0 0  Injury with Fall? 0 0 0 0 0  Risk for fall due to : No Fall Risks No Fall Risks No Fall Risks No Fall Risks No Fall Risks  Follow up Falls evaluation completed;Falls prevention discussed Falls evaluation completed;Falls prevention discussed Falls prevention discussed;Falls evaluation completed Falls evaluation completed;Falls prevention discussed Falls evaluation completed    FALL RISK PREVENTION PERTAINING TO THE HOME:  Any stairs in or around the home? Yes  If so, are there any without handrails? No  Home free of loose throw rugs in walkways, pet beds, electrical cords, etc? No  Adequate lighting in your home to reduce risk of falls? Yes   ASSISTIVE DEVICES UTILIZED TO PREVENT FALLS:  Life alert? Yes  Use of a cane, walker or w/c? No  Grab bars in the bathroom? No  Shower chair or bench in shower? Yes  Elevated toilet seat or a handicapped toilet? Yes   TIMED UP AND GO:  Was the test performed? Yes .  Length of time to ambulate 10 feet: 1 min.  Gait steady and fast without use of assistive device  Cognitive Function:        12/05/2021    9:42 AM  6CIT Screen  What Year? 0 points  What month? 0 points  What time? 0 points  Count back from 20 0 points  Months in reverse 0 points  Repeat phrase 0 points  Total Score 0 points    Immunizations Immunization History  Administered  Date(s) Administered   Fluad Quad(high Dose 65+) 12/05/2021   Influenza,inj,Quad PF,6+ Mos 12/04/2015, 10/28/2016, 11/12/2017, 12/13/2019   Influenza-Unspecified 10/10/2020   Pneumococcal Conjugate-13 12/04/2015   Pneumococcal Polysaccharide-23 10/28/2016   Tdap 12/04/2015    TDAP status: Up to date  Flu Vaccine status: Up to date  Pneumococcal vaccine status: Up to date  Covid-19 vaccine status: Information provided on how to obtain vaccines.   Qualifies for Shingles Vaccine? No   Zostavax completed No   Shingrix Completed?: No.    Education has been provided regarding the importance of this vaccine. Patient has been advised to call insurance company to determine out of pocket expense if they have not yet received this vaccine. Advised may also receive vaccine at local pharmacy or Health Dept. Verbalized acceptance and understanding.  Screening Tests Health Maintenance  Topic Date Due   COVID-19 Vaccine (1) Never done   Lung Cancer Screening  Never done   Zoster Vaccines- Shingrix (1 of 2) Never done   MAMMOGRAM  10/12/2022   Medicare Annual Wellness (AWV)  12/06/2022   TETANUS/TDAP  12/03/2025   Pneumonia Vaccine 79+ Years old  Completed   INFLUENZA VACCINE  Completed   DEXA SCAN  Completed   Hepatitis C Screening  Completed   HPV VACCINES  Aged Out   COLONOSCOPY (Pts 45-83yr Insurance coverage will need to be confirmed)  Discontinued    Health Maintenance  Health Maintenance Due  Topic Date Due   COVID-19 Vaccine (1) Never done   Lung Cancer Screening  Never done   Zoster Vaccines- Shingrix (1 of 2) Never done    Colorectal cancer screening: Type of screening: Colonoscopy. Completed 04/29/2021. Repeat every 0 years  Mammogram status: Scheduled for 01/30/2022  Bone Density status: Completed 09/14/2019. Results reflect: Bone density results: OSTEOPOROSIS. Repeat every 0 years.  Lung Cancer Screening: (Low Dose CT Chest recommended if Age 74-80years, 30 pack-year  currently smoking OR have quit w/in 15years.) does not qualify.   Lung Cancer Screening Referral: N/A  Additional Screening:  Hepatitis C Screening: does not qualify; Completed 12/04/2015  Vision Screening: Recommended annual ophthalmology exams for early detection of glaucoma and other disorders of the eye. Is the patient up to date with their annual eye exam?  No  Who is the provider or what is the name of the office in which the patient attends annual eye exams? N/A If pt is not established with a provider, would they like to be referred to a provider to establish care? No .   Dental Screening: Recommended annual dental exams for proper oral hygiene  Community Resource Referral / Chronic Care Management: CRR required this visit?  No   CCM required this visit?  No      Plan:     I have personally reviewed and noted the following in the patient's chart:   Medical and social history Use of alcohol, tobacco or illicit drugs  Current medications and supplements including opioid prescriptions. Patient is not currently taking opioid prescriptions. Functional ability and status Nutritional status Physical activity Advanced directives List of other physicians Hospitalizations, surgeries, and ER visits in previous 12 months Vitals Screenings to include cognitive, depression, and falls Referrals and appointments  In addition, I have reviewed and discussed with patient certain preventive protocols, quality metrics, and best practice recommendations. A written personalized care plan for preventive services as well as general preventive health recommendations were provided to patient.     Kerin Perna, [place]   12/05/2021   Nurse Notes: Face-To-Face  [name] , Thank you for taking time to come for your Medicare Wellness Visit. I appreciate your ongoing commitment to your health goals. Please review the following plan we discussed and let me know if I can assist you in the future.    These are the goals we discussed:  Goals       Exercise 2x per week      Will begin seating and standing exercises with exercise band.      Quit Smoking (pt-stated)      Patient reports she quit smoking 05/04/2019, information provided on [place] Quitline.        This is a list of the screening recommended for you and due dates:  Health Maintenance  Topic Date Due   COVID-19 Vaccine (1) Never done   Screening for Lung Cancer  Never done   Zoster (Shingles) Vaccine (1 of 2) Never done   Mammogram  10/12/2022   Medicare Annual Wellness Visit  12/06/2022   Tetanus Vaccine  12/03/2025   Pneumonia Vaccine  Completed   Flu Shot  Completed   DEXA scan (bone density measurement)  Completed   Hepatitis C Screening: USPSTF Recommendation to screen - Ages 53-79 yo.  Completed   HPV Vaccine  Aged Out   Colon Cancer Screening  Discontinued

## 2021-12-05 NOTE — Assessment & Plan Note (Signed)
Patients presents with BP elevated to 177/73 and 170/89 on repeat. Will need good BP control given LVH and PAD -Start HCTZ 25 daily -Losartan 100 -Diltiazem 240

## 2021-12-07 ENCOUNTER — Other Ambulatory Visit: Payer: Self-pay | Admitting: Internal Medicine

## 2021-12-07 DIAGNOSIS — E782 Mixed hyperlipidemia: Secondary | ICD-10-CM

## 2021-12-07 LAB — LIPID PANEL
Chol/HDL Ratio: 2.8 ratio (ref 0.0–4.4)
Cholesterol, Total: 121 mg/dL (ref 100–199)
HDL: 44 mg/dL (ref >39)
LDL Chol Calc (NIH): 64 mg/dL (ref 0–99)
Triglycerides: 61 mg/dL (ref 0–149)
VLDL Cholesterol Cal: 13 mg/dL (ref 5–40)

## 2021-12-08 ENCOUNTER — Other Ambulatory Visit: Payer: Self-pay

## 2021-12-08 DIAGNOSIS — I10 Essential (primary) hypertension: Secondary | ICD-10-CM

## 2021-12-08 MED ORDER — LOSARTAN POTASSIUM 100 MG PO TABS: 100.0000 mg | ORAL_TABLET | Freq: Every day | ORAL | 2 refills | Status: DC

## 2021-12-08 NOTE — Progress Notes (Signed)
Internal Medicine Clinic Attending  I saw and evaluated the patient.  I personally confirmed the key portions of the history and exam documented by Dr. Rogers and I reviewed pertinent patient test results.  The assessment, diagnosis, and plan were formulated together and I agree with the documentation in the resident's note.  

## 2021-12-09 ENCOUNTER — Other Ambulatory Visit: Payer: Self-pay | Admitting: *Deleted

## 2021-12-09 DIAGNOSIS — I739 Peripheral vascular disease, unspecified: Secondary | ICD-10-CM

## 2021-12-12 NOTE — Progress Notes (Signed)
Have attempted to call patient numerous times with no answer. LDL is within goal of <70 for secondary prevention in PAD. Will continue current medication regimen and can repeat lipid panel annually.

## 2021-12-15 ENCOUNTER — Ambulatory Visit (HOSPITAL_COMMUNITY)
Admission: RE | Admit: 2021-12-15 | Discharge: 2021-12-15 | Disposition: A | Discharge status: Home / Self Care | Payer: Medicare Other | Source: Ambulatory Visit | Attending: Surgery | Admitting: Surgery

## 2021-12-15 DIAGNOSIS — I739 Peripheral vascular disease, unspecified: Secondary | ICD-10-CM | POA: Diagnosis not present

## 2021-12-16 ENCOUNTER — Encounter: Payer: Self-pay | Admitting: Vascular Surgery

## 2021-12-16 ENCOUNTER — Ambulatory Visit (INDEPENDENT_AMBULATORY_CARE_PROVIDER_SITE_OTHER): Payer: Medicare Other | Admitting: Vascular Surgery

## 2021-12-16 VITALS — BP 133/85 | HR 76 | Temp 97.2°F | Resp 16 | Ht 61.0 in | Wt 133.0 lb

## 2021-12-16 DIAGNOSIS — I739 Peripheral vascular disease, unspecified: Secondary | ICD-10-CM

## 2021-12-16 MED ORDER — CILOSTAZOL 100 MG PO TABS: 100.0000 mg | ORAL_TABLET | Freq: Two times a day (BID) | ORAL | 11 refills | Status: DC

## 2021-12-16 NOTE — Progress Notes (Signed)
Patient name: Kylie Torres MRN: [id number] DOB: 09-Aug-1945 Sex: female  REASON FOR CONSULT: PAD  HPI: Avi Archuleta is a 76 y.o. female, with history of hypertension and tobacco abuse that presents for evaluation of PAD.  Patient states she has been having pain in both legs for years.  This has gotten worse over the last several months.  She gets pain in both thighs that radiates down her legs.  This typically happens after about 10 to 15 minutes of walking.  No rest pain.  No tissue loss.  She did have ABI screens at home showing PAD on the left and ABI 0.7 on the right.  She is smoking about 5 cigarettes a day.  No previous vascular interventions.  Past Medical History:  Diagnosis Date   Acute pain of left shoulder 07/18/2019   Alcohol abuse    Blood transfusion without reported diagnosis    Cirrhosis ([place])    Gastric ulcer    Heart murmur    Hypertension    Hypertensive retinopathy    OU    Past Surgical History:  Procedure Laterality Date   ABDOMINAL HYSTERECTOMY     BREAST EXCISIONAL BIOPSY Left 1971   benign   CATARACT EXTRACTION Bilateral    Dr. Katy Fitch   COLONOSCOPY  04/29/2021   COLONOSCOPY W/ POLYPECTOMY  03/23/2016   ESOPHAGOGASTRODUODENOSCOPY (EGD) WITH PROPOFOL N/A 04/17/2015   Procedure: ESOPHAGOGASTRODUODENOSCOPY (EGD) WITH PROPOFOL;  Surgeon: Ladene Artist, MD;  Location: [facility];  Service: Endoscopy;  Laterality: N/A;   EYE SURGERY Bilateral    Cat Sx   OOPHORECTOMY      Family History  Problem Relation Age of Onset   Breast cancer Mother 96   Cancer Mother    Heart attack Father    Hypertension Father    Hypertension Sister    Diabetes Sister    Heart attack Brother    Stomach cancer Maternal Aunt    Breast cancer Maternal Aunt        ? age of onset   Colon cancer Neg Hx    Esophageal cancer Neg Hx    Rectal cancer Neg Hx     SOCIAL HISTORY: Social History   Socioeconomic History   Marital status: Divorced    Spouse name: Not  on file   Number of children: 3   Years of education: Not on file   Highest education level: Not on file  Occupational History   Occupation: retired  Tobacco Use   Smoking status: Every Day    Packs/day: 1.00    Years: 30.00    Total pack years: 30.00    Types: Cigarettes    Start date: 01/27/2019   Smokeless tobacco: Never  Vaping Use   Vaping Use: Never used  Substance and Sexual Activity   Alcohol use: No    Comment: quit drinking in 03/2015 - previously 1/5 gin every 2 days   Drug use: No   Sexual activity: Not on file  Other Topics Concern   Not on file  Social History Narrative   Current Social History 05/18/2019        Patient lives alone in an apartment which is 2 stories. There are steps with hand rails which the patient uses.       Patient's method of transportation is via her daughter.      The highest level of education was high school diploma.      The patient currently retired.  Identified important Relationships are with her daughter, a good friend who calls her daily, and a neighbor.      Pets : No       Interests / Fun: Watching TV and reading about various subjects       Current Stressors: None       Religious / Personal Beliefs: Holliness       Other: Will I get to see Dr. Frederico Hamman again before she leaves?  Pt informed she has an upcoming appointment with Dr. Frederico Hamman on 07/03/2019 @ 1:15 and this would be included in her AVS.   SChaplin, RN,BSN       Social Determinants of Health   Financial Resource Strain: Low Risk  (12/05/2021)   Overall Financial Resource Strain (CARDIA)    Difficulty of Paying Living Expenses: Not very hard  Food Insecurity: Food Insecurity Present (12/05/2021)   Hunger Vital Sign    Worried About Running Out of Food in the Last Year: Sometimes true    Ran Out of Food in the Last Year: Sometimes true  Transportation Needs: No Transportation Needs (12/05/2021)   PRAPARE - Hydrologist (Medical):  No    Lack of Transportation (Non-Medical): No  Physical Activity: Inactive (12/05/2021)   Exercise Vital Sign    Days of Exercise per Week: 1 day    Minutes of Exercise per Session: 0 min  Stress: No Stress Concern Present (12/05/2021)   [place]    Feeling of Stress : Only a little  Social Connections: Socially Isolated (12/05/2021)   Social Connection and Isolation Panel [NHANES]    Frequency of Communication with Friends and Family: More than three times a week    Frequency of Social Gatherings with Friends and Family: Once a week    Attends Religious Services: Never    Marine scientist or Organizations: No    Attends Archivist Meetings: Never    Marital Status: Divorced  Human resources officer Violence: Not At Risk (12/05/2021)   Humiliation, Afraid, Rape, and Kick questionnaire    Fear of Current or Ex-Partner: No    Emotionally Abused: No    Physically Abused: No    Sexually Abused: No    No Known Allergies  Current Outpatient Medications  Medication Sig Dispense Refill   alendronate (FOSAMAX) 70 MG tablet Take 1 tablet (70 mg total) by mouth every 7 (seven) days. Take with a full glass of water on an empty stomach preferably in the morning. Do not chew the tablet. Do not lie down or eat anything for one hour after taking this medication. Do not take any vitamins with this medication. 4 tablet 11   aspirin EC 81 MG tablet Take 1 tablet (81 mg total) by mouth daily. Swallow whole. 150 tablet 2   Cholecalciferol (VITAMIN D) 50 MCG (2000 UT) tablet Take 1 tablet (2,000 Units total) by mouth daily. 30 tablet 1   diltiazem (CARDIZEM CD) 240 MG 24 hr capsule TAKE 1 CAPSULE BY MOUTH DAILY 100 capsule 3   hydrochlorothiazide (HYDRODIURIL) 25 MG tablet Take 1 tablet (25 mg total) by mouth daily. 30 tablet 1   losartan (COZAAR) 100 MG tablet Take 1 tablet (100 mg total) by mouth daily. 30 tablet 2    nicotine (NICODERM CQ - DOSED IN MG/24 HOURS) 14 mg/24hr patch Place 1 patch (14 mg total) onto the skin daily. 28 patch 2   pantoprazole (PROTONIX) 40  MG tablet TAKE 1 TABLET BY MOUTH DAILY  BEFORE BREAKFAST 100 tablet 2   rosuvastatin (CRESTOR) 40 MG tablet TAKE 1 TABLET(40 MG) BY MOUTH DAILY 90 tablet 0   varenicline (CHANTIX) 0.5 MG tablet 0.5 mg orally once daily for 3 days, then 0.5 mg twice daily on days 4 through 7, and then 1 mg twice daily thereafter for 12 weeks of treatment 55 tablet 0   No current facility-administered medications for this visit.    REVIEW OF SYSTEMS:  '[X]'$  denotes positive finding, '[ ]'$  denotes negative finding Cardiac  Comments:  Chest pain or chest pressure:    Shortness of breath upon exertion:    Short of breath when lying flat:    Irregular heart rhythm:        Vascular    Pain in calf, thigh, or hip brought on by ambulation: x   Pain in feet at night that wakes you up from your sleep:     Blood clot in your veins:    Leg swelling:         Pulmonary    Oxygen at home:    Productive cough:     Wheezing:         Neurologic    Sudden weakness in arms or legs:     Sudden numbness in arms or legs:     Sudden onset of difficulty speaking or slurred speech:    Temporary loss of vision in one eye:     Problems with dizziness:         Gastrointestinal    Blood in stool:     Vomited blood:         Genitourinary    Burning when urinating:     Blood in urine:        Psychiatric    Major depression:         Hematologic    Bleeding problems:    Problems with blood clotting too easily:        Skin    Rashes or ulcers:        Constitutional    Fever or chills:      PHYSICAL EXAM: Vitals:   12/16/21 0917  BP: 133/85  Pulse: 76  Resp: 16  Temp: (!) 97.2 F (36.2 C)  TempSrc: Temporal  SpO2: 98%  Weight: 133 lb (60.3 kg)  Height: '5\' 1"'$  (1.549 m)    GENERAL: The patient is a well-nourished female, in no acute distress. The vital  signs are documented above. CARDIAC: There is a regular rate and rhythm.  VASCULAR:  Palpable femoral pulses bilaterally Palpable DP pulses bilaterally No lower extremity tissue loss PULMONARY: No respiratory distress. ABDOMEN: Soft and non-tender. MUSCULOSKELETAL: There are no major deformities or cyanosis. NEUROLOGIC: No focal weakness or paresthesias are detected. PSYCHIATRIC: The patient has a normal affect.  DATA:   ABIs yesterday were 0.92 on the right biphasic and 0.92 on the left biphasic - evidence of mild peripheral arterial disease   Assessment/Plan:  76 year old female presents for evaluation of PAD.  She describes bilateral lower extremity thigh pain after walking about 10 to 15 minutes.  She has no associated back pain to suggest there is any neurogenic association.  I discussed that certainly could be vascular claudication based on her risk factors and mildly abnormal ABIs.  Discussed claudication is managed medically.  No evidence of critical limb ischemia like rest pain or tissue loss.  She is already on aspirin and statin.  I discussed smoking cessation and walking therapies 30 minutes a day 3 times a week.  I also prescribed Pletal 100 mg twice daily.  I will see her in 6 months.   Marty Heck, MD [facility] Office: [phone]

## 2022-01-02 ENCOUNTER — Other Ambulatory Visit: Payer: Self-pay

## 2022-01-02 DIAGNOSIS — I10 Essential (primary) hypertension: Secondary | ICD-10-CM

## 2022-01-05 ENCOUNTER — Telehealth: Payer: Self-pay | Admitting: *Deleted

## 2022-01-05 ENCOUNTER — Encounter: Payer: Medicare Other | Admitting: Student

## 2022-01-05 NOTE — Patient Instructions (Incomplete)
HTN Elevated at last OV. Hx of hypertrophic cardiomyopathy and PAD. At the time, taking Dilt 240 mg and Losartan 100 mg daily. Was started on HCTZ 25 mg at last OV 12/05/2021. She is also on Losartan 100 mg and Diltiazem 240 mg. Adverse medication side effects? New palpitations, chest pain, lightheadednes, SOB.   PAD Tightness in bilateral posterior calves after ~10-15 of exertion. This is chronic issue that has gotten worse over the past few weeks. L>R. Denies pain or motor weakness. No pain at rest or skin changes at last OV. Risk factor: HTN, HLD, hx of smoking. Patient was seen by vascular surgery. -ASA 81 mg -Rosuvastatin 40 mg -Symptomatic relief with cilostazol 100 mg BID  Osteoporosis On Alendronate   Vitamin D ?

## 2022-01-05 NOTE — Telephone Encounter (Signed)
Called patient unable to leave voice message /  patient did not keep appointment.

## 2022-01-15 ENCOUNTER — Telehealth: Payer: Self-pay

## 2022-01-15 NOTE — Patient Outreach (Signed)
  Care Coordination   01/15/2022 Name: Kylie Torres MRN: [id number] DOB: 1945-05-31   Care Coordination Outreach Attempts:  A second unsuccessful outreach was attempted today to offer the patient with information about available care coordination services as a benefit of their health plan.     Follow Up Plan:  Additional outreach attempts will be made to offer the patient care coordination information and services.   Encounter Outcome:  No Answer   Care Coordination Interventions:  No, not indicated    [name], RN, MSN [facility] Care Management Coordinator Direct Line [phone]

## 2022-01-30 ENCOUNTER — Ambulatory Visit
Admission: RE | Admit: 2022-01-30 | Discharge: 2022-01-30 | Disposition: A | Discharge status: Home / Self Care | Payer: Medicare Other | Source: Ambulatory Visit | Attending: Internal Medicine | Admitting: Internal Medicine

## 2022-01-30 ENCOUNTER — Telehealth: Payer: Self-pay

## 2022-01-30 DIAGNOSIS — Z1231 Encounter for screening mammogram for malignant neoplasm of breast: Secondary | ICD-10-CM | POA: Diagnosis not present

## 2022-01-30 NOTE — Patient Outreach (Signed)
  Care Coordination   01/30/2022 Name: Kylie Torres MRN: [id number] DOB: 1945/12/04   Care Coordination Outreach Attempts:  A third unsuccessful outreach was attempted today to offer the patient with information about available care coordination services as a benefit of their health plan.   Follow Up Plan:  No further outreach attempts will be made at this time. We have been unable to contact the patient to offer or enroll patient in care coordination services  Encounter Outcome:  No Answer   Care Coordination Interventions:  No, not indicated    [name], RN, MSN [facility] Care Management Coordinator Direct Line [phone]

## 2022-02-05 ENCOUNTER — Other Ambulatory Visit: Payer: Self-pay | Admitting: Internal Medicine

## 2022-03-05 ENCOUNTER — Other Ambulatory Visit (HOSPITAL_COMMUNITY): Payer: Self-pay

## 2022-03-05 ENCOUNTER — Encounter: Payer: Self-pay | Admitting: Internal Medicine

## 2022-03-05 ENCOUNTER — Other Ambulatory Visit (HOSPITAL_BASED_OUTPATIENT_CLINIC_OR_DEPARTMENT_OTHER): Payer: Self-pay

## 2022-03-05 ENCOUNTER — Other Ambulatory Visit: Payer: Self-pay

## 2022-03-05 ENCOUNTER — Ambulatory Visit (INDEPENDENT_AMBULATORY_CARE_PROVIDER_SITE_OTHER): Payer: 59 | Admitting: Internal Medicine

## 2022-03-05 VITALS — BP 178/88 | HR 70 | Temp 98.2°F | Resp 28 | Ht 61.0 in | Wt 133.5 lb

## 2022-03-05 DIAGNOSIS — F17200 Nicotine dependence, unspecified, uncomplicated: Secondary | ICD-10-CM

## 2022-03-05 DIAGNOSIS — F17210 Nicotine dependence, cigarettes, uncomplicated: Secondary | ICD-10-CM | POA: Diagnosis not present

## 2022-03-05 DIAGNOSIS — I10 Essential (primary) hypertension: Secondary | ICD-10-CM | POA: Diagnosis not present

## 2022-03-05 DIAGNOSIS — E782 Mixed hyperlipidemia: Secondary | ICD-10-CM | POA: Diagnosis not present

## 2022-03-05 MED ORDER — HYDROCHLOROTHIAZIDE 25 MG PO TABS
25.0000 mg | ORAL_TABLET | Freq: Every day | ORAL | 1 refills | Status: DC
  Filled 2022-03-05 (×2): qty 90, 90d supply, fill #0

## 2022-03-05 MED ORDER — LOSARTAN POTASSIUM 100 MG PO TABS
100.0000 mg | ORAL_TABLET | Freq: Every day | ORAL | 2 refills | Status: DC
  Filled 2022-03-05 (×2): qty 30, 30d supply, fill #0

## 2022-03-05 MED ORDER — DILTIAZEM HCL ER COATED BEADS 240 MG PO CP24
240.0000 mg | ORAL_CAPSULE | Freq: Every day | ORAL | 2 refills | Status: DC
  Filled 2022-03-05 (×2): qty 30, 30d supply, fill #0

## 2022-03-05 MED ORDER — CILOSTAZOL 100 MG PO TABS
100.0000 mg | ORAL_TABLET | Freq: Two times a day (BID) | ORAL | 11 refills | Status: DC
  Filled 2022-03-05 (×2): qty 60, 30d supply, fill #0

## 2022-03-05 MED ORDER — NICOTINE 21 MG/24HR TD PT24
21.0000 mg | MEDICATED_PATCH | TRANSDERMAL | 1 refills | Status: AC
  Filled 2022-03-05: qty 28, 28d supply, fill #0

## 2022-03-05 MED ORDER — ROSUVASTATIN CALCIUM 40 MG PO TABS
40.0000 mg | ORAL_TABLET | Freq: Every day | ORAL | 1 refills | Status: DC
  Filled 2022-03-05 (×2): qty 90, 90d supply, fill #0

## 2022-03-05 MED ORDER — ASPIRIN 81 MG PO TBEC
81.0000 mg | DELAYED_RELEASE_TABLET | Freq: Every day | ORAL | 2 refills | Status: AC
  Filled 2022-03-05: qty 100, 100d supply, fill #0

## 2022-03-05 NOTE — Patient Instructions (Signed)
Dear Kylie Torres,  Thank you for trusting [place] with your care today.   We discussed your blood pressure and smoking,  For your blood pressure, please take the diltiazem, losartan, and hydrochlorothiazide daily. Please check your blood pressure at home and record the readings. Bring this with you to your next visit. I have sent your meds to the [facility] to set up delivery.  For the smoking, please use nicotine patches and gum to help quit. I have also ordered for the CT scan to be done at some point.   Please return in 1 month for a blood pressure check.

## 2022-03-05 NOTE — Progress Notes (Signed)
   CC: htn  HPI:[name] is a 77 y.o. female who presents for evaluation of htn. Please see individual problem based A/P for details.  87 yof with hx HTN, cardiomyopathy, PAD, HLD here for checkup   Depression, PHQ-9: Based on the patients  [place] Visit from 06/05/2020 in [facility]  PHQ-9 Total Score 3      score we have .  Past Medical History:  Diagnosis Date   Acute pain of left shoulder 07/18/2019   Alcohol abuse    Blood transfusion without reported diagnosis    Cirrhosis ([place])    Gastric ulcer    Heart murmur    Hypertension    Hypertensive retinopathy    OU   Review of Systems:   See hpi  Physical Exam: Vitals:   03/05/22 0854 03/05/22 0900  BP: (!) 183/92 (!) 178/88  Pulse: 72 70  Resp: (!) 28   Temp: 98.2 F (36.8 C)   TempSrc: Oral   SpO2: 100%   Weight: 133 lb 8 oz (60.6 kg)   Height: '5\' 1"'$  (1.549 m)    General: nad HEENT: Conjunctiva nl , antiicteric sclerae, moist mucous membranes, no exudate or erythema Cardiovascular: Normal rate, regular rhythm.  Systolic murmur Pulmonary : Equal breath sounds, No wheezes, rales, or rhonchi Abdominal: soft, nontender,  bowel sounds present Ext: No edema in lower extremities, no tenderness to palpation of lower extremities.   Assessment & Plan:   See Encounters Tab for problem based charting.  Patient discussed with Dr.  Cain Sieve

## 2022-03-08 ENCOUNTER — Encounter: Payer: Self-pay | Admitting: Internal Medicine

## 2022-03-08 NOTE — Assessment & Plan Note (Signed)
Tobacco use ongoing issue. She was prescribed Chantix but never took this. She is more interested in nicotine replacement therapy.   She is also due to low dose CT scan and would benefit from this. We will order this test and try the patient on nicotine patches and gum for breakthrough cravings.

## 2022-03-08 NOTE — Assessment & Plan Note (Signed)
Patient has difficulty adhering to her regimen due to the number of changes. She does not want to adjust regimen today and prefers to attempt better adherence before further alterations. Also has trouble picking up/filling scripts. Interested in delivery. Also smoked cigarette prior to OV.  HTN uncontrolled due to medication non-adherences.Will not make any changes to her regimen at this time. We will send scripts to [facility] so that patient can set up delivery option. Will have patient check BP and reord log so that we can make appropriate adjustments as necessary.

## 2022-03-09 NOTE — Progress Notes (Signed)
Internal Medicine Clinic Attending ° °Case discussed with Dr. Gawaluck  At the time of the visit.  We reviewed the resident’s history and exam and pertinent patient test results.  I agree with the assessment, diagnosis, and plan of care documented in the resident’s note.  °

## 2022-03-26 ENCOUNTER — Ambulatory Visit (HOSPITAL_COMMUNITY): Payer: 59

## 2022-04-13 ENCOUNTER — Ambulatory Visit (HOSPITAL_COMMUNITY)
Admission: RE | Admit: 2022-04-13 | Discharge: 2022-04-13 | Disposition: A | Discharge status: Home / Self Care | Payer: 59 | Source: Ambulatory Visit | Attending: Internal Medicine | Admitting: Internal Medicine

## 2022-04-13 DIAGNOSIS — Z122 Encounter for screening for malignant neoplasm of respiratory organs: Secondary | ICD-10-CM | POA: Diagnosis not present

## 2022-04-13 DIAGNOSIS — J439 Emphysema, unspecified: Secondary | ICD-10-CM | POA: Insufficient documentation

## 2022-04-13 DIAGNOSIS — I2510 Atherosclerotic heart disease of native coronary artery without angina pectoris: Secondary | ICD-10-CM | POA: Diagnosis not present

## 2022-04-13 DIAGNOSIS — I3139 Other pericardial effusion (noninflammatory): Secondary | ICD-10-CM | POA: Diagnosis not present

## 2022-04-13 DIAGNOSIS — I700 Atherosclerosis of aorta: Secondary | ICD-10-CM | POA: Diagnosis not present

## 2022-04-13 DIAGNOSIS — F17210 Nicotine dependence, cigarettes, uncomplicated: Secondary | ICD-10-CM | POA: Insufficient documentation

## 2022-04-13 DIAGNOSIS — F17200 Nicotine dependence, unspecified, uncomplicated: Secondary | ICD-10-CM | POA: Insufficient documentation

## 2022-04-13 DIAGNOSIS — R911 Solitary pulmonary nodule: Secondary | ICD-10-CM | POA: Diagnosis not present

## 2022-04-14 ENCOUNTER — Telehealth: Payer: Self-pay | Admitting: *Deleted

## 2022-04-16 NOTE — Telephone Encounter (Signed)
Thank you Gladys!

## 2022-04-29 NOTE — Progress Notes (Signed)
Called and discussed CT findings with patient and daughter. CT finding most consistent with benign nodule, but given her history we will have repeat CT scan 6 months to monitor. All questions answered. Patient has upcoming office visit with me and can discuss this further as necessary. She would benefit from smoking cessation.

## 2022-05-01 ENCOUNTER — Encounter: Payer: Self-pay | Admitting: Internal Medicine

## 2022-05-01 DIAGNOSIS — R911 Solitary pulmonary nodule: Secondary | ICD-10-CM | POA: Insufficient documentation

## 2022-05-05 ENCOUNTER — Telehealth: Payer: Self-pay | Admitting: Pharmacist

## 2022-05-05 NOTE — Progress Notes (Signed)
Patient attempted to be outreached by Michiel Cowboy, PharmD Candidate on 05/05/2022 to discuss hypertension. Patient voicemail box is full, no message was left.   Michiel Cowboy, PharmD Candidate

## 2022-05-08 ENCOUNTER — Ambulatory Visit: Payer: 59

## 2022-05-08 ENCOUNTER — Encounter: Payer: 59 | Admitting: Internal Medicine

## 2022-05-08 NOTE — Progress Notes (Deleted)
   CC: ***  HPI:Kylie Torres is a 77 y.o. female who presents for evaluation of ***. Please see individual problem based A/P for details.  77 yof Here to discuss smoking and htn  HTN - adherence poor. She didn't want to make changes again. - delivery? - looks better ,but the losartan and the dilt only got 30 day supply even though ordered for 90.  - bp log?  Smoking  Nicotine replacement worked? She is aware of the cct scan   Depression, PHQ-9: Based on the patients  Flowsheet Row Office Visit from 06/05/2020 in [facility]  PHQ-9 Total Score 3      score we have ***.  Past Medical History:  Diagnosis Date   Acute pain of left shoulder 07/18/2019   Alcohol abuse    Blood transfusion without reported diagnosis    Cirrhosis (H[facility])    Gastric ulcer    Heart murmur    Hypertension    Hypertensive retinopathy    OU   Review of Systems:   ROS   Physical Exam: There were no vitals filed for this visit.   General: *** HEENT: Conjunctiva nl , antiicteric sclerae, moist mucous membranes, no exudate or erythema Cardiovascular: Normal rate, regular rhythm.  No murmurs, rubs, or gallops Pulmonary : Equal breath sounds, No wheezes, rales, or rhonchi Abdominal: soft, nontender,  bowel sounds present Ext: No edema in lower extremities, no tenderness to palpation of lower extremities.   Assessment & Plan:   See Encounters Tab for problem based charting.  Patient {GC/GE:[id number]::"discussed with","seen with"} Dr. {ZOXWR:[id number]::"W. Hoffman","Guilloud","Mullen","Narendra","Raines","Vincent","Williams"}

## 2022-06-23 ENCOUNTER — Ambulatory Visit: Payer: 59 | Admitting: Vascular Surgery

## 2022-07-01 ENCOUNTER — Encounter: Payer: Self-pay | Admitting: *Deleted

## 2022-07-07 ENCOUNTER — Ambulatory Visit (INDEPENDENT_AMBULATORY_CARE_PROVIDER_SITE_OTHER): Payer: 59 | Admitting: Vascular Surgery

## 2022-07-07 ENCOUNTER — Encounter: Payer: Self-pay | Admitting: Vascular Surgery

## 2022-07-07 VITALS — BP 189/95 | HR 76 | Temp 97.8°F | Resp 14 | Ht 62.5 in | Wt 133.0 lb

## 2022-07-07 DIAGNOSIS — I739 Peripheral vascular disease, unspecified: Secondary | ICD-10-CM | POA: Diagnosis not present

## 2022-07-07 MED ORDER — CILOSTAZOL 100 MG PO TABS: 100.0000 mg | ORAL_TABLET | Freq: Two times a day (BID) | ORAL | 11 refills | Status: DC

## 2022-07-07 NOTE — Progress Notes (Signed)
Patient name: Kylie Torres MRN: [id number] DOB: [birth year] Sex: female  REASON FOR CONSULT: 6 month follow-up PAD  HPI: Kylie Torres is a 76 y.o. female, with history of hypertension and tobacco abuse that presents for 6 month follow-up of PAD.  Patient previously seen with bilateral lower extremity pain after walking about 10 to 15 minutes.  There was concern for PAD on a home screening.  ABIs here in the office on 12/15/2021 were 0.92 on the right biphasic and 0.92 on the left biphasic.  Since last evaluation having more cramping in her right leg when she walks.  This usually starts after 200 feet going to her mailbox.  Still is still able to do most of her activities of daily living including shopping.  She is taking Pletal.  Smoking a pack every 2 days.  No tissue loss or rest pain.  Past Medical History:  Diagnosis Date   Acute pain of left shoulder 07/18/2019   Alcohol abuse    Blood transfusion without reported diagnosis    Cirrhosis (HCC)    Gastric ulcer    Heart murmur    Hypertension    Hypertensive retinopathy    OU    Past Surgical History:  Procedure Laterality Date   ABDOMINAL HYSTERECTOMY     BREAST EXCISIONAL BIOPSY Left 1971   benign   CATARACT EXTRACTION Bilateral    Dr. Dione Booze   COLONOSCOPY  04/29/2021   COLONOSCOPY W/ POLYPECTOMY  03/23/2016   ESOPHAGOGASTRODUODENOSCOPY (EGD) WITH PROPOFOL N/A 04/17/2015   Procedure: ESOPHAGOGASTRODUODENOSCOPY (EGD) WITH PROPOFOL;  Surgeon: Meryl Dare, MD;  Location: [facility];  Service: Endoscopy;  Laterality: N/A;   EYE SURGERY Bilateral    Cat Sx   OOPHORECTOMY      Family History  Problem Relation Age of Onset   Breast cancer Mother 5   Cancer Mother    Heart attack Father    Hypertension Father    Hypertension Sister    Diabetes Sister    Heart attack Brother    Stomach cancer Maternal Aunt    Breast cancer Maternal Aunt        ? age of onset   Colon cancer Neg Hx    Esophageal cancer  Neg Hx    Rectal cancer Neg Hx     SOCIAL HISTORY: Social History   Socioeconomic History   Marital status: Divorced    Spouse name: Not on file   Number of children: 3   Years of education: Not on file   Highest education level: Not on file  Occupational History   Occupation: retired  Tobacco Use   Smoking status: Every Day    Packs/day: 1.00    Years: 30.00    Additional pack years: 0.00    Total pack years: 30.00    Types: Cigarettes    Start date: 01/27/2019   Smokeless tobacco: Never   Tobacco comments:    Smoked cigarette this am 7:30 ish  Vaping Use   Vaping Use: Never used  Substance and Sexual Activity   Alcohol use: No    Comment: quit drinking in 03/2015 - previously 1/5 gin every 2 days   Drug use: No   Sexual activity: Not on file  Other Topics Concern   Not on file  Social History Narrative   Current Social History 05/18/2019        Patient lives alone in an apartment which is 2 stories. There are steps with hand rails which  the patient uses.       Patient's method of transportation is via her daughter.      The highest level of education was high school diploma.      The patient currently retired.      Identified important Relationships are with her daughter, a good friend who calls her daily, and a neighbor.      Pets : No       Interests / Fun: Watching TV and reading about various subjects       Current Stressors: None       Religious / Personal Beliefs: Holliness       Other: Will I get to see Dr. Evelene Croon again before she leaves?  Pt informed she has an upcoming appointment with Dr. Evelene Croon on 07/03/2019 @ 1:15 and this would be included in her AVS.   SChaplin, RN,BSN       Social Determinants of Health   Financial Resource Strain: Low Risk  (12/05/2021)   Overall Financial Resource Strain (CARDIA)    Difficulty of Paying Living Expenses: Not very hard  Food Insecurity: Food Insecurity Present (12/05/2021)   Hunger Vital Sign    Worried  About Running Out of Food in the Last Year: Sometimes true    Ran Out of Food in the Last Year: Sometimes true  Transportation Needs: No Transportation Needs (12/05/2021)   PRAPARE - Administrator, Civil Service (Medical): No    Lack of Transportation (Non-Medical): No  Physical Activity: Inactive (12/05/2021)   Exercise Vital Sign    Days of Exercise per Week: 1 day    Minutes of Exercise per Session: 0 min  Stress: No Stress Concern Present (12/05/2021)   [employer] of Occupational Health - Occupational Stress Questionnaire    Feeling of Stress : Only a little  Social Connections: Socially Isolated (12/05/2021)   Social Connection and Isolation Panel [NHANES]    Frequency of Communication with Friends and Family: More than three times a week    Frequency of Social Gatherings with Friends and Family: Once a week    Attends Religious Services: Never    Database administrator or Organizations: No    Attends Banker Meetings: Never    Marital Status: Divorced  Catering manager Violence: Not At Risk (12/05/2021)   Humiliation, Afraid, Rape, and Kick questionnaire    Fear of Current or Ex-Partner: No    Emotionally Abused: No    Physically Abused: No    Sexually Abused: No    No Known Allergies  Current Outpatient Medications  Medication Sig Dispense Refill   alendronate (FOSAMAX) 70 MG tablet Take 1 tablet (70 mg total) by mouth every 7 (seven) days. Take with a full glass of water on an empty stomach preferably in the morning. Do not chew the tablet. Do not lie down or eat anything for one hour after taking this medication. Do not take any vitamins with this medication. 4 tablet 11   aspirin EC 81 MG tablet Take 1 tablet (81 mg total) by mouth daily. Swallow whole. 150 tablet 2   cilostazol (PLETAL) 100 MG tablet Take 1 tablet (100 mg total) by mouth 2 (two) times daily before a meal. 60 tablet 11   hydrochlorothiazide (HYDRODIURIL) 25 MG tablet Take  1 tablet (25 mg total) by mouth daily. 90 tablet 1   pantoprazole (PROTONIX) 40 MG tablet TAKE 1 TABLET BY MOUTH DAILY  BEFORE BREAKFAST 100 tablet 2  rosuvastatin (CRESTOR) 40 MG tablet Take 1 tablet (40 mg total) by mouth daily. 90 tablet 1   diltiazem (CARDIZEM CD) 240 MG 24 hr capsule Take 1 capsule (240 mg total) by mouth daily. 30 capsule 2   losartan (COZAAR) 100 MG tablet Take 1 tablet (100 mg total) by mouth daily. 30 tablet 2   varenicline (CHANTIX) 0.5 MG tablet 0.5 mg orally once daily for 3 days, then 0.5 mg twice daily on days 4 through 7, and then 1 mg twice daily thereafter for 12 weeks of treatment (Patient not taking: Reported on 07/07/2022) 55 tablet 0   No current facility-administered medications for this visit.    REVIEW OF SYSTEMS:  [X]  denotes positive finding, [ ]  denotes negative finding Cardiac  Comments:  Chest pain or chest pressure:    Shortness of breath upon exertion:    Short of breath when lying flat:    Irregular heart rhythm:        Vascular    Pain in calf, thigh, or hip brought on by ambulation: x right  Pain in feet at night that wakes you up from your sleep:     Blood clot in your veins:    Leg swelling:         Pulmonary    Oxygen at home:    Productive cough:     Wheezing:         Neurologic    Sudden weakness in arms or legs:     Sudden numbness in arms or legs:     Sudden onset of difficulty speaking or slurred speech:    Temporary loss of vision in one eye:     Problems with dizziness:         Gastrointestinal    Blood in stool:     Vomited blood:         Genitourinary    Burning when urinating:     Blood in urine:        Psychiatric    Major depression:         Hematologic    Bleeding problems:    Problems with blood clotting too easily:        Skin    Rashes or ulcers:        Constitutional    Fever or chills:      PHYSICAL EXAM: Vitals:   07/07/22 1454  BP: (!) 189/95  Pulse: 76  Resp: 14  Temp: 97.8 F  (36.6 C)  TempSrc: Temporal  SpO2: 94%  Weight: 133 lb (60.3 kg)  Height: 5' 2.5" (1.588 m)    GENERAL: The patient is a well-nourished female, in no acute distress. The vital signs are documented above. CARDIAC: There is a regular rate and rhythm.  VASCULAR:  Left femoral pulse 2+ palpable  Right femoral pulse weakly palpable Weakly palpable DP bilaterally  No lower extremity tissue loss PULMONARY: No respiratory distress. ABDOMEN: Soft and non-tender. MUSCULOSKELETAL: There are no major deformities or cyanosis. NEUROLOGIC: No focal weakness or paresthesias are detected. PSYCHIATRIC: The patient has a normal affect.  DATA:   ABIs 12/15/21 were 0.92 on the right biphasic and 0.92 on the left biphasic - evidence of mild peripheral arterial disease   Assessment/Plan:  77 year old female presents for 60-month interval follow-up of her PAD.  Previously she was walking about 10 to 15 minutes before she got lower extremity discomfort.  We have previously elected to manage this conservatively.  Since last evaluation she is  having more cramping in the right lower extremity after much shorter distances of only 200 feet or so.  That being said she is still doing most all of her activities of daily living including working around the house and shopping.  I discussed for claudication we should reserve intervention until this becomes disabling.  I discussed she really needs to focus on smoking cessation.  She needs to continue walking therapies.  I did refill her Pletal.  I will see her in 6 months with repeat ABIs.  Certainly if this becomes more disabling we can plan lower extremity arteriogram with a focus on the right leg.   Cephus Shelling, MD [facility] Office: [phone]

## 2022-07-20 ENCOUNTER — Other Ambulatory Visit: Payer: Self-pay

## 2022-07-20 DIAGNOSIS — I739 Peripheral vascular disease, unspecified: Secondary | ICD-10-CM

## 2022-09-02 ENCOUNTER — Other Ambulatory Visit: Payer: Self-pay | Admitting: Internal Medicine

## 2022-09-02 DIAGNOSIS — I10 Essential (primary) hypertension: Secondary | ICD-10-CM

## 2022-09-06 NOTE — Progress Notes (Signed)
   Established Patient Office Visit  Subjective   Patient ID: Kylie Torres, female    DOB: 23-Oct-1945  Age: 77 y.o. MRN: [id number]  No chief complaint on file.   Kylie Torres is a 77 year old female  {History (Optional):23778}  ROS    Objective:     There were no vitals taken for this visit. {Vitals History (Optional):23777}  Physical Exam   No results found for any visits on 09/07/22.  {Labs (Optional):23779}  The ASCVD Risk score (Arnett DK, et al., 2019) failed to calculate for the following reasons:   The valid total cholesterol range is 130 to 320 mg/dL    Assessment & Plan:   Problem List Items Addressed This Visit   None   No follow-ups on file.    Faith Rogue, DO

## 2022-09-07 ENCOUNTER — Other Ambulatory Visit: Payer: Self-pay

## 2022-09-07 ENCOUNTER — Encounter: Payer: Self-pay | Admitting: Student

## 2022-09-07 ENCOUNTER — Ambulatory Visit: Payer: 59 | Admitting: Student

## 2022-09-07 VITALS — BP 165/77 | HR 78 | Ht 62.0 in | Wt 130.8 lb

## 2022-09-07 DIAGNOSIS — E559 Vitamin D deficiency, unspecified: Secondary | ICD-10-CM | POA: Diagnosis not present

## 2022-09-07 DIAGNOSIS — I10 Essential (primary) hypertension: Secondary | ICD-10-CM

## 2022-09-07 DIAGNOSIS — E785 Hyperlipidemia, unspecified: Secondary | ICD-10-CM

## 2022-09-07 DIAGNOSIS — I739 Peripheral vascular disease, unspecified: Secondary | ICD-10-CM | POA: Diagnosis not present

## 2022-09-07 DIAGNOSIS — E782 Mixed hyperlipidemia: Secondary | ICD-10-CM

## 2022-09-07 DIAGNOSIS — M79645 Pain in left finger(s): Secondary | ICD-10-CM | POA: Diagnosis not present

## 2022-09-07 DIAGNOSIS — R011 Cardiac murmur, unspecified: Secondary | ICD-10-CM | POA: Diagnosis not present

## 2022-09-07 DIAGNOSIS — F17210 Nicotine dependence, cigarettes, uncomplicated: Secondary | ICD-10-CM | POA: Diagnosis not present

## 2022-09-07 DIAGNOSIS — M79641 Pain in right hand: Secondary | ICD-10-CM

## 2022-09-07 DIAGNOSIS — R911 Solitary pulmonary nodule: Secondary | ICD-10-CM

## 2022-09-07 DIAGNOSIS — M79644 Pain in right finger(s): Secondary | ICD-10-CM

## 2022-09-07 DIAGNOSIS — Z0000 Encounter for general adult medical examination without abnormal findings: Secondary | ICD-10-CM

## 2022-09-07 MED ORDER — VARENICLINE TARTRATE 0.5 MG PO TABS: 0.5000 mg | ORAL_TABLET | Freq: Two times a day (BID) | ORAL | 2 refills | Status: DC

## 2022-09-07 NOTE — Assessment & Plan Note (Signed)
Patient is due for a shingles vaccine, influenza vaccine, COVID-19 vaccine. Patient will also need a repeat chest CT in September due to the finding of a 7.2 mm nodule, patient still smokes half a pack a day. PLAN:  -Repeat CT scan in September, ordered -Patient will need to go to pharmacy for the COVID19 and shingrix vaccines, influenza vaccine next visit

## 2022-09-07 NOTE — Assessment & Plan Note (Signed)
Patient presents with a history of uncontrolled hypertension due to medication nonadherence.  Today, her blood pressure is 165/77.  Patient reports only taking 1 blood pressure medication pill which she believes is the Cardizem.  She is also prescribed hydrochlorothiazide 25 mg and losartan 100 mg, she denies taking either these medications.  She denies checking her blood pressure at home. PLAN: -Patient will check blood pressure at least once a day for 2 weeks -If blood pressure readings are consistently elevated at 150/90, will add either hydrochlorothiazide 25 mg or losartan 100 mg -Repeated BMP today -Return to office in 4 weeks

## 2022-09-07 NOTE — Assessment & Plan Note (Signed)
Patient has a history of vitamin D deficiency.  August 2023 vitamin D was measured at 18.1, patient denies ever taking a supplement for the vitamin D deficiency. PLAN: -Recheck vitamin D today -Supplement with vitamin D accordingly

## 2022-09-07 NOTE — Assessment & Plan Note (Signed)
Patient reports bilateral thenar pain for the past 1 year that has progressively worsened.  The pain is worse in the afternoon after she is cooking and cleaning or using her hands in general.  She reports feeling weakness in the hands and felt like she could drop things.  She has not tried any medications, ice, or heat.  Exam today was remarkable for tenderness present at the [facility] joint at the base of the thumb, negative Phalen's test, negative Tinel's test.  PLAN: -bilateral x-rays of the thumbs ordered -Hand surgery referral ordered

## 2022-09-07 NOTE — Assessment & Plan Note (Signed)
Patient has a history of peripheral arterial disease in which she sees vascular surgery for.  She is currently maintained on cilostazol. PLAN: -Continue cilostazol -Continue follow-up with vascular surgery -We discussed smoking cessation today, patient is interested in trying Chantix.  She was prescribed chantix in the past however she did not try it: she denied depressed mood or changes in mood -Chantix prescribed

## 2022-09-07 NOTE — Patient Instructions (Signed)
Please check your blood pressure at least once a day while taking the Cardizem. Give [place] a call in 2 weeks with the blood pressure readings. Please return in one month for a check up on the blood pressure.

## 2022-09-07 NOTE — Assessment & Plan Note (Signed)
She has a longstanding history of a systolic murmur present at the left upper sternal border.  Per patient, she has had this murmur since childhood and has never had chest pain or shortness of breath associated with the murmur.  Physical exam was remarkable for a 3 out of 6 systolic murmur auscultated at the left upper sternal border that decreased with inspiration.  She used to follow with [facility] cardiology, however has not seen them in years.  Prior echocardiogram in 2022 did not show any valvular disease. PLAN: -Discussed following up with [facility] cardiology -Discussed obtaining an echocardiogram at her follow-up appointment in 4 weeks

## 2022-09-07 NOTE — Assessment & Plan Note (Signed)
Patient is currently on Crestor 40 mg for hyperlipidemia.  Previous lipid profile November 2023 showed a LDL of 64, is within goal for a diabetic. PLAN: -Repeat lipid profile in 3 months

## 2022-09-08 NOTE — Progress Notes (Signed)
Tried to call the patient. No answer. Will try tomorrow morning and will send in vitamin D.

## 2022-09-09 NOTE — Progress Notes (Signed)
Attempted to call patient again this morning, will try later.

## 2022-09-10 NOTE — Progress Notes (Signed)
Attempted to call patient again today.  There is not a voicemail box to leave a voicemail.  Will try again tomorrow, if necessary will send a letter tomorrow.

## 2022-09-11 NOTE — Progress Notes (Signed)
Pt did not answer again. Will send letter with recommendations Monday.

## 2022-09-14 ENCOUNTER — Other Ambulatory Visit: Payer: Self-pay | Admitting: Student

## 2022-09-14 ENCOUNTER — Encounter: Payer: Self-pay | Admitting: Student

## 2022-09-14 MED ORDER — VITAMIN D 25 MCG (1000 UNIT) PO TABS: 1000.0000 [IU] | ORAL_TABLET | Freq: Every day | ORAL | 1 refills | Status: DC

## 2022-09-14 NOTE — Progress Notes (Signed)
Patient did not answer numerous phone calls.  Will send vitamin D to pharmacy.  Will send letter and mail with results.

## 2022-09-15 NOTE — Addendum Note (Signed)
Addended by: Debe Coder B on: 09/15/2022 02:55 PM   Modules accepted: Level of Service

## 2022-09-15 NOTE — Progress Notes (Signed)
 Internal Medicine Clinic Attending  I was physically present during the key portions of the resident provided service and participated in the medical decision making of patient's management care. I reviewed pertinent patient test results.  The assessment, diagnosis, and plan were formulated together and I agree with the documentation in the resident's note.  Inez Catalina, MD

## 2022-09-23 ENCOUNTER — Ambulatory Visit (INDEPENDENT_AMBULATORY_CARE_PROVIDER_SITE_OTHER): Payer: 59

## 2022-09-23 VITALS — Ht 62.0 in | Wt 130.0 lb

## 2022-09-23 DIAGNOSIS — Z0000 Encounter for general adult medical examination without abnormal findings: Secondary | ICD-10-CM

## 2022-09-23 NOTE — Progress Notes (Signed)
Subjective:   Kylie Torres is a 77 y.o. female who presents for Medicare Annual (Subsequent) preventive examination.  Visit Complete: Virtual  I connected with  Kylie Torres on 09/23/22 by a audio enabled telemedicine application and verified that I am speaking with the correct person using two identifiers.  Patient Location: Home  Provider Location: Home Office  I discussed the limitations of evaluation and management by telemedicine. The patient expressed understanding and agreed to proceed.  Vital Signs: Because this visit was a virtual/telehealth visit, some criteria may be missing or patient reported. Any vitals not documented were not able to be obtained and vitals that have been documented are patient reported.    Review of Systems    Cardiac Risk Factors include: advanced age (>19men, >44 women);hypertension;Other (see comment);dyslipidemia, Risk factor comments: Cardiomyopathy, PAD, Osteoporosis     Objective:    Today's Vitals   09/23/22 1030  Weight: 130 lb (59 kg)  Height: 5\' 2"  (1.575 m)   Body mass index is 23.78 kg/m.     09/23/2022   10:39 AM 09/07/2022    9:00 AM 03/05/2022    8:54 AM 12/05/2021    9:41 AM 12/05/2021    9:13 AM 09/25/2021    9:06 AM 09/11/2021    1:15 PM  Advanced Directives  Does Patient Have a Medical Advance Directive? No No No No No No No  Would patient like information on creating a medical advance directive? No - Patient declined No - Patient declined No - Patient declined No - Patient declined No - Patient declined Yes (Inpatient - patient requests chaplain consult to create a medical advance directive) No - Patient declined    Current Medications (verified) Outpatient Encounter Medications as of 09/23/2022  Medication Sig   alendronate (FOSAMAX) 70 MG tablet Take 1 tablet (70 mg total) by mouth every 7 (seven) days. Take with a full glass of water on an empty stomach preferably in the morning. Do not chew the tablet. Do not  lie down or eat anything for one hour after taking this medication. Do not take any vitamins with this medication.   aspirin EC 81 MG tablet Take 1 tablet (81 mg total) by mouth daily. Swallow whole.   cholecalciferol (VITAMIN D3) 25 MCG (1000 UNIT) tablet Take 1 tablet (1,000 Units total) by mouth daily.   cilostazol (PLETAL) 100 MG tablet Take 1 tablet (100 mg total) by mouth 2 (two) times daily before a meal.   diltiazem (CARDIZEM CD) 240 MG 24 hr capsule TAKE 1 CAPSULE BY MOUTH DAILY   pantoprazole (PROTONIX) 40 MG tablet TAKE 1 TABLET BY MOUTH DAILY  BEFORE BREAKFAST   varenicline (CHANTIX) 0.5 MG tablet Take 1 tablet (0.5 mg total) by mouth 2 (two) times daily.   hydrochlorothiazide (HYDRODIURIL) 25 MG tablet Take 1 tablet (25 mg total) by mouth daily.   losartan (COZAAR) 100 MG tablet Take 1 tablet (100 mg total) by mouth daily.   rosuvastatin (CRESTOR) 40 MG tablet Take 1 tablet (40 mg total) by mouth daily.   No facility-administered encounter medications on file as of 09/23/2022.    Allergies (verified) Patient has no known allergies.   History: Past Medical History:  Diagnosis Date   Acute pain of left shoulder 07/18/2019   Alcohol abuse    Blood transfusion without reported diagnosis    Cirrhosis (HCC)    Gastric ulcer    Heart murmur    Hypertension    Hypertensive retinopathy  OU   Peripheral arterial disease [facility])    Past Surgical History:  Procedure Laterality Date   ABDOMINAL HYSTERECTOMY     BREAST EXCISIONAL BIOPSY Left 1971   benign   CATARACT EXTRACTION Bilateral    Dr. Dione Booze   COLONOSCOPY  04/29/2021   COLONOSCOPY W/ POLYPECTOMY  03/23/2016   ESOPHAGOGASTRODUODENOSCOPY (EGD) WITH PROPOFOL N/A 04/17/2015   Procedure: ESOPHAGOGASTRODUODENOSCOPY (EGD) WITH PROPOFOL;  Surgeon: Meryl Dare, MD;  Location: [facility];  Service: Endoscopy;  Laterality: N/A;   EYE SURGERY Bilateral    Cat Sx   OOPHORECTOMY     Family History  Problem Relation Age of  Onset   Breast cancer Mother 68   Cancer Mother    Heart attack Father    Hypertension Father    Hypertension Sister    Diabetes Sister    Heart attack Brother    Stomach cancer Maternal Aunt    Breast cancer Maternal Aunt        ? age of onset   Colon cancer Neg Hx    Esophageal cancer Neg Hx    Rectal cancer Neg Hx    Social History   Socioeconomic History   Marital status: Divorced    Spouse name: Not on file   Number of children: 3   Years of education: Not on file   Highest education level: Not on file  Occupational History   Occupation: retired  Tobacco Use   Smoking status: Every Day    Current packs/day: 1.00    Average packs/day: 1 pack/day for 30.1 years (30.1 ttl pk-yrs)    Types: Cigarettes    Start date: 01/27/2019   Smokeless tobacco: Never   Tobacco comments:    Smoked cigarette this am 7:30 ish  Vaping Use   Vaping status: Never Used  Substance and Sexual Activity   Alcohol use: No    Comment: quit drinking in 03/2015 - previously 1/5 gin every 2 days   Drug use: No   Sexual activity: Not on file  Other Topics Concern   Not on file  Social History Narrative   Current Social History 05/18/2019        Patient lives alone in an apartment which is 2 stories. There are steps with hand rails which the patient uses.       Patient's method of transportation is via her daughter.      The highest level of education was high school diploma.      The patient currently retired.      Identified important Relationships are with her daughter, a good friend who calls her daily, and a neighbor.      Pets : No       Interests / Fun: Watching TV and reading about various subjects       Current Stressors: None       Religious / Personal Beliefs: Holliness       Other: Will I get to see Dr. Evelene Croon again before she leaves?  Pt informed she has an upcoming appointment with Dr. Evelene Croon on 07/03/2019 @ 1:15 and this would be included in her AVS.   SChaplin, RN,BSN        Social Determinants of Health   Financial Resource Strain: Low Risk  (09/23/2022)   Overall Financial Resource Strain (CARDIA)    Difficulty of Paying Living Expenses: Not hard at all  Food Insecurity: No Food Insecurity (09/23/2022)   Hunger Vital Sign    Worried About Running Out  of Food in the Last Year: Never true    Ran Out of Food in the Last Year: Never true  Transportation Needs: No Transportation Needs (09/23/2022)   PRAPARE - Administrator, Civil Service (Medical): No    Lack of Transportation (Non-Medical): No  Physical Activity: Inactive (09/23/2022)   Exercise Vital Sign    Days of Exercise per Week: 0 days    Minutes of Exercise per Session: 0 min  Stress: No Stress Concern Present (09/23/2022)   [employer] of Occupational Health - Occupational Stress Questionnaire    Feeling of Stress : Not at all  Social Connections: Socially Isolated (09/23/2022)   Social Connection and Isolation Panel [NHANES]    Frequency of Communication with Friends and Family: More than three times a week    Frequency of Social Gatherings with Friends and Family: Never    Attends Religious Services: Never    Database administrator or Organizations: No    Attends Engineer, structural: Never    Marital Status: Divorced    Tobacco Counseling Ready to quit: Not Answered Counseling given: Not Answered Tobacco comments: Smoked cigarette this am 7:30 ish   Clinical Intake:  Pre-visit preparation completed: Yes  Pain : No/denies pain     BMI - recorded: 23.78 Nutritional Risks: None Diabetes: No  How often do you need to have someone help you when you read instructions, pamphlets, or other written materials from your doctor or pharmacy?: 1 - Never  Interpreter Needed?: No  Information entered by :: Hosea Hanawalt, RMA   Activities of Daily Living    09/23/2022   10:36 AM 03/05/2022    8:54 AM  In your present state of health, do you have any difficulty  performing the following activities:  Hearing? 0 0  Vision? 0 0  Difficulty concentrating or making decisions? 0 0  Walking or climbing stairs? 0 0  Dressing or bathing? 0 0  Doing errands, shopping? 1 1  Comment Daughter drives her family Insurance claims handler and eating ? N   Using the Toilet? N   In the past six months, have you accidently leaked urine? N   Do you have problems with loss of bowel control? N   Managing your Medications? N   Managing your Finances? N   Housekeeping or managing your Housekeeping? N     Patient Care Team: Faith Rogue, DO as PCP - General  Indicate any recent Medical Services you may have received from other than [facility] providers in the past year (date may be approximate).     Assessment:   This is a routine wellness examination for Yarixa.  Hearing/Vision screen Hearing Screening - Comments:: Denies hearing difficulties   Vision Screening - Comments:: Denies vision issues.  Dietary issues and exercise activities discussed:     Goals Addressed               This Visit's Progress     Patient Stated (pt-stated)        Would like to stop smoking      Depression Screen    09/23/2022   10:45 AM 09/07/2022    9:00 AM 03/05/2022    9:03 AM 12/05/2021    9:42 AM 12/05/2021    9:13 AM 09/25/2021    9:06 AM 09/11/2021    1:15 PM  PHQ 2/9 Scores  PHQ - 2 Score 0 0 0 0 0 0 0  PHQ- 9 Score 1 0  Fall Risk    09/23/2022   10:40 AM 09/07/2022    9:00 AM 03/05/2022    8:53 AM 12/05/2021    9:41 AM 12/05/2021    9:13 AM  Fall Risk   Falls in the past year? 0 0 0 0 0  Number falls in past yr: 0  0 0 0  Injury with Fall? 0  0 0 0  Risk for fall due to : No Fall Risks No Fall Risks No Fall Risks No Fall Risks No Fall Risks  Follow up Falls prevention discussed;Falls evaluation completed Falls evaluation completed  Falls evaluation completed;Falls prevention discussed Falls evaluation completed;Falls prevention discussed    MEDICARE  RISK AT HOME: Medicare Risk at Home Any stairs in or around the home?: Yes If so, are there any without handrails?: Yes Home free of loose throw rugs in walkways, pet beds, electrical cords, etc?: Yes Adequate lighting in your home to reduce risk of falls?: Yes Life alert?: Yes Use of a cane, walker or w/c?: No Grab bars in the bathroom?: Yes Shower chair or bench in shower?: Yes Elevated toilet seat or a handicapped toilet?: Yes  TIMED UP AND GO:  Was the test performed?  No    Cognitive Function:        09/23/2022   10:41 AM 12/05/2021    9:42 AM  6CIT Screen  What Year? 0 points 0 points  What month? 0 points 0 points  What time? 0 points 0 points  Count back from 20 2 points 0 points  Months in reverse 4 points 0 points  Repeat phrase 10 points 0 points  Total Score 16 points 0 points    Immunizations Immunization History  Administered Date(s) Administered   Fluad Quad(high Dose 65+) 12/05/2021   Influenza,inj,Quad PF,6+ Mos 12/04/2015, 10/28/2016, 11/12/2017, 12/13/2019   Influenza-Unspecified 10/10/2020   Pneumococcal Conjugate-13 12/04/2015   Pneumococcal Polysaccharide-23 10/28/2016   Tdap 12/04/2015    TDAP status: Up to date  Flu Vaccine status: Due, Education has been provided regarding the importance of this vaccine. Advised may receive this vaccine at local pharmacy or Health Dept. Aware to provide a copy of the vaccination record if obtained from local pharmacy or Health Dept. Verbalized acceptance and understanding.  Pneumococcal vaccine status: Up to date  Covid-19 vaccine status: Declined, Education has been provided regarding the importance of this vaccine but patient still declined. Advised may receive this vaccine at local pharmacy or Health Dept.or vaccine clinic. Aware to provide a copy of the vaccination record if obtained from local pharmacy or Health Dept. Verbalized acceptance and understanding.  Qualifies for Shingles Vaccine? Yes    Zostavax completed No   Shingrix Completed?: No.    Education has been provided regarding the importance of this vaccine. Patient has been advised to call insurance company to determine out of pocket expense if they have not yet received this vaccine. Advised may also receive vaccine at local pharmacy or Health Dept. Verbalized acceptance and understanding.  Screening Tests Health Maintenance  Topic Date Due   Zoster Vaccines- Shingrix (1 of 2) Never done   COVID-19 Vaccine (1 - 2023-24 season) Never done   INFLUENZA VACCINE  08/27/2022   Lung Cancer Screening  04/13/2023   Medicare Annual Wellness (AWV)  09/23/2023   DTaP/Tdap/Td (2 - Td or Tdap) 12/03/2025   Pneumonia Vaccine 56+ Years old  Completed   DEXA SCAN  Completed   Hepatitis C Screening  Completed   HPV VACCINES  Aged Out   Colonoscopy  Discontinued    Health Maintenance  Health Maintenance Due  Topic Date Due   Zoster Vaccines- Shingrix (1 of 2) Never done   COVID-19 Vaccine (1 - 2023-24 season) Never done   INFLUENZA VACCINE  08/27/2022    Colorectal cancer screening: No longer required.   Mammogram status: Completed 01/30/2022. Repeat every year  Bone Density status: Completed 09/14/2019. Results reflect: Bone density results: OSTEOPOROSIS. Repeat every 2 years.  Lung Cancer Screening: (Low Dose CT Chest recommended if Age 16-80 years, 20 pack-year currently smoking OR have quit w/in 15years.) does qualify.   Lung Cancer Screening Referral: screening done on 04/13/2022  Additional Screening:  Hepatitis C Screening: does qualify; Completed 12/04/2015  Vision Screening: Recommended annual ophthalmology exams for early detection of glaucoma and other disorders of the eye. Is the patient up to date with their annual eye exam?  No  Who is the provider or what is the name of the office in which the patient attends annual eye exams? N/A If pt is not established with a provider, would they like to be referred to a  provider to establish care? Yes .   Dental Screening: Recommended annual dental exams for proper oral hygiene  Community Resource Referral / Chronic Care Management: CRR required this visit?  No   CCM required this visit?  No     Plan:     I have personally reviewed and noted the following in the patient's chart:   Medical and social history Use of alcohol, tobacco or illicit drugs  Current medications and supplements including opioid prescriptions. Patient is not currently taking opioid prescriptions. Functional ability and status Nutritional status Physical activity Advanced directives List of other physicians Hospitalizations, surgeries, and ER visits in previous 12 months Vitals Screenings to include cognitive, depression, and falls Referrals and appointments  In addition, I have reviewed and discussed with patient certain preventive protocols, quality metrics, and best practice recommendations. A written personalized care plan for preventive services as well as general preventive health recommendations were provided to patient.     Virgen Belland L Javis Abboud, CMA   09/23/2022   After Visit Summary: (Mail) Due to this being a telephonic visit, the after visit summary with patients personalized plan was offered to patient via mail   Nurse Notes: Patient is due for an annual eye exam and needs a recommendation for an eye doctor.  She is also due for a Shingrix vaccine and is aware that she can get that done at the pharmacy.  Patient had no other concerns today.

## 2022-09-23 NOTE — Patient Instructions (Signed)
Kylie Torres , Thank you for taking time to come for your Medicare Wellness Visit. I appreciate your ongoing commitment to your health goals. Please review the following plan we discussed and let me know if I can assist you in the future.   Referrals/Orders/Follow-Ups/Clinician Recommendations: You are due for a Shingles vaccine and a annual eye exam.  It was nice talking to you today.  Keep up the good work.  This is a list of the screening recommended for you and due dates:  Health Maintenance  Topic Date Due   Zoster (Shingles) Vaccine (1 of 2) Never done   COVID-19 Vaccine (1 - 2023-24 season) Never done   Flu Shot  08/27/2022   Screening for Lung Cancer  04/13/2023   Medicare Annual Wellness Visit  09/23/2023   DTaP/Tdap/Td vaccine (2 - Td or Tdap) 12/03/2025   Pneumonia Vaccine  Completed   DEXA scan (bone density measurement)  Completed   Hepatitis C Screening  Completed   HPV Vaccine  Aged Out   Colon Cancer Screening  Discontinued    Advanced directives: (Copy Requested) Please bring a copy of your health care power of attorney and living will to the office to be added to your chart at your convenience.  Next Medicare Annual Wellness Visit scheduled for next year: Yes

## 2022-10-08 ENCOUNTER — Ambulatory Visit (HOSPITAL_COMMUNITY): Payer: 59

## 2022-10-09 DIAGNOSIS — M180 Bilateral primary osteoarthritis of first carpometacarpal joints: Secondary | ICD-10-CM | POA: Diagnosis not present

## 2022-10-09 DIAGNOSIS — M79641 Pain in right hand: Secondary | ICD-10-CM | POA: Diagnosis not present

## 2022-10-09 DIAGNOSIS — M79642 Pain in left hand: Secondary | ICD-10-CM | POA: Diagnosis not present

## 2022-10-09 DIAGNOSIS — M19131 Post-traumatic osteoarthritis, right wrist: Secondary | ICD-10-CM | POA: Diagnosis not present

## 2022-10-09 DIAGNOSIS — M19132 Post-traumatic osteoarthritis, left wrist: Secondary | ICD-10-CM | POA: Diagnosis not present

## 2022-10-16 ENCOUNTER — Ambulatory Visit (HOSPITAL_COMMUNITY): Payer: 59

## 2022-10-20 ENCOUNTER — Other Ambulatory Visit: Payer: Self-pay | Admitting: Internal Medicine

## 2022-10-22 ENCOUNTER — Ambulatory Visit (HOSPITAL_COMMUNITY)
Admission: RE | Admit: 2022-10-22 | Discharge: 2022-10-22 | Disposition: A | Discharge status: Home / Self Care | Payer: 59 | Source: Ambulatory Visit | Attending: Internal Medicine | Admitting: Internal Medicine

## 2022-10-22 DIAGNOSIS — F17210 Nicotine dependence, cigarettes, uncomplicated: Secondary | ICD-10-CM | POA: Diagnosis not present

## 2022-10-22 DIAGNOSIS — R911 Solitary pulmonary nodule: Secondary | ICD-10-CM | POA: Diagnosis not present

## 2022-11-12 ENCOUNTER — Other Ambulatory Visit: Payer: Self-pay | Admitting: Student

## 2022-11-12 DIAGNOSIS — I10 Essential (primary) hypertension: Secondary | ICD-10-CM

## 2022-11-30 ENCOUNTER — Emergency Department (HOSPITAL_BASED_OUTPATIENT_CLINIC_OR_DEPARTMENT_OTHER): Payer: 59

## 2022-11-30 ENCOUNTER — Telehealth: Payer: Self-pay | Admitting: *Deleted

## 2022-11-30 ENCOUNTER — Other Ambulatory Visit: Payer: Self-pay

## 2022-11-30 ENCOUNTER — Emergency Department (HOSPITAL_BASED_OUTPATIENT_CLINIC_OR_DEPARTMENT_OTHER)
Admission: EM | Admit: 2022-11-30 | Discharge: 2022-11-30 | Disposition: A | Discharge status: Home / Self Care | Payer: 59 | Attending: Emergency Medicine | Admitting: Emergency Medicine

## 2022-11-30 DIAGNOSIS — R0602 Shortness of breath: Secondary | ICD-10-CM | POA: Insufficient documentation

## 2022-11-30 DIAGNOSIS — I110 Hypertensive heart disease with heart failure: Secondary | ICD-10-CM | POA: Insufficient documentation

## 2022-11-30 DIAGNOSIS — R918 Other nonspecific abnormal finding of lung field: Secondary | ICD-10-CM | POA: Diagnosis not present

## 2022-11-30 DIAGNOSIS — Z859 Personal history of malignant neoplasm, unspecified: Secondary | ICD-10-CM | POA: Diagnosis not present

## 2022-11-30 DIAGNOSIS — Z7982 Long term (current) use of aspirin: Secondary | ICD-10-CM | POA: Diagnosis not present

## 2022-11-30 DIAGNOSIS — I509 Heart failure, unspecified: Secondary | ICD-10-CM | POA: Diagnosis not present

## 2022-11-30 DIAGNOSIS — I517 Cardiomegaly: Secondary | ICD-10-CM | POA: Diagnosis not present

## 2022-11-30 DIAGNOSIS — J439 Emphysema, unspecified: Secondary | ICD-10-CM | POA: Diagnosis not present

## 2022-11-30 DIAGNOSIS — Z79899 Other long term (current) drug therapy: Secondary | ICD-10-CM | POA: Insufficient documentation

## 2022-11-30 DIAGNOSIS — R7989 Other specified abnormal findings of blood chemistry: Secondary | ICD-10-CM | POA: Insufficient documentation

## 2022-11-30 LAB — BASIC METABOLIC PANEL
Anion gap: 7 (ref 5–15)
BUN: 19 mg/dL (ref 8–23)
CO2: 22 mmol/L (ref 22–32)
Calcium: 10.2 mg/dL (ref 8.9–10.3)
Chloride: 111 mmol/L (ref 98–111)
Creatinine, Ser: 1.28 mg/dL — ABNORMAL HIGH (ref 0.44–1.00)
GFR, Estimated: 44 mL/min — ABNORMAL LOW (ref >60)
Glucose, Bld: 107 mg/dL — ABNORMAL HIGH (ref 70–99)
Potassium: 4.6 mmol/L (ref 3.5–5.1)
Sodium: 140 mmol/L (ref 135–145)

## 2022-11-30 LAB — CBC
HCT: 39.7 % (ref 36.0–46.0)
Hemoglobin: 13.3 g/dL (ref 12.0–15.0)
MCH: 30 pg (ref 26.0–34.0)
MCHC: 33.5 g/dL (ref 30.0–36.0)
MCV: 89.6 fL (ref 80.0–100.0)
Platelets: 206 10*3/uL (ref 150–400)
RBC: 4.43 MIL/uL (ref 3.87–5.11)
RDW: 14.8 % (ref 11.5–15.5)
WBC: 8.9 10*3/uL (ref 4.0–10.5)
nRBC: 0 % (ref 0.0–0.2)

## 2022-11-30 LAB — TROPONIN I (HIGH SENSITIVITY): Troponin I (High Sensitivity): <2 ng/L (ref <18)

## 2022-11-30 LAB — BRAIN NATRIURETIC PEPTIDE: B Natriuretic Peptide: 562.8 pg/mL — ABNORMAL HIGH (ref 0.0–100.0)

## 2022-11-30 NOTE — ED Notes (Signed)
Pt's o2 prior to walking was 97%. While walking o2 stayed at 96-97%. HR 80's.

## 2022-11-30 NOTE — ED Provider Notes (Signed)
[facility] Provider Note   CSN: [id number] Arrival date & time: 11/30/22  1450     History  Chief Complaint  Patient presents with   Shortness of Breath    Kylie Torres is a 77 y.o. female with PMHx HTN, alcohol use disorder, cirrhosis, PAD, cardiomegaly who presents to ED concerned for dry cough and DOE x3 weeks. Denies weight gain, leg swelling. States that she quit smoking around 1 month ago.  Denies recent surgery/immobilization, hx DVT/PE, hemoptysis, hx cancer in the past 6 months, calf swelling/tenderness. Denies fever, chest pain, nausea, vomiting, diarrhea.   Shortness of Breath      Home Medications Prior to Admission medications   Medication Sig Start Date End Date Taking? Authorizing Provider  aspirin EC 81 MG tablet Take 1 tablet (81 mg total) by mouth daily. Swallow whole. 03/05/22 03/05/23  Adron Bene, MD  CARTIA XT 240 MG 24 hr capsule TAKE 1 CAPSULE BY MOUTH DAILY 11/13/22   Marrianne Mood, MD  cholecalciferol (VITAMIN D3) 25 MCG (1000 UNIT) tablet Take 1 tablet (1,000 Units total) by mouth daily. 09/14/22   Faith Rogue, DO  cilostazol (PLETAL) 100 MG tablet Take 1 tablet (100 mg total) by mouth 2 (two) times daily before a meal. 07/07/22   Cephus Shelling, MD  hydrochlorothiazide (HYDRODIURIL) 25 MG tablet Take 1 tablet (25 mg total) by mouth daily. 03/05/22 09/01/22  Adron Bene, MD  losartan (COZAAR) 100 MG tablet Take 1 tablet (100 mg total) by mouth daily. 03/05/22 06/03/22  Adron Bene, MD  pantoprazole (PROTONIX) 40 MG tablet TAKE 1 TABLET BY MOUTH DAILY  BEFORE BREAKFAST 10/24/22   Faith Rogue, DO  rosuvastatin (CRESTOR) 20 MG tablet TAKE 1 TABLET BY MOUTH DAILY 10/24/22   Faith Rogue, DO  varenicline (CHANTIX) 0.5 MG tablet Take 1 tablet (0.5 mg total) by mouth 2 (two) times daily. 09/07/22   Faith Rogue, DO      Allergies    Patient has no known allergies.    Review of Systems   Review  of Systems  Respiratory:  Positive for shortness of breath.     Physical Exam Updated Vital Signs BP (!) 167/72   Pulse 64   Temp 98 F (36.7 C)   Resp 18   Ht 5\' 2"  (1.575 m)   Wt 61.2 kg   SpO2 95%   BMI 24.69 kg/m  Physical Exam Vitals and nursing note reviewed.  Constitutional:      General: She is not in acute distress.    Appearance: She is not ill-appearing, toxic-appearing or diaphoretic.  HENT:     Head: Normocephalic and atraumatic.     Mouth/Throat:     Mouth: Mucous membranes are moist.     Pharynx: No oropharyngeal exudate or posterior oropharyngeal erythema.  Eyes:     General: No scleral icterus.       Right eye: No discharge.        Left eye: No discharge.     Conjunctiva/sclera: Conjunctivae normal.  Neck:     Vascular: No JVD.  Cardiovascular:     Rate and Rhythm: Normal rate and regular rhythm.     Pulses: Normal pulses.     Heart sounds: Normal heart sounds. No murmur heard. Pulmonary:     Effort: Pulmonary effort is normal. No respiratory distress.     Breath sounds: Normal breath sounds. No wheezing, rhonchi or rales.  Abdominal:     General: Bowel sounds  are normal.     Palpations: Abdomen is soft. There is no mass.     Tenderness: There is no abdominal tenderness.  Musculoskeletal:     Right lower leg: No edema.     Left lower leg: No edema.     Comments: No pitting edema or calf tenderness to palpation  Skin:    General: Skin is warm and dry.     Findings: No rash.  Neurological:     General: No focal deficit present.     Mental Status: She is alert. Mental status is at baseline.  Psychiatric:        Mood and Affect: Mood normal.        Behavior: Behavior normal.     ED Results / Procedures / Treatments   Labs (all labs ordered are listed, but only abnormal results are displayed) Labs Reviewed  BASIC METABOLIC PANEL - Abnormal; Notable for the following components:      Result Value   Glucose, Bld 107 (*)    Creatinine, Ser  1.28 (*)    GFR, Estimated 44 (*)    All other components within normal limits  BRAIN NATRIURETIC PEPTIDE - Abnormal; Notable for the following components:   B Natriuretic Peptide 562.8 (*)    All other components within normal limits  CBC  TROPONIN I (HIGH SENSITIVITY)    EKG None  Radiology DG Chest Port 1 View  Result Date: 11/30/2022 CLINICAL DATA:  Shortness of breath. EXAM: PORTABLE CHEST 1 VIEW COMPARISON:  Radiograph 04/16/2015, chest CT 10/22/2022 FINDINGS: Chronic cardiomegaly. Normal mediastinal contours. Emphysema with bronchial thickening. There may be small bilateral pleural effusions. No confluent consolidation. No pneumothorax. Pulmonary nodules on prior CT of no radiographic correlate. Thoracic spondylosis with spurring. IMPRESSION: 1. Emphysema with bronchial thickening. 2. Chronic cardiomegaly. 3. Possible small bilateral pleural effusions. Electronically Signed   By: Narda Rutherford M.D.   On: 11/30/2022 18:07    Procedures Procedures    Medications Ordered in ED Medications - No data to display  ED Course/ Medical Decision Making/ A&P                                 Medical Decision Making Amount and/or Complexity of Data Reviewed Labs: ordered. Radiology: ordered.   This patient presents to the ED for concern of shortness of breath, this involves an extensive number of treatment options, and is a complaint that carries with it a high risk of complications and morbidity.  The differential diagnosis includes Anxiety, Anaphylaxis/Angioedema, Aspirated FB, Arrhythmia, CHF, Asthma, COPD, PNA, COVID/Flu/RSV, STEMI, Tamponade, TPNX, Sepsis   Co morbidities that complicate the patient evaluation  HTN, alcohol use disorder, cirrhosis, PAD, cardiomegaly   Additional history obtained:  Additional history obtained from 2019 Cardiology note:  Echocardiogram 05/19/2017: 1. Left ventricle cavity is normal in size. Severe concentric hypertrophy of the left  ventricle. Normal global wall motion. Peak LVOY gradient- 9 mm of Hg. Visual EF is 65-70%. 2. Left atrial cavity is mildly dilated. 3. Mild (Grade I) mitral regurgitation. Mild calcification of the mitral valve annulus. 4. Mild to moderate tricuspid regurgitation. Mild pulmonary hypertension with approx. PA syst. Pressure of 40 mm of Hg.    Lab Tests:  I Ordered, and personally interpreted labs.  The pertinent results include:   - BMP: Creatinine mildly elevated at 1.28 - BNP: 562 - Trop: within normal limits - CBC: No concern for anemia or  leukocytosis   Imaging Studies ordered:  I ordered imaging studies including  - chest xray: to assess for process contributing to patient's symptoms I independently visualized and interpreted imaging  I agree with the radiologist interpretation   Cardiac Monitoring: / EKG:  The patient was maintained on a cardiac monitor.  I personally viewed and interpreted the cardiac monitored which showed an underlying rhythm of: sinus rhythm without acute ST changes or arrhythmias   Problem List / ED Course / Critical interventions / Medication management  Patient presents to ED concerned with dry cough and DOE x3 weeks. Quit smoking around 1.5 month ago. Denies weight gain/leg swelling. Physical exam without concern for leg swelling or JVD. Patient ambulated in ED with O2 >96% on RA and HR in the 80's. Wells PE Score 0. CBC without leukocytosis or anemia.  BMP with mild elevation in creatinine at 1.28.  BNP elevated at 562. Troponin within normal limits.  EKG reassuring.  Chest x-ray showing patient's baseline cardiomegaly and possible small/mild BL pleural effusions.  Overall patient's physical exam is reassuring that she is not in fluid overload CHF exacerbation. Shared results with patient and family member at bedside.  Recommend following up with cardiologist.  Patient verbalized understanding of plan. Staffed patient with Dr. Elayne Snare. I have reviewed  the patients home medicines and have made adjustments as needed Patient was given return precautions. Patient stable for discharge at this time. Patient verbalized understanding of plan.  DDx: These are considered less likely due to history of present illness and physical exam findings -Aspirated FB: no history of choking -Arrhythmia/STEMI: EKG reassuring -Asthma/COPD/PNA/COVID/Flu/RSV: Lungs clear to auscultation bilaterally and O2 sat 100% on RA -CHF: no physical exam findings -TPNX: Lungs clear to auscultation bilaterally Sepsis: afebrile and other vital signs stable  Risk Stratification Score:  Wells: 0 SIRS: 0   Social Determinants of Health:  none           Final Clinical Impression(s) / ED Diagnoses Final diagnoses:  Congestive heart failure, unspecified HF chronicity, unspecified heart failure type [facility])    Rx / DC Orders ED Discharge Orders     None         Dorthy Cooler, [place] 11/30/22 1822    Royanne Foots, DO 12/08/22 1137

## 2022-11-30 NOTE — ED Notes (Signed)
Patient states she stop smoking about a month ago. Has become SOB about three weeks ago and is not getting better.

## 2022-11-30 NOTE — ED Notes (Signed)
Discharge paperwork given and verbally understood. 

## 2022-11-30 NOTE — Telephone Encounter (Signed)
Call from patient's daughter states patient stopped smoking about 2 months ago.  Had smoked for years.  Now is having a cough and some shortness of breath when she walks and doe happen when sitting.  Daughter was advised to take patient to an Urgent Care as there are no available appointments in the Clinics un 12/10/2022 of which patient's daugther took for patient.

## 2022-11-30 NOTE — Discharge Instructions (Signed)
It was a pleasure caring for you today. Workup was concerning for heart failure. Please call your cardiologist first thing tomorrow morning for a follow up appointment. Seek emergency care if experiencing any new or worsening symptoms.

## 2022-11-30 NOTE — ED Triage Notes (Signed)
Started chantix beginning of Oct. Has stopped smoking since. Has had cough x3 weeks accompanied by SOB especially with exertion. Afebrile. Dry cough.

## 2022-11-30 NOTE — ED Notes (Signed)
ED Provider at bedside. 

## 2022-12-04 ENCOUNTER — Ambulatory Visit: Payer: 59 | Admitting: Cardiology

## 2022-12-10 ENCOUNTER — Ambulatory Visit: Payer: 59 | Admitting: Student

## 2022-12-10 VITALS — BP 195/89 | HR 70 | Temp 97.8°F | Ht 62.0 in | Wt 142.1 lb

## 2022-12-10 DIAGNOSIS — Z23 Encounter for immunization: Secondary | ICD-10-CM

## 2022-12-10 DIAGNOSIS — J439 Emphysema, unspecified: Secondary | ICD-10-CM | POA: Diagnosis not present

## 2022-12-10 DIAGNOSIS — I10 Essential (primary) hypertension: Secondary | ICD-10-CM | POA: Diagnosis not present

## 2022-12-10 MED ORDER — INCRUSE ELLIPTA 62.5 MCG/ACT IN AEPB: 1.0000 | INHALATION_SPRAY | Freq: Every day | RESPIRATORY_TRACT | 3 refills | Status: AC

## 2022-12-10 MED ORDER — HYDROCHLOROTHIAZIDE 25 MG PO TABS: 25.0000 mg | ORAL_TABLET | Freq: Every day | ORAL | 3 refills | Status: DC

## 2022-12-10 MED ORDER — LOSARTAN POTASSIUM 100 MG PO TABS: 100.0000 mg | ORAL_TABLET | Freq: Every day | ORAL | 3 refills | Status: DC

## 2022-12-10 NOTE — Patient Instructions (Signed)
Thank you so much for coming to the clinic today!   I have sent in that inhaler to the [facility], and the blood pressure medications to the delivery pharmacy.   If you have any questions please feel free to the call the clinic at anytime at [phone]. It was a pleasure seeing you!  Best, Dr. Thomasene Ripple

## 2022-12-11 DIAGNOSIS — J439 Emphysema, unspecified: Secondary | ICD-10-CM | POA: Insufficient documentation

## 2022-12-11 MED ORDER — ALBUTEROL SULFATE HFA 108 (90 BASE) MCG/ACT IN AERS: 2.0000 | INHALATION_SPRAY | Freq: Four times a day (QID) | RESPIRATORY_TRACT | 2 refills | Status: DC | PRN

## 2022-12-11 NOTE — Progress Notes (Signed)
CC: Worsening shortness of breath  HPI:  [name] is a 77 y.o. female living with a history stated below and presents today for worsening shortness of breath. Please see problem based assessment and plan for additional details.  Past Medical History:  Diagnosis Date   Acute pain of left shoulder 07/18/2019   Alcohol abuse    Blood transfusion without reported diagnosis    Cirrhosis (HCC)    Gastric ulcer    Heart murmur    Hypertension    Hypertensive retinopathy    OU   Peripheral arterial disease (HCC)     Current Outpatient Medications on File Prior to Visit  Medication Sig Dispense Refill   aspirin EC 81 MG tablet Take 1 tablet (81 mg total) by mouth daily. Swallow whole. 150 tablet 2   CARTIA XT 240 MG 24 hr capsule TAKE 1 CAPSULE BY MOUTH DAILY 100 capsule 2   cholecalciferol (VITAMIN D3) 25 MCG (1000 UNIT) tablet Take 1 tablet (1,000 Units total) by mouth daily. 90 tablet 1   cilostazol (PLETAL) 100 MG tablet Take 1 tablet (100 mg total) by mouth 2 (two) times daily before a meal. 60 tablet 11   pantoprazole (PROTONIX) 40 MG tablet TAKE 1 TABLET BY MOUTH DAILY  BEFORE BREAKFAST 100 tablet 2   rosuvastatin (CRESTOR) 20 MG tablet TAKE 1 TABLET BY MOUTH DAILY 100 tablet 2   varenicline (CHANTIX) 0.5 MG tablet Take 1 tablet (0.5 mg total) by mouth 2 (two) times daily. 60 tablet 2   No current facility-administered medications on file prior to visit.    Family History  Problem Relation Age of Onset   Breast cancer Mother 68   Cancer Mother    Heart attack Father    Hypertension Father    Hypertension Sister    Diabetes Sister    Heart attack Brother    Stomach cancer Maternal Aunt    Breast cancer Maternal Aunt        ? age of onset   Colon cancer Neg Hx    Esophageal cancer Neg Hx    Rectal cancer Neg Hx     Social History   Socioeconomic History   Marital status: Divorced    Spouse name: Not on file   Number of children: 3   Years of education:  Not on file   Highest education level: Not on file  Occupational History   Occupation: retired  Tobacco Use   Smoking status: Every Day    Current packs/day: 1.00    Average packs/day: 1 pack/day for 30.3 years (30.3 ttl pk-yrs)    Types: Cigarettes    Start date: 01/27/2019   Smokeless tobacco: Never   Tobacco comments:    Smoked cigarette this am 7:30 ish  Vaping Use   Vaping status: Never Used  Substance and Sexual Activity   Alcohol use: No    Comment: quit drinking in 03/2015 - previously 1/5 gin every 2 days   Drug use: No   Sexual activity: Not on file  Other Topics Concern   Not on file  Social History Narrative   Current Social History 05/18/2019        Patient lives alone in an apartment which is 2 stories. There are steps with hand rails which the patient uses.       Patient's method of transportation is via her daughter.      The highest level of education was high school diploma.  The patient currently retired.      Identified important Relationships are with her daughter, a good friend who calls her daily, and a neighbor.      Pets : No       Interests / Fun: Watching TV and reading about various subjects       Current Stressors: None       Religious / Personal Beliefs: Holliness       Other: Will I get to see Dr. Evelene Croon again before she leaves?  Pt informed she has an upcoming appointment with Dr. Evelene Croon on 07/03/2019 @ 1:15 and this would be included in her AVS.   SCha[place]in, RN,BSN       Social Determinants of Health   Financial Resource Strain: Low Risk  (09/23/2022)   Overall Financial Resource Strain (CARDIA)    Difficulty of Paying Living Expenses: Not hard at all  Food Insecurity: No Food Insecurity (09/23/2022)   Hunger Vital Sign    Worried About Running Out of Food in the Last Year: Never true    Ran Out of Food in the Last Year: Never true  Transportation Needs: No Transportation Needs (09/23/2022)   PRAPARE - Scientist, research (physical sciences) (Medical): No    Lack of Transportation (Non-Medical): No  Physical Activity: Inactive (09/23/2022)   Exercise Vital Sign    Days of Exercise per Week: 0 days    Minutes of Exercise per Session: 0 min  Stress: No Stress Concern Present (09/23/2022)   [employer] of Occupational Health - Occupational Stress Questionnaire    Feeling of Stress : Not at all  Social Connections: Socially Isolated (09/23/2022)   Social Connection and Isolation Panel [NHANES]    Frequency of Communication with Friends and Family: More than three times a week    Frequency of Social Gatherings with Friends and Family: Never    Attends Religious Services: Never    Database administrator or Organizations: No    Attends Banker Meetings: Never    Marital Status: Divorced  Catering manager Violence: Patient Unable To Answer (09/23/2022)   Humiliation, Afraid, Rape, and Kick questionnaire    Fear of Current or Ex-Partner: Patient unable to answer    Emotionally Abused: Patient unable to answer    Physically Abused: Patient unable to answer    Sexually Abused: Patient unable to answer    Review of Systems: ROS negative except for what is noted on the assessment and plan.  Vitals:   12/10/22 1550 12/10/22 1621  BP: (!) 186/81 (!) 195/89  Pulse: 70 70  Temp: 97.8 F (36.6 C)   TempSrc: Oral   SpO2: 99%   Weight: 142 lb 1.6 oz (64.5 kg)   Height: 5\' 2"  (1.575 m)     Physical Exam: Constitutional: well-appearing female  in no acute distress Cardiovascular: regular rate and rhythm, no m/r/g Pulmonary/Chest: normal work of breathing on room air, decreased breath sounds in upper and middle lobes bilaterally Abdominal: soft, non-tender, non-distended   Assessment & Plan:   Emphysema of lung (HCC) Patient presents for acute visit regarding worsening shortness of breath.  This been going on for few weeks now, initially started with dyspnea on exertion and now she is short of  breath on rest and with minimal movement.  She was evaluated in the emergency department for this, and chest radiography showed significant emphysema.  She has recently quit smoking as well.  She is not currently on any inhaler  therapy.  On my exam, she does have decreased breath sounds in the upper and middle lobes bilaterally, fortunately no wheezes heard.  Overall, patient symptoms may be the result of worsening emphysema.  Will trial LAMA inhaler for her to use once daily, as well as albuterol for rescue breaths.  Plan: - Incruse Ellipta 62.5 mcg - Albuterol for rescue breaths  HTN (hypertension) Blood pressure significantly elevated today in clinic initially 186/81 on recheck 195/89. Elevated blood pressures could be in the setting acute worsening of shortness of breath. She states that she did take her blood pressure medication, over on chart review they have not been filled since February. Will refill both of them today and re-evaluate at next visit.   Plan:  - Losartan 100mg   - Hydrochlorothiazide 25mg   Patient discussed with Dr. Rockey Situ, M.D. [facility] Internal Medicine, PGY-2 Pager: [phone] Date 12/11/2022 Time 10:02 AM

## 2022-12-11 NOTE — Assessment & Plan Note (Addendum)
Patient presents for acute visit regarding worsening shortness of breath.  This been going on for few weeks now, initially started with dyspnea on exertion and now she is short of breath on rest and with minimal movement.  She was evaluated in the emergency department for this, and chest radiography showed significant emphysema.  She has recently quit smoking as well.  She is not currently on any inhaler therapy.  On my exam, she does have decreased breath sounds in the upper and middle lobes bilaterally, fortunately no wheezes heard.  Overall, patient symptoms may be the result of worsening emphysema.  Will trial LAMA inhaler for her to use once daily, as well as albuterol for rescue breaths.  Plan: - Incruse Ellipta 62.5 mcg - Albuterol for rescue breaths

## 2022-12-11 NOTE — Assessment & Plan Note (Signed)
Blood pressure significantly elevated today in clinic initially 186/81 on recheck 195/89. Elevated blood pressures could be in the setting acute worsening of shortness of breath. She states that she did take her blood pressure medication, over on chart review they have not been filled since February. Will refill both of them today and re-evaluate at next visit.   Plan:  - Losartan 100mg   - Hydrochlorothiazide 25mg 

## 2022-12-14 NOTE — Progress Notes (Signed)
Internal Medicine Clinic Attending  Case discussed with the resident at the time of the visit.  We reviewed the resident's history and exam and pertinent patient test results.  I agree with the assessment, diagnosis, and plan of care documented in the resident's note.  

## 2022-12-14 NOTE — Addendum Note (Signed)
Addended by: Carlynn Purl C on: 12/14/2022 11:57 AM   Modules accepted: Level of Service

## 2022-12-15 ENCOUNTER — Telehealth: Payer: Self-pay

## 2022-12-15 NOTE — Telephone Encounter (Signed)
Transition Care Management Follow-up Telephone Call Date of discharge and from where: [facility] 11/4 How have you been since you were released from the hospital? Patient is following up with PCP Any questions or concerns? No  Items Reviewed: Did the pt receive and understand the discharge instructions provided? Yes  Medications obtained and verified? No  Other? No  Any new allergies since your discharge? No  Dietary orders reviewed? No Do you have support at home? No     Follow up appointments reviewed:  PCP Hospital f/u appt confirmed? Yes  Scheduled to see PCP on 11/04 @ . Specialist Hospital f/u appt confirmed? No Scheduled to see  on  @ . Are transportation arrangements needed? No  If their condition worsens, is the pt aware to call PCP or go to the Emergency Dept.? Yes Was the patient provided with contact information for the PCP's office or ED? Yes Was to pt encouraged to call back with questions or concerns? Yes

## 2023-01-12 ENCOUNTER — Ambulatory Visit: Payer: 59 | Admitting: Vascular Surgery

## 2023-01-12 ENCOUNTER — Ambulatory Visit (HOSPITAL_COMMUNITY): Payer: 59

## 2023-01-14 ENCOUNTER — Ambulatory Visit: Payer: 59 | Attending: Cardiology | Admitting: Cardiology

## 2023-01-14 ENCOUNTER — Encounter: Payer: Self-pay | Admitting: Cardiology

## 2023-01-14 ENCOUNTER — Other Ambulatory Visit (HOSPITAL_COMMUNITY): Payer: Self-pay

## 2023-01-14 VITALS — BP 150/90 | HR 69 | Ht 61.0 in | Wt 142.4 lb

## 2023-01-14 DIAGNOSIS — I422 Other hypertrophic cardiomyopathy: Secondary | ICD-10-CM

## 2023-01-14 DIAGNOSIS — I10 Essential (primary) hypertension: Secondary | ICD-10-CM | POA: Diagnosis not present

## 2023-01-14 DIAGNOSIS — R0609 Other forms of dyspnea: Secondary | ICD-10-CM

## 2023-01-14 MED ORDER — BLOOD PRESSURE MONITOR AUTOMAT DEVI
1.0000 [IU] | Freq: Once | 0 refills | Status: AC
  Filled 2023-01-14: qty 1, fill #0
  Filled 2023-01-14: qty 1, 30d supply, fill #0

## 2023-01-14 MED ORDER — CLONIDINE HCL 0.1 MG PO TABS
0.1000 mg | ORAL_TABLET | ORAL | Status: AC
  Administered 2023-01-14: 0.1 mg via ORAL

## 2023-01-14 MED ORDER — CARVEDILOL 12.5 MG PO TABS: 12.5000 mg | ORAL_TABLET | Freq: Two times a day (BID) | ORAL | 1 refills | Status: DC

## 2023-01-14 NOTE — Patient Instructions (Addendum)
Medication Instructions:  Your physician has recommended you make the following change in your medication:   -Start carvedilol (coreg) 12.5mg  twice daily.  *If you need a refill on your cardiac medications before your next appointment, please call your pharmacy*   Lab Work: Your physician recommends that you have labs drawn today: CMET  If you have labs (blood work) drawn today and your tests are completely normal, you will receive your results only by: MyChart Message (if you have MyChart) OR A paper copy in the mail If you have any lab test that is abnormal or we need to change your treatment, we will call you to review the results.   Testing/Procedures: Your physician has requested that you have an echocardiogram. Echocardiography is a painless test that uses sound waves to create images of your heart. It provides your doctor with information about the size and shape of your heart and how well your heart's chambers and valves are working. This procedure takes approximately one hour. There are no restrictions for this procedure. Please do NOT wear cologne, perfume, aftershave, or lotions (deodorant is allowed). Please arrive 15 minutes prior to your appointment time.  Please note: We ask at that you not bring children with you during ultrasound (echo/ vascular) testing. Due to room size and safety concerns, children are not allowed in the ultrasound rooms during exams. Our front office staff cannot provide observation of children in our lobby area while testing is being conducted. An adult accompanying a patient to their appointment will only be allowed in the ultrasound room at the discretion of the ultrasound technician under special circumstances. We apologize for any inconvenience.    Follow-Up: At [facility], you and your health needs are our priority.  As part of our continuing mission to provide you with exceptional heart care, we have created designated Provider Care  Teams.  These Care Teams include your primary Cardiologist (physician) and Advanced Practice Providers (APPs -  Physician Assistants and Nurse Practitioners) who all work together to provide you with the care you need, when you need it.  We recommend signing up for the patient portal called "MyChart".  Sign up information is provided on this After Visit Summary.  MyChart is used to connect with patients for Virtual Visits (Telemedicine).  Patients are able to view lab/test results, encounter notes, upcoming appointments, etc.  Non-urgent messages can be sent to your provider as well.   To learn more about what you can do with MyChart, go to [email or web address].    Your next appointment:   2 week(s)  Provider:   Marjie Skiff, PA-C, Robet Leu, PA-C, Azalee Course, PA-C, Bernadene Person, NP, or [facility], NP        Then, Dr. Servando Salina will plan to see you again in 6 week(s).    Other Instructions After getting you blood pressure cuff please keep a log and bring with you to your upcoming visits.  HOW TO TAKE YOUR BLOOD PRESSURE: Rest 5 minutes before taking your blood pressure. Don't smoke or drink caffeinated beverages for at least 30 minutes before. Take your blood pressure before (not after) you eat. Sit comfortably with your back supported and both feet on the floor (don't cross your legs). Elevate your arm to heart level on a table or a desk. Use the proper sized cuff. It should fit smoothly and snugly around your bare upper arm. There should be enough room to slip a fingertip under the cuff. The bottom edge of the  cuff should be 1 inch above the crease of the elbow. Ideally, take 3 measurements at one sitting and record the average.

## 2023-01-14 NOTE — Progress Notes (Signed)
Cardiology Office Note:    Date:  01/16/2023   ID:  Kylie Torres, DOB 1945-02-01, MRN [id number]  PCP:  Faith Rogue, DO  Cardiologist:  Thomasene Ripple, DO  Electrophysiologist:  None   Referring MD: Faith Rogue, DO   " I am having shortness of breath"  History of Present Illness:    Kylie Torres is a 77 y.o. female with a history of hypertension and possible hypertrophic cardiomyopathy, presents with worsening shortness of breath since quitting smoking three months ago. The patient reports that the shortness of breath is sometimes severe, causing her to stop and rest during daily activities. Despite this, the patient states she is able to complete all necessary tasks at home, albeit with intermittent rest periods due to breathlessness. The patient's blood pressure remains high despite current medication, and she also reports leg swelling.  Past Medical History:  Diagnosis Date   Acute pain of left shoulder 07/18/2019   Alcohol abuse    Blood transfusion without reported diagnosis    Cirrhosis (HCC)    Gastric ulcer    Heart murmur    Hypertension    Hypertensive retinopathy    OU   Peripheral arterial disease (HCC)     Past Surgical History:  Procedure Laterality Date   ABDOMINAL HYSTERECTOMY     BREAST EXCISIONAL BIOPSY Left 1971   benign   CATARACT EXTRACTION Bilateral    Dr. Dione Booze   COLONOSCOPY  04/29/2021   COLONOSCOPY W/ POLYPECTOMY  03/23/2016   ESOPHAGOGASTRODUODENOSCOPY (EGD) WITH PROPOFOL N/A 04/17/2015   Procedure: ESOPHAGOGASTRODUODENOSCOPY (EGD) WITH PROPOFOL;  Surgeon: Meryl Dare, MD;  Location: [facility];  Service: Endoscopy;  Laterality: N/A;   EYE SURGERY Bilateral    Cat Sx   OOPHORECTOMY      Current Medications: Current Meds  Medication Sig   albuterol (VENTOLIN HFA) 108 (90 Base) MCG/ACT inhaler Inhale 2 puffs into the lungs every 6 (six) hours as needed for wheezing or shortness of breath.   aspirin EC 81 MG tablet Take 1 tablet (81 mg  total) by mouth daily. Swallow whole.   [EXPIRED] Blood Pressure Monitoring (BLOOD PRESSURE MONITOR AUTOMAT) DEVI Use as directed to check blood pressure once a day as directed.   CARTIA XT 240 MG 24 hr capsule TAKE 1 CAPSULE BY MOUTH DAILY   carvedilol (COREG) 12.5 MG tablet Take 1 tablet (12.5 mg total) by mouth 2 (two) times daily.   cholecalciferol (VITAMIN D3) 25 MCG (1000 UNIT) tablet Take 1 tablet (1,000 Units total) by mouth daily.   cilostazol (PLETAL) 100 MG tablet Take 1 tablet (100 mg total) by mouth 2 (two) times daily before a meal.   losartan (COZAAR) 100 MG tablet Take 1 tablet (100 mg total) by mouth daily.   pantoprazole (PROTONIX) 40 MG tablet TAKE 1 TABLET BY MOUTH DAILY  BEFORE BREAKFAST   umeclidinium bromide (INCRUSE ELLIPTA) 62.5 MCG/ACT AEPB Inhale 1 puff into the lungs daily.     Allergies:   Patient has no known allergies.   Social History   Socioeconomic History   Marital status: Divorced    Spouse name: Not on file   Number of children: 3   Years of education: Not on file   Highest education level: Not on file  Occupational History   Occupation: retired  Tobacco Use   Smoking status: Former    Current packs/day: 0.00    Average packs/day: 1 pack/day for 30.1 years (30.1 ttl pk-yrs)    Types: Cigarettes  Start date: 01/27/2019    Quit date: 09/2022    Years since quitting: 0.3   Smokeless tobacco: Never   Tobacco comments:    Smoked cigarette this am 7:30 ish  Vaping Use   Vaping status: Never Used  Substance and Sexual Activity   Alcohol use: No    Comment: quit drinking in 03/2015 - previously 1/5 gin every 2 days   Drug use: No   Sexual activity: Not on file  Other Topics Concern   Not on file  Social History Narrative   Current Social History 05/18/2019        Patient lives alone in an apartment which is 2 stories. There are steps with hand rails which the patient uses.       Patient's method of transportation is via her daughter.       The highest level of education was high school diploma.      The patient currently retired.      Identified important Relationships are with her daughter, a good friend who calls her daily, and a neighbor.      Pets : No       Interests / Fun: Watching TV and reading about various subjects       Current Stressors: None       Religious / Personal Beliefs: Holliness       Other: Will I get to see Dr. Evelene Croon again before she leaves?  Pt informed she has an upcoming appointment with Dr. Evelene Croon on 07/03/2019 @ 1:15 and this would be included in her AVS.   SChaplin, RN,BSN       Social Drivers of Health   Financial Resource Strain: Low Risk  (09/23/2022)   Overall Financial Resource Strain (CARDIA)    Difficulty of Paying Living Expenses: Not hard at all  Food Insecurity: No Food Insecurity (09/23/2022)   Hunger Vital Sign    Worried About Running Out of Food in the Last Year: Never true    Ran Out of Food in the Last Year: Never true  Transportation Needs: No Transportation Needs (09/23/2022)   PRAPARE - Administrator, Civil Service (Medical): No    Lack of Transportation (Non-Medical): No  Physical Activity: Inactive (09/23/2022)   Exercise Vital Sign    Days of Exercise per Week: 0 days    Minutes of Exercise per Session: 0 min  Stress: No Stress Concern Present (09/23/2022)   [employer] of Occupational Health - Occupational Stress Questionnaire    Feeling of Stress : Not at all  Social Connections: Socially Isolated (09/23/2022)   Social Connection and Isolation Panel [NHANES]    Frequency of Communication with Friends and Family: More than three times a week    Frequency of Social Gatherings with Friends and Family: Never    Attends Religious Services: Never    Database administrator or Organizations: No    Attends Engineer, structural: Never    Marital Status: Divorced     Family History: The patient's family history includes Breast cancer in her  maternal aunt; Breast cancer (age of onset: 91) in her mother; Cancer in her mother; Diabetes in her sister; Heart attack in her brother and father; Hypertension in her father and sister; Stomach cancer in her maternal aunt. There is no history of Colon cancer, Esophageal cancer, or Rectal cancer.  ROS:   Review of Systems  Constitution: Negative for decreased appetite, fever and weight gain.  HENT: Negative for  congestion, ear discharge, hoarse voice and sore throat.   Eyes: Negative for discharge, redness, vision loss in right eye and visual halos.  Cardiovascular: Negative for chest pain, dyspnea on exertion, leg swelling, orthopnea and palpitations.  Respiratory: Negative for cough, hemoptysis, shortness of breath and snoring.   Endocrine: Negative for heat intolerance and polyphagia.  Hematologic/Lymphatic: Negative for bleeding problem. Does not bruise/bleed easily.  Skin: Negative for flushing, nail changes, rash and suspicious lesions.  Musculoskeletal: Negative for arthritis, joint pain, muscle cramps, myalgias, neck pain and stiffness.  Gastrointestinal: Negative for abdominal pain, bowel incontinence, diarrhea and excessive appetite.  Genitourinary: Negative for decreased libido, genital sores and incomplete emptying.  Neurological: Negative for brief paralysis, focal weakness, headaches and loss of balance.  Psychiatric/Behavioral: Negative for altered mental status, depression and suicidal ideas.  Allergic/Immunologic: Negative for HIV exposure and persistent infections.    EKGs/Labs/Other Studies Reviewed:    The following studies were reviewed today:   EKG:  The ekg ordered today demonstrates   Recent Labs: 11/30/2022: B Natriuretic Peptide 562.8; Hemoglobin 13.3; Platelets 206 01/14/2023: ALT 9; BUN 15; Creatinine, Ser 0.92; Potassium 4.4; Sodium 141  Recent Lipid Panel    Component Value Date/Time   CHOL 121 12/05/2021 0946   TRIG 61 12/05/2021 0946   HDL 44  12/05/2021 0946   CHOLHDL 2.8 12/05/2021 0946   LDLCALC 64 12/05/2021 0946    Physical Exam:    VS:  BP (!) 150/90   Pulse 69   Ht 5\' 1"  (1.549 m)   Wt 142 lb 6.4 oz (64.6 kg)   SpO2 95%   BMI 26.91 kg/m     Wt Readings from Last 3 Encounters:  01/14/23 142 lb 6.4 oz (64.6 kg)  12/10/22 142 lb 1.6 oz (64.5 kg)  11/30/22 135 lb (61.2 kg)     GEN: Well nourished, well developed in no acute distress HEENT: Normal NECK: No JVD; No carotid bruits LYMPHATICS: No lymphadenopathy CARDIAC: S1S2 noted,RRR, no murmurs, rubs, gallops RESPIRATORY:  Clear to auscultation without rales, wheezing or rhonchi  ABDOMEN: Soft, non-tender, non-distended, +bowel sounds, no guarding. EXTREMITIES: No edema, No cyanosis, no clubbing MUSCULOSKELETAL:  No deformity  SKIN: Warm and dry NEUROLOGIC:  Alert and oriented x 3, non-focal PSYCHIATRIC:  Normal affect, good insight  ASSESSMENT:    1. Primary hypertension   2. Hypertrophic nonobstructive cardiomyopathy (HCC)   3. Dyspnea on exertion    PLAN:    -HYPERTROPHIC CARDIOMYOPATHY: prior suspicion of for HCM. We will order a repeat echocardiogram to check for changes in your heart function and wall thickness. If the echocardiogram still suggests hypertrophic cardiomyopathy, we will proceed with a cardiac MRI for a definitive diagnosis. If confirmed, you will be referred to medical genetics for genetic testing and counseling.  -HYPERTENSION: blood pressure elevated in the office. I will add Carvedilol to your current medications to help control your blood pressure and potentially reduce symptoms of hypertrophic cardiomyopathy. Will send a RX for automated blood pressure cuff.   A follow-up with the pharmacist is scheduled in 2-3 weeks to assess how you are tolerating the new medication and if any adjustments are needed. Additionally, a follow-up with the physician is scheduled in 6 weeks to review your echocardiogram results and discuss further  management. Give the patient a dose of clonidine in the office given accelerated hypertension,. Blood pressure improved to 150/90 mmhg after observation for an hr came down from 180/100 mmHg.  -SHORTNESS OF BREATH:  Since you have noticed  worsening symptoms since quitting smoking, we will consider ordering lung function tests to see if you need additional inhaler therapy. We will continue to monitor this in relation to your hypertension and potential hypertrophic cardiomyopathy.  -SMOKING CESSATION: Congratulations on quitting smoking 3 months ago. This is a significant achievement for your overall health. Please continue to abstain from smoking.  -SWELLING IN LEGS: Swelling in the legs can be a sign of heart failure or other circulatory issues. We will monitor this symptom closely, especially in relation to your hypertension and potential hypertrophic cardiomyopathy.   The patient is in agreement with the above plan. The patient left the office in stable condition.  The patient will follow up in   Medication Adjustments/Labs and Tests Ordered: Current medicines are reviewed at length with the patient today.  Concerns regarding medicines are outlined above.  Orders Placed This Encounter  Procedures   Comprehensive metabolic panel   ECHOCARDIOGRAM COMPLETE   Meds ordered this encounter  Medications   cloNIDine (CATAPRES) tablet 0.1 mg   carvedilol (COREG) 12.5 MG tablet    Sig: Take 1 tablet (12.5 mg total) by mouth 2 (two) times daily.    Dispense:  180 tablet    Refill:  1   Blood Pressure Monitoring (BLOOD PRESSURE MONITOR AUTOMAT) DEVI    Sig: Use as directed to check blood pressure once a day as directed.    Dispense:  1 each    Refill:  0    Patient Instructions  Medication Instructions:  Your physician has recommended you make the following change in your medication:   -Start carvedilol (coreg) 12.5mg  twice daily.  *If you need a refill on your cardiac medications before  your next appointment, please call your pharmacy*   Lab Work: Your physician recommends that you have labs drawn today: CMET  If you have labs (blood work) drawn today and your tests are completely normal, you will receive your results only by: MyChart Message (if you have MyChart) OR A paper copy in the mail If you have any lab test that is abnormal or we need to change your treatment, we will call you to review the results.   Testing/Procedures: Your physician has requested that you have an echocardiogram. Echocardiography is a painless test that uses sound waves to create images of your heart. It provides your doctor with information about the size and shape of your heart and how well your heart's chambers and valves are working. This procedure takes approximately one hour. There are no restrictions for this procedure. Please do NOT wear cologne, perfume, aftershave, or lotions (deodorant is allowed). Please arrive 15 minutes prior to your appointment time.  Please note: We ask at that you not bring children with you during ultrasound (echo/ vascular) testing. Due to room size and safety concerns, children are not allowed in the ultrasound rooms during exams. Our front office staff cannot provide observation of children in our lobby area while testing is being conducted. An adult accompanying a patient to their appointment will only be allowed in the ultrasound room at the discretion of the ultrasound technician under special circumstances. We apologize for any inconvenience.    Follow-Up: At [facility], you and your health needs are our priority.  As part of our continuing mission to provide you with exceptional heart care, we have created designated Provider Care Teams.  These Care Teams include your primary Cardiologist (physician) and Advanced Practice Providers (APPs -  Physician Assistants and Nurse Practitioners) who  all work together to provide you with the care you need,  when you need it.  We recommend signing up for the patient portal called "MyChart".  Sign up information is provided on this After Visit Summary.  MyChart is used to connect with patients for Virtual Visits (Telemedicine).  Patients are able to view lab/test results, encounter notes, upcoming appointments, etc.  Non-urgent messages can be sent to your provider as well.   To learn more about what you can do with MyChart, go to [email or web address].    Your next appointment:   2 week(s)  Provider:   Marjie Skiff, PA-C, Robet Leu, PA-C, Azalee Course, PA-C, Bernadene Person, NP, or [facility], NP        Then, Dr. Servando Salina will plan to see you again in 6 week(s).    Other Instructions After getting you blood pressure cuff please keep a log and bring with you to your upcoming visits.  HOW TO TAKE YOUR BLOOD PRESSURE: Rest 5 minutes before taking your blood pressure. Don't smoke or drink caffeinated beverages for at least 30 minutes before. Take your blood pressure before (not after) you eat. Sit comfortably with your back supported and both feet on the floor (don't cross your legs). Elevate your arm to heart level on a table or a desk. Use the proper sized cuff. It should fit smoothly and snugly around your bare upper arm. There should be enough room to slip a fingertip under the cuff. The bottom edge of the cuff should be 1 inch above the crease of the elbow. Ideally, take 3 measurements at one sitting and record the average.         Adopting a Healthy Lifestyle.  Know what a healthy weight is for you (roughly BMI <25) and aim to maintain this   Aim for 7+ servings of fruits and vegetables daily   65-80+ fluid ounces of water or unsweet tea for healthy kidneys   Limit to max 1 drink of alcohol per day; avoid smoking/tobacco   Limit animal fats in diet for cholesterol and heart health - choose grass fed whenever available   Avoid highly processed foods, and foods high in  saturated/trans fats   Aim for low stress - take time to unwind and care for your mental health   Aim for 150 min of moderate intensity exercise weekly for heart health, and weights twice weekly for bone health   Aim for 7-9 hours of sleep daily   When it comes to diets, agreement about the perfect plan isnt easy to find, even among the experts. Experts at the [facility] [employer] developed an idea known as the Healthy Eating Plate. Just imagine a plate divided into logical, healthy portions.   The emphasis is on diet quality:   Load up on vegetables and fruits - one-half of your plate: Aim for color and variety, and remember that potatoes dont count.   Go for whole grains - one-quarter of your plate: Whole wheat, barley, wheat berries, quinoa, oats, brown rice, and foods made with them. If you want pasta, go with whole wheat pasta.   Protein power - one-quarter of your plate: Fish, chicken, beans, and nuts are all healthy, versatile protein sources. Limit red meat.   The diet, however, does go beyond the plate, offering a few other suggestions.   Use healthy plant oils, such as olive, canola, soy, corn, sunflower and peanut. Check the labels, and avoid partially hydrogenated oil, which have unhealthy  trans fats.   If youre thirsty, drink water. Coffee and tea are good in moderation, but skip sugary drinks and limit milk and dairy products to one or two daily servings.   The type of carbohydrate in the diet is more important than the amount. Some sources of carbohydrates, such as vegetables, fruits, whole grains, and beans-are healthier than others.   Finally, stay active  Signed, Thomasene Ripple, DO  01/16/2023 10:15 PM    [facility]

## 2023-01-15 ENCOUNTER — Other Ambulatory Visit (HOSPITAL_COMMUNITY): Payer: Self-pay

## 2023-01-15 ENCOUNTER — Other Ambulatory Visit: Payer: Self-pay

## 2023-01-15 LAB — COMPREHENSIVE METABOLIC PANEL
ALT: 9 [IU]/L (ref 0–32)
AST: 13 [IU]/L (ref 0–40)
Albumin: 4.4 g/dL (ref 3.8–4.8)
Alkaline Phosphatase: 124 [IU]/L — ABNORMAL HIGH (ref 44–121)
BUN/Creatinine Ratio: 16 (ref 12–28)
BUN: 15 mg/dL (ref 8–27)
Bilirubin Total: 0.6 mg/dL (ref 0.0–1.2)
CO2: 19 mmol/L — ABNORMAL LOW (ref 20–29)
Calcium: 9.7 mg/dL (ref 8.7–10.3)
Chloride: 107 mmol/L — ABNORMAL HIGH (ref 96–106)
Creatinine, Ser: 0.92 mg/dL (ref 0.57–1.00)
Globulin, Total: 2.7 g/dL (ref 1.5–4.5)
Glucose: 101 mg/dL — ABNORMAL HIGH (ref 70–99)
Potassium: 4.4 mmol/L (ref 3.5–5.2)
Sodium: 141 mmol/L (ref 134–144)
Total Protein: 7.1 g/dL (ref 6.0–8.5)
eGFR: 65 mL/min/{1.73_m2} (ref >59)

## 2023-01-27 NOTE — Progress Notes (Signed)
 Cardiology Office Note    Date:  01/29/2023  ID:  KEYUNA CUTHRELL, DOB October 03, 1945, MRN [id number] PCP:  Kandis Perkins, DO  Cardiologist:  Dub Huntsman, DO  Electrophysiologist:  None   Chief Complaint: Follow up for hypertension   History of Present Illness: .   Kylie Torres is a 78 y.o. female with visit-pertinent history of hypertension and possible hypertrophic cardiomyopathy.  First evaluated by Dr. Huntsman on 01/14/23 for worsening shortness of breath since she stopped smoking 3 to 4 months ago.  Patient reported that she was able to complete all necessary tasks at home with intermittent rest periods due to breathlessness.  She also endorsed lower extremity edema.  Patient's blood pressure was elevated in the office at 180/100, her blood pressure improved to 150/90 after a dose of clonidine  for accelerated hypertension.  Today she presents for follow up for hypertension. She reports that she continues to have occasional shortness of breath, specifically with activity. She denies chest pain, lower extremity swelling, orthopnea or pnd.  She reports that she found her blood pressure cuff at home and has been monitoring her blood pressure, however she has not been recording her home blood pressure.  She notes her blood pressure has been around 150 systolic.  She reports adherence with her medications.  Labwork independently reviewed: 01/14/2023: Sodium 141, potassium 4.4, creatinine 0.92, AST 13, ALT 9  ROS: .   Today she denies chest pain, lower extremity edema, fatigue, palpitations, melena, hematuria, hemoptysis, diaphoresis, weakness, presyncope, syncope, orthopnea, and PND.  All other systems are reviewed and otherwise negative. Studies Reviewed: SABRA    EKG:  EKG is ordered today, personally reviewed, demonstrating  EKG Interpretation Date/Time:  Friday January 29 2023 13:44:04 EST Ventricular Rate:  79 PR Interval:  138 QRS Duration:  80 QT Interval:  390 QTC Calculation: 447 R  Axis:   6  Text Interpretation: Normal sinus rhythm Possible Left atrial enlargement Left ventricular hypertrophy ( R in aVL , Sokolow-Lyon , Cornell product ) T wave inversion in lateral leads When compared with ECG of 30-Nov-2022 15:23, T wave inversion no longer evident in Inferior leads Confirmed by Shineka Auble [phone]) on 01/29/2023 1:50:00 PM   CV Studies:  Cardiac Studies & Procedures      ECHOCARDIOGRAM  ECHOCARDIOGRAM COMPLETE 02/15/2020  Narrative ECHOCARDIOGRAM REPORT    Patient Name:   Kylie Torres Date of Exam: 02/15/2020 Medical Rec #:  [id number]        Height:       62.0 in Accession #:    [id number]       Weight:       134.0 lb Date of Birth:  02/18/1945        BSA:          1.612 m Patient Age:    74 years         BP:           171/77 mmHg Patient Gender: F                HR:           72 bpm. Exam Location:  Outpatient  Procedure: 2D Echo, Color Doppler and Cardiac Doppler  Indications:    Cardiomyopathy-Unspecified 425.9 / I42.9  History:        Patient has prior history of Echocardiogram examinations, most recent 05/19/2017. Risk Factors:Hypertension and ETOH.  Sonographer:    Lauraine Pilot RDCS Referring Phys: [place] BROOM  IMPRESSIONS  1. Left ventricular ejection fraction, by estimation, is 60 to 65%. The left ventricle has normal function. The left ventricle has no regional wall motion abnormalities. There is severe left ventricular hypertrophy. Left ventricular diastolic parameters are consistent with Grade I diastolic dysfunction (impaired relaxation). The average left ventricular global longitudinal strain is -21.3 %. The global longitudinal strain is normal. 2. Right ventricular systolic function is normal. The right ventricular size is normal. There is normal pulmonary artery systolic pressure. 3. A small pericardial effusion is present. The pericardial effusion is circumferential. There is no evidence of cardiac tamponade. 4. The mitral valve  is normal in structure. No evidence of mitral valve regurgitation. No evidence of mitral stenosis. 5. The aortic valve is normal in structure. Aortic valve regurgitation is not visualized. No aortic stenosis is present. 6. The inferior vena cava is normal in size with greater than 50% respiratory variability, suggesting right atrial pressure of 3 mmHg.  Conclusion(s)/Recommendation(s): Left ventricular morphologic findings are suggestive of hypertrophic cardiomyopathy.  FINDINGS Left Ventricle: Left ventricular ejection fraction, by estimation, is 60 to 65%. The left ventricle has normal function. The left ventricle has no regional wall motion abnormalities. The average left ventricular global longitudinal strain is -21.3 %. The global longitudinal strain is normal. The left ventricular internal cavity size was normal in size. There is severe left ventricular hypertrophy. Left ventricular diastolic parameters are consistent with Grade I diastolic dysfunction (impaired relaxation).  Right Ventricle: The right ventricular size is normal. No increase in right ventricular wall thickness. Right ventricular systolic function is normal. There is normal pulmonary artery systolic pressure. The tricuspid regurgitant velocity is 1.78 m/s, and with an assumed right atrial pressure of 3 mmHg, the estimated right ventricular systolic pressure is 15.7 mmHg.  Left Atrium: Left atrial size was normal in size.  Right Atrium: Right atrial size was normal in size.  Pericardium: A small pericardial effusion is present. The pericardial effusion is circumferential. There is no evidence of cardiac tamponade.  Mitral Valve: The mitral valve is normal in structure. No evidence of mitral valve regurgitation. No evidence of mitral valve stenosis.  Tricuspid Valve: The tricuspid valve is normal in structure. Tricuspid valve regurgitation is not demonstrated. No evidence of tricuspid stenosis.  Aortic Valve: The aortic  valve is normal in structure. Aortic valve regurgitation is not visualized. No aortic stenosis is present.  Pulmonic Valve: The pulmonic valve was normal in structure. Pulmonic valve regurgitation is not visualized. No evidence of pulmonic stenosis.  Aorta: The aortic root is normal in size and structure.  Venous: The inferior vena cava is normal in size with greater than 50% respiratory variability, suggesting right atrial pressure of 3 mmHg.  IAS/Shunts: No atrial level shunt detected by color flow Doppler.   LEFT VENTRICLE PLAX 2D LVIDd:         3.70 cm  Diastology LVIDs:         2.20 cm  LV e' medial:    2.56 cm/s LV PW:         2.00 cm  LV E/e' medial:  32.3 LV IVS:        2.10 cm  LV e' lateral:   4.35 cm/s LVOT diam:     1.80 cm  LV E/e' lateral: 19.0 LV SV:         70 LV SV Index:   44       2D Longitudinal Strain LVOT Area:     2.54 cm 2D Strain GLS Avg:     -  21.3 %   RIGHT VENTRICLE RV S prime:     11.30 cm/s TAPSE (M-mode): 1.9 cm  LEFT ATRIUM             Index       RIGHT ATRIUM           Index LA diam:        2.70 cm 1.67 cm/m  RA Area:     11.60 cm LA Vol (A2C):   52.3 ml 32.43 ml/m RA Volume:   22.70 ml  14.08 ml/m LA Vol (A4C):   43.2 ml 26.79 ml/m LA Biplane Vol: 50.2 ml 31.13 ml/m AORTIC VALVE LVOT Vmax:   193.00 cm/s LVOT Vmean:  118.000 cm/s LVOT VTI:    0.276 m  AORTA Ao Root diam: 3.00 cm Ao Asc diam:  2.70 cm  MITRAL VALVE                TRICUSPID VALVE MV Area (PHT): 1.51 cm     TR Peak grad:   12.7 mmHg MV Decel Time: 504 msec     TR Vmax:        178.00 cm/s MV E velocity: 82.60 cm/s MV A velocity: 132.00 cm/s  SHUNTS MV E/A ratio:  0.63         Systemic VTI:  0.28 m Systemic Diam: 1.80 cm  Oneil Parchment MD Electronically signed by Oneil Parchment MD Signature Date/Time: 02/15/2020/3:21:07 PM    Final              Current Reported Medications:.    Current Meds  Medication Sig   albuterol  (VENTOLIN  HFA) 108 (90 Base) MCG/ACT  inhaler Inhale 2 puffs into the lungs every 6 (six) hours as needed for wheezing or shortness of breath.   aspirin  EC 81 MG tablet Take 1 tablet (81 mg total) by mouth daily. Swallow whole.   CARTIA  XT 240 MG 24 hr capsule TAKE 1 CAPSULE BY MOUTH DAILY   carvedilol  (COREG ) 12.5 MG tablet Take 1 tablet (12.5 mg total) by mouth 2 (two) times daily.   cholecalciferol (VITAMIN D3) 25 MCG (1000 UNIT) tablet Take 1 tablet (1,000 Units total) by mouth daily.   cilostazol  (PLETAL ) 100 MG tablet Take 1 tablet (100 mg total) by mouth 2 (two) times daily before a meal.   olmesartan  (BENICAR ) 40 MG tablet Take 1 tablet (40 mg total) by mouth daily.   pantoprazole  (PROTONIX ) 40 MG tablet TAKE 1 TABLET BY MOUTH DAILY  BEFORE BREAKFAST   umeclidinium bromide  (INCRUSE ELLIPTA ) 62.5 MCG/ACT AEPB Inhale 1 puff into the lungs daily.   [DISCONTINUED] losartan  (COZAAR ) 100 MG tablet Take 1 tablet (100 mg total) by mouth daily.   Physical Exam:    VS:  BP (!) 142/72   Pulse 79   Ht 5' 1 (1.549 m)   Wt 145 lb (65.8 kg)   SpO2 98%   BMI 27.40 kg/m    Wt Readings from Last 3 Encounters:  01/29/23 145 lb (65.8 kg)  01/14/23 142 lb 6.4 oz (64.6 kg)  12/10/22 142 lb 1.6 oz (64.5 kg)    GEN: Well nourished, well developed in no acute distress NECK: No JVD; No carotid bruits CARDIAC: RRR, 2/6 systolic murmur, no rubs or gallops RESPIRATORY:  Clear to auscultation without rales, wheezing or rhonchi  ABDOMEN: Soft, non-tender, non-distended EXTREMITIES:  No edema; No acute deformity   Asessement and Plan:SABRA    Hypertension: Initial blood pressure today 144/60, on recheck was 142/72.  Patient reports that she found her blood pressure cuff at home and has been monitoring her blood pressures.  She does not have a blood pressure log available to review today.  She notes that her systolic blood pressure has been around 150 at home.  Will stop losartan  and start olmesartan  40 mg daily, she will have check of her BMET in  two weeks.  She was provided with a salty 6 sheet as well as a blood pressure log and encouraged to monitor her blood pressures at home.  Requested that she bring her blood pressure cuff for comparison and her blood pressure log to follow-up.  Stop losartan .  Start olmesartan  40 mg daily.  Hypertrophic cardiomyopathy: There was prior suspicion for HCM, echo in 01/2020 with LVEF of 60 to 65%, no RWMA, severe LVH, grade 1 diastolic dysfunction.  Today she endorses dyspnea on exertion, denies chest pain, lower extremity edema, palpitations, presyncope or syncope.  She appears euvolemic and well compensated on exam.  She is to have repeat echocardiogram in 2 weeks followed by follow-up with Dr. Sheena.   Shortness of breath: Today she reports ongoing shortness of breath specifically with exertion.  She reports this is currently at her baseline.  Echocardiogram currently pending as noted above.   Disposition: F/u with Dr. Sheena on 02/26/23 as scheduled.   Signed, Elorah Dewing D Victorious Cosio, NP

## 2023-01-29 ENCOUNTER — Ambulatory Visit: Payer: 59 | Attending: Cardiology | Admitting: Cardiology

## 2023-01-29 ENCOUNTER — Encounter: Payer: Self-pay | Admitting: Cardiology

## 2023-01-29 VITALS — BP 142/72 | HR 79 | Ht 61.0 in | Wt 145.0 lb

## 2023-01-29 DIAGNOSIS — R0609 Other forms of dyspnea: Secondary | ICD-10-CM | POA: Diagnosis not present

## 2023-01-29 DIAGNOSIS — I422 Other hypertrophic cardiomyopathy: Secondary | ICD-10-CM | POA: Diagnosis not present

## 2023-01-29 DIAGNOSIS — I10 Essential (primary) hypertension: Secondary | ICD-10-CM

## 2023-01-29 MED ORDER — OLMESARTAN MEDOXOMIL 40 MG PO TABS: 40.0000 mg | ORAL_TABLET | Freq: Every day | ORAL | 3 refills | Status: DC

## 2023-01-29 NOTE — Patient Instructions (Addendum)
 Medication Instructions:  Stop Losartan  Start Olmesartan  40 mg once a day *If you need a refill on your cardiac medications before your next appointment, please call your pharmacy*  Lab Work: We will need Bmet in 2 weeks If you have labs (blood work) drawn today and your tests are completely normal, you will receive your results only by: MyChart Message (if you have MyChart) OR A paper copy in the mail If you have any lab test that is abnormal or we need to change your treatment, we will call you to review the results.  Testing/Procedures: No testing  Follow-Up: At [facility], you and your health needs are our priority.  As part of our continuing mission to provide you with exceptional heart care, we have created designated Provider Care Teams.  These Care Teams include your primary Cardiologist (physician) and Advanced Practice Providers (APPs -  Physician Assistants and Nurse Practitioners) who all work together to provide you with the care you need, when you need it.  We recommend signing up for the patient portal called MyChart.  Sign up information is provided on this After Visit Summary.  MyChart is used to connect with patients for Virtual Visits (Telemedicine).  Patients are able to view lab/test results, encounter notes, upcoming appointments, etc.  Non-urgent messages can be sent to your provider as well.   To learn more about what you can do with MyChart, go to [email or web address].    Your next appointment:   Reschedule Kardie Tobb, DO appointment  Other Directions: Please keep blood pressure log for the next 2 weeks and bring to next appointment with Dr. Sheena. Bring blood pressure cuff to the next appointment with Dr. Sheena

## 2023-02-01 DIAGNOSIS — M180 Bilateral primary osteoarthritis of first carpometacarpal joints: Secondary | ICD-10-CM | POA: Diagnosis not present

## 2023-02-11 ENCOUNTER — Encounter: Payer: 59 | Admitting: Student

## 2023-02-11 NOTE — Progress Notes (Deleted)
   Established Patient Office Visit  Subjective   Patient ID: Kylie Torres, female    DOB: 10/02/1945  Age: 78 y.o. MRN: [id number]  No chief complaint on file.   HPI  {History (Optional):23778}  ROS    Objective:     There were no vitals taken for this visit. {Vitals History (Optional):23777}  Physical Exam   No results found for any visits on 02/11/23.  {Labs (Optional):23779}  The ASCVD Risk score (Arnett DK, et al., 2019) failed to calculate for the following reasons:   The valid total cholesterol range is 130 to 320 mg/dL    Assessment & Plan:   Problem List Items Addressed This Visit   None  Emphysema  HTN On olmesartan 40 mg daily     No follow-ups on file.    Faith Rogue, DO

## 2023-02-19 ENCOUNTER — Ambulatory Visit (HOSPITAL_COMMUNITY)
Admission: RE | Admit: 2023-02-19 | Discharge: 2023-02-19 | Disposition: A | Discharge status: Home / Self Care | Payer: 59 | Source: Ambulatory Visit | Attending: Internal Medicine | Admitting: Internal Medicine

## 2023-02-19 DIAGNOSIS — I422 Other hypertrophic cardiomyopathy: Secondary | ICD-10-CM | POA: Diagnosis not present

## 2023-02-19 DIAGNOSIS — I10 Essential (primary) hypertension: Secondary | ICD-10-CM | POA: Insufficient documentation

## 2023-02-19 DIAGNOSIS — R0609 Other forms of dyspnea: Secondary | ICD-10-CM | POA: Insufficient documentation

## 2023-02-19 LAB — ECHOCARDIOGRAM COMPLETE
AR max vel: 2.09 cm2
AV Area VTI: 2.06 cm2
AV Area mean vel: 1.96 cm2
AV Mean grad: 6 mm[Hg]
AV Peak grad: 11.6 mm[Hg]
Ao pk vel: 1.7 m/s
Area-P 1/2: 2.79 cm2
MV M vel: 1.48 m/s
MV Peak grad: 8.8 mm[Hg]
S' Lateral: 1.63 cm

## 2023-02-26 ENCOUNTER — Ambulatory Visit: Payer: 59 | Admitting: Cardiology

## 2023-03-25 ENCOUNTER — Other Ambulatory Visit: Payer: Self-pay | Admitting: Student

## 2023-03-25 DIAGNOSIS — Z1231 Encounter for screening mammogram for malignant neoplasm of breast: Secondary | ICD-10-CM

## 2023-04-16 ENCOUNTER — Ambulatory Visit: Payer: 59 | Attending: Cardiology | Admitting: Cardiology

## 2023-06-07 ENCOUNTER — Ambulatory Visit
Admission: RE | Admit: 2023-06-07 | Discharge: 2023-06-07 | Disposition: A | Discharge status: Home / Self Care | Payer: 59 | Source: Ambulatory Visit | Attending: Cardiology | Admitting: Cardiology

## 2023-06-07 DIAGNOSIS — Z1231 Encounter for screening mammogram for malignant neoplasm of breast: Secondary | ICD-10-CM

## 2023-06-26 ENCOUNTER — Other Ambulatory Visit: Payer: Self-pay | Admitting: Student

## 2023-07-07 ENCOUNTER — Encounter (HOSPITAL_BASED_OUTPATIENT_CLINIC_OR_DEPARTMENT_OTHER): Payer: Self-pay

## 2023-07-07 ENCOUNTER — Emergency Department (HOSPITAL_BASED_OUTPATIENT_CLINIC_OR_DEPARTMENT_OTHER)

## 2023-07-07 ENCOUNTER — Other Ambulatory Visit: Payer: Self-pay

## 2023-07-07 ENCOUNTER — Emergency Department (HOSPITAL_BASED_OUTPATIENT_CLINIC_OR_DEPARTMENT_OTHER): Admission: EM | Admit: 2023-07-07 | Discharge: 2023-07-07 | Disposition: A | Discharge status: Home / Self Care

## 2023-07-07 DIAGNOSIS — I739 Peripheral vascular disease, unspecified: Secondary | ICD-10-CM | POA: Diagnosis not present

## 2023-07-07 DIAGNOSIS — Z79899 Other long term (current) drug therapy: Secondary | ICD-10-CM | POA: Diagnosis not present

## 2023-07-07 DIAGNOSIS — Z91148 Patient's other noncompliance with medication regimen for other reason: Secondary | ICD-10-CM | POA: Diagnosis not present

## 2023-07-07 DIAGNOSIS — I119 Hypertensive heart disease without heart failure: Secondary | ICD-10-CM | POA: Diagnosis not present

## 2023-07-07 DIAGNOSIS — R202 Paresthesia of skin: Secondary | ICD-10-CM | POA: Insufficient documentation

## 2023-07-07 DIAGNOSIS — R7989 Other specified abnormal findings of blood chemistry: Secondary | ICD-10-CM | POA: Insufficient documentation

## 2023-07-07 DIAGNOSIS — M791 Myalgia, unspecified site: Secondary | ICD-10-CM | POA: Diagnosis not present

## 2023-07-07 DIAGNOSIS — R52 Pain, unspecified: Secondary | ICD-10-CM

## 2023-07-07 DIAGNOSIS — I517 Cardiomegaly: Secondary | ICD-10-CM | POA: Diagnosis not present

## 2023-07-07 DIAGNOSIS — I10 Essential (primary) hypertension: Secondary | ICD-10-CM | POA: Insufficient documentation

## 2023-07-07 LAB — COMPREHENSIVE METABOLIC PANEL WITH GFR
ALT: 8 U/L (ref 0–44)
AST: 15 U/L (ref 15–41)
Albumin: 4.2 g/dL (ref 3.5–5.0)
Alkaline Phosphatase: 114 U/L (ref 38–126)
Anion gap: 13 (ref 5–15)
BUN: 18 mg/dL (ref 8–23)
CO2: 20 mmol/L — ABNORMAL LOW (ref 22–32)
Calcium: 10.2 mg/dL (ref 8.9–10.3)
Chloride: 106 mmol/L (ref 98–111)
Creatinine, Ser: 0.94 mg/dL (ref 0.44–1.00)
GFR, Estimated: >60 mL/min (ref >60)
Glucose, Bld: 98 mg/dL (ref 70–99)
Potassium: 4 mmol/L (ref 3.5–5.1)
Sodium: 139 mmol/L (ref 135–145)
Total Bilirubin: 0.7 mg/dL (ref 0.0–1.2)
Total Protein: 7.4 g/dL (ref 6.5–8.1)

## 2023-07-07 LAB — CBC WITH DIFFERENTIAL/PLATELET
Abs Immature Granulocytes: 0.02 10*3/uL (ref 0.00–0.07)
Basophils Absolute: 0.1 10*3/uL (ref 0.0–0.1)
Basophils Relative: 1 %
Eosinophils Absolute: 0.2 10*3/uL (ref 0.0–0.5)
Eosinophils Relative: 3 %
HCT: 40.1 % (ref 36.0–46.0)
Hemoglobin: 13.2 g/dL (ref 12.0–15.0)
Immature Granulocytes: 0 %
Lymphocytes Relative: 32 %
Lymphs Abs: 2.5 10*3/uL (ref 0.7–4.0)
MCH: 29 pg (ref 26.0–34.0)
MCHC: 32.9 g/dL (ref 30.0–36.0)
MCV: 88.1 fL (ref 80.0–100.0)
Monocytes Absolute: 0.8 10*3/uL (ref 0.1–1.0)
Monocytes Relative: 10 %
Neutro Abs: 4.2 10*3/uL (ref 1.7–7.7)
Neutrophils Relative %: 54 %
Platelets: 222 10*3/uL (ref 150–400)
RBC: 4.55 MIL/uL (ref 3.87–5.11)
RDW: 14.3 % (ref 11.5–15.5)
WBC: 7.7 10*3/uL (ref 4.0–10.5)
nRBC: 0 % (ref 0.0–0.2)

## 2023-07-07 LAB — URINALYSIS, ROUTINE W REFLEX MICROSCOPIC
Bacteria, UA: NONE SEEN
Bilirubin Urine: NEGATIVE
Glucose, UA: NEGATIVE mg/dL
Hgb urine dipstick: NEGATIVE
Ketones, ur: NEGATIVE mg/dL
Leukocytes,Ua: NEGATIVE
Nitrite: NEGATIVE
Protein, ur: NEGATIVE mg/dL
Specific Gravity, Urine: 1.024 (ref 1.005–1.030)
pH: 6 (ref 5.0–8.0)

## 2023-07-07 LAB — PRO BRAIN NATRIURETIC PEPTIDE: Pro Brain Natriuretic Peptide: 874 pg/mL — ABNORMAL HIGH (ref <300.0)

## 2023-07-07 MED ORDER — IRBESARTAN 75 MG PO TABS: 37.5000 mg | ORAL_TABLET | Freq: Every day | ORAL | Status: DC

## 2023-07-07 MED ORDER — OLMESARTAN MEDOXOMIL 40 MG PO TABS: 40.0000 mg | ORAL_TABLET | Freq: Every day | ORAL | 0 refills | Status: DC

## 2023-07-07 MED ORDER — HYDROCODONE-ACETAMINOPHEN 5-325 MG PO TABS: 1.0000 | ORAL_TABLET | Freq: Once | ORAL | Status: DC

## 2023-07-07 MED ORDER — GABAPENTIN 100 MG PO CAPS: 300.0000 mg | ORAL_CAPSULE | Freq: Every day | ORAL | 0 refills | Status: DC

## 2023-07-07 NOTE — Discharge Instructions (Addendum)
 Please take 1 tablet once before bedtime daily for the next 7 days, then trend this medication upwards.   You will need to be reassessed by your primary care physician next Friday, in order to increase the dosing of this medication.  I have also prescribed you a medication that was previously ordered for blood pressure control, please take this as directed.

## 2023-07-07 NOTE — ED Notes (Signed)
Lab called for add on order.

## 2023-07-07 NOTE — ED Provider Notes (Signed)
 [place] EMERGENCY DEPARTMENT AT [facility] Provider Note   CSN: [id number] Arrival date & time: 07/07/23  1356     History HTN noncompliant with medication Chief Complaint  Patient presents with   Generalized Body Aches    Kylie Torres is a 78 y.o. female.  78 y.o female with a PMH of HTN, Alcohol abuse, peripheral arterial disease presents to the ED with a chief complaint of hurting all over.  Patient reports she has been hurting all over for a few months.  She now endorses tingling to bilateral upper extremities, bilateral lower extremities, reports now she feels pain all over her joints and muscles that are really bad.  She feels that she does not have enough strength to bilateral upper extremities in order to lift anything, this has been ongoing for 2 months.  He does take Tylenol  arthritis but there has not been any improvement in her symptoms.  She is currently followed at [facility] internal medicine, last seen them in January, however she does have an appointment establish a new PCP at [facility] next Friday.  She is currently on medication for blood pressure, reports she does not take this medication.  According to daughter at the bedside, patient does not like to take her medication.  Denies any chest pain, shortness of breath, abdominal pain, nausea or vomiting.   The history is provided by the patient.       Home Medications Prior to Admission medications   Medication Sig Start Date End Date Taking? Authorizing Provider  gabapentin (NEURONTIN) 100 MG capsule Take 3 capsules (300 mg total) by mouth at bedtime for 10 days. 07/07/23 07/17/23 Yes Adelee Hannula, PA-C  olmesartan  (BENICAR ) 40 MG tablet Take 1 tablet (40 mg total) by mouth daily for 10 days. 07/07/23 07/17/23 Yes Derris Millan, PA-C  rosuvastatin  (CRESTOR ) 20 MG tablet TAKE 1 TABLET BY MOUTH DAILY 06/28/23  Yes Bender, Sherline Distel, DO  albuterol  (VENTOLIN  HFA) 108 (90 Base) MCG/ACT inhaler Inhale 2 puffs into the  lungs every 6 (six) hours as needed for wheezing or shortness of breath. 12/11/22   Nooruddin, Saad, MD  CARTIA  XT 240 MG 24 hr capsule TAKE 1 CAPSULE BY MOUTH DAILY 11/13/22   Adria Hopkins, MD  carvedilol  (COREG ) 12.5 MG tablet Take 1 tablet (12.5 mg total) by mouth 2 (two) times daily. 01/14/23   Tobb, Kardie, DO  cholecalciferol (VITAMIN D3) 25 MCG (1000 UNIT) tablet Take 1 tablet (1,000 Units total) by mouth daily. 09/14/22   Aurora Lees, DO  cilostazol  (PLETAL ) 100 MG tablet Take 1 tablet (100 mg total) by mouth 2 (two) times daily before a meal. 07/07/22   Young Hensen, MD  pantoprazole  (PROTONIX ) 40 MG tablet TAKE 1 TABLET BY MOUTH DAILY  BEFORE BREAKFAST 06/28/23   Aurora Lees, DO  umeclidinium bromide  (INCRUSE ELLIPTA ) 62.5 MCG/ACT AEPB Inhale 1 puff into the lungs daily. 12/10/22   Nooruddin, Saad, MD      Allergies    Patient has no known allergies.    Review of Systems   Review of Systems  Constitutional:  Negative for chills and fever.  HENT:  Negative for sore throat.   Respiratory:  Negative for shortness of breath.   Cardiovascular:  Negative for chest pain.  Gastrointestinal:  Negative for abdominal pain, nausea and vomiting.  Genitourinary:  Negative for flank pain.  Musculoskeletal:  Positive for myalgias. Negative for back pain.  Neurological:  Negative for syncope, light-headedness and headaches.  All other  systems reviewed and are negative.   Physical Exam Updated Vital Signs BP (!) 193/88   Pulse 69   Temp 97.7 F (36.5 C)   Resp 16   SpO2 97%  Physical Exam Vitals and nursing note reviewed.  Constitutional:      Appearance: Normal appearance.  HENT:     Head: Normocephalic and atraumatic.     Mouth/Throat:     Mouth: Mucous membranes are moist.  Cardiovascular:     Rate and Rhythm: Normal rate.  Pulmonary:     Effort: Pulmonary effort is normal.     Breath sounds: No wheezing.  Abdominal:     General: Abdomen is flat.      Palpations: Abdomen is soft.  Musculoskeletal:     Cervical back: Normal range of motion and neck supple.  Skin:    General: Skin is warm and dry.  Neurological:     Mental Status: She is alert and oriented to person, place, and time.     Comments: Alert, oriented, thought content appropriate. Speech fluent without evidence of aphasia. Able to follow 2 step commands without difficulty.  Cranial Nerves:  II:  Peripheral visual fields grossly normal, pupils, round, reactive to light III,IV, VI: ptosis not present, extra-ocular motions intact bilaterally  V,VII: smile symmetric, facial light touch sensation equal VIII: hearing grossly normal bilaterally  IX,X: midline uvula rise  XI: bilateral shoulder shrug equal and strong XII: midline tongue extension  Motor:  5/5 in upper and lower extremities bilaterally including strong and equal grip strength and dorsiflexion/plantar flexion Sensory: light touch normal in all extremities.  Cerebellar: normal finger-to-nose with bilateral upper extremities, pronator drift negative Gait: N/A       ED Results / Procedures / Treatments   Labs (all labs ordered are listed, but only abnormal results are displayed) Labs Reviewed  COMPREHENSIVE METABOLIC PANEL WITH GFR - Abnormal; Notable for the following components:      Result Value   CO2 20 (*)    All other components within normal limits  PRO BRAIN NATRIURETIC PEPTIDE - Abnormal; Notable for the following components:   Pro Brain Natriuretic Peptide 874.0 (*)    All other components within normal limits  URINALYSIS, ROUTINE W REFLEX MICROSCOPIC  CBC WITH DIFFERENTIAL/PLATELET    EKG None  Radiology DG Chest Portable 1 View Result Date: 07/07/2023 CLINICAL DATA:  Body aches. EXAM: PORTABLE CHEST 1 VIEW COMPARISON:  November 30, 2022. FINDINGS: Stable cardiomegaly. Both lungs are clear. The visualized skeletal structures are unremarkable. IMPRESSION: No active disease. Electronically Signed    By: Rosalene Colon M.D.   On: 07/07/2023 16:13    Procedures Procedures    Medications Ordered in ED Medications - No data to display   ED Course/ Medical Decision Making/ A&P                                 Medical Decision Making Amount and/or Complexity of Data Reviewed Labs: ordered. Radiology: ordered.  Risk Prescription drug management.    This patient presents to the ED for concern of bodyaches , this involves a number of treatment options, and is a complaint that carries with it a HIGH risk of complications and morbidity.  The differential diagnosis includes infection, peripheral neuropathy, PAD.    Co morbidities: Discussed in HPI   Brief History:  SEE hpi.   EMR reviewed including pt PMHx, past surgical history and past  visits to ER.   See HPI for more details   Lab Tests:  I ordered and independently interpreted labs.  The pertinent results include:    I personally reviewed all laboratory work and imaging. Metabolic panel without any acute abnormality specifically kidney function within normal limits and no significant electrolyte abnormalities. CBC without leukocytosis or significant anemia.  Imaging Studies:  NAD. I personally reviewed all imaging studies and no acute abnormality found. I agree with radiology interpretation.  Medicines ordered:  I ordered medication including norco  for pain control  Reevaluation of the patient after these medicines showed that the patient improved I have reviewed the patients home medicines and have made adjustments as needed  Reevaluation:  After the interventions noted above I re-evaluated patient and found that they have :stayed the same   Social Determinants of Health:  The patient's social determinants of health were a factor in the care of this patient  Problem List / ED Course:  Patient presented to the ED with a chief complaint of bodyaches, myalgias that been ongoing for several months.   Also endorsing some tingling to her hands that are not out of the ordinary as they have been going on for 3 months.  Previously followed by PCP of internal medicine at [facility], however has not seen him since the month of January.  Noncompliant with blood pressure medication, arrived to the ED hypertensive with a systolic in the 200s.  She denies any chest pain, shortness of breath, abdominal pain, headaches.  According to her records, she has a prior history of peripheral artery disease, some suspicion for this component causing worsening pain.  She does take Tylenol  arthritis without any improvement in her symptoms.  The daughter does tell me that she has an appointment to schedule a new PCP next Friday. Interpretation of her blood work reveals CBC with no leukocytosis, hemoglobin is within normal limits.  CMP with no electrolyte derangement, current levels unremarkable.  LFTs are within normal limits, no abdominal pain, no nausea, no vomiting.  UA without any signs of infection.  Given Norco to help with pain control. BNP is elevated, however she does not have any signs of pulmonary edema on x-ray, no signs of fluid overload on her exam.  I did discuss with her starting her on a short course of gabapentin to help with her peripheral neuropathy, she does follow-up with a PCP next reticulocyte, will ask her to increase the dose then.  She is agreeable to plan and treatment, return precautions discussed at length.  Patient hemodynamically stable for discharge. In addition, patient did ask for blood pressure medication to be refilled as she had not been taking this, will also refill this medication which according to cardiology's last note was Benicar  40 mg daily.   Dispostion:  After consideration of the diagnostic results and the patients response to treatment, I feel that the patent would benefit from follow-up with PCP and medication compliance.   Portions of this note were generated with Administrator, sports. Dictation errors may occur despite best attempts at proofreading.  Final Clinical Impression(s) / ED Diagnoses Final diagnoses:  Body aches    Rx / DC Orders ED Discharge Orders          Ordered    gabapentin (NEURONTIN) 100 MG capsule  Daily at bedtime        07/07/23 1639    olmesartan  (BENICAR ) 40 MG tablet  Daily  07/07/23 1639              Levern Kalka, PA-C 07/07/23 1641    Carin Charleston, MD 07/08/23 1530

## 2023-07-07 NOTE — ED Notes (Signed)
 FALL BUNDLE IN PLACE

## 2023-07-07 NOTE — ED Triage Notes (Addendum)
 Pt c/o hurting all over, down to my toes/ bottom of my feet. Been happening for months- all this week, off & on it was just my hands, now it's all over my body. States pain is sharp/ stabbing & then numb/ tingling.   Tylenol  arthritis w no relief

## 2023-07-16 DIAGNOSIS — E1151 Type 2 diabetes mellitus with diabetic peripheral angiopathy without gangrene: Secondary | ICD-10-CM | POA: Diagnosis not present

## 2023-07-16 DIAGNOSIS — I739 Peripheral vascular disease, unspecified: Secondary | ICD-10-CM | POA: Diagnosis not present

## 2023-07-16 DIAGNOSIS — J439 Emphysema, unspecified: Secondary | ICD-10-CM | POA: Diagnosis not present

## 2023-07-16 DIAGNOSIS — I10 Essential (primary) hypertension: Secondary | ICD-10-CM | POA: Diagnosis not present

## 2023-07-16 DIAGNOSIS — I509 Heart failure, unspecified: Secondary | ICD-10-CM | POA: Diagnosis not present

## 2023-07-16 DIAGNOSIS — M810 Age-related osteoporosis without current pathological fracture: Secondary | ICD-10-CM | POA: Diagnosis not present

## 2023-07-16 DIAGNOSIS — K7460 Unspecified cirrhosis of liver: Secondary | ICD-10-CM | POA: Diagnosis not present

## 2023-07-16 DIAGNOSIS — I422 Other hypertrophic cardiomyopathy: Secondary | ICD-10-CM | POA: Diagnosis not present

## 2023-07-16 DIAGNOSIS — Z79899 Other long term (current) drug therapy: Secondary | ICD-10-CM | POA: Diagnosis not present

## 2023-07-16 DIAGNOSIS — J449 Chronic obstructive pulmonary disease, unspecified: Secondary | ICD-10-CM | POA: Diagnosis not present

## 2023-07-22 ENCOUNTER — Other Ambulatory Visit: Payer: Self-pay | Admitting: Student

## 2023-07-22 DIAGNOSIS — I10 Essential (primary) hypertension: Secondary | ICD-10-CM

## 2023-07-23 NOTE — Telephone Encounter (Signed)
 NO LONGER [facility] PATIENT

## 2023-08-13 DIAGNOSIS — J449 Chronic obstructive pulmonary disease, unspecified: Secondary | ICD-10-CM | POA: Diagnosis not present

## 2023-08-13 DIAGNOSIS — I10 Essential (primary) hypertension: Secondary | ICD-10-CM | POA: Diagnosis not present

## 2023-08-13 DIAGNOSIS — Z0000 Encounter for general adult medical examination without abnormal findings: Secondary | ICD-10-CM | POA: Diagnosis not present

## 2023-08-13 DIAGNOSIS — K7400 Hepatic fibrosis, unspecified: Secondary | ICD-10-CM | POA: Diagnosis not present

## 2023-08-13 DIAGNOSIS — I422 Other hypertrophic cardiomyopathy: Secondary | ICD-10-CM | POA: Diagnosis not present

## 2023-08-13 DIAGNOSIS — Z1389 Encounter for screening for other disorder: Secondary | ICD-10-CM | POA: Diagnosis not present

## 2023-08-13 DIAGNOSIS — I472 Ventricular tachycardia, unspecified: Secondary | ICD-10-CM | POA: Diagnosis not present

## 2023-08-27 ENCOUNTER — Ambulatory Visit (HOSPITAL_BASED_OUTPATIENT_CLINIC_OR_DEPARTMENT_OTHER): Admitting: Family

## 2023-08-27 ENCOUNTER — Encounter (HOSPITAL_BASED_OUTPATIENT_CLINIC_OR_DEPARTMENT_OTHER): Payer: Self-pay | Admitting: Family

## 2023-08-27 VITALS — BP 142/72 | HR 95 | Ht 60.0 in | Wt 142.4 lb

## 2023-08-27 DIAGNOSIS — I472 Ventricular tachycardia, unspecified: Secondary | ICD-10-CM | POA: Diagnosis not present

## 2023-08-27 DIAGNOSIS — I422 Other hypertrophic cardiomyopathy: Secondary | ICD-10-CM

## 2023-08-27 DIAGNOSIS — I10 Essential (primary) hypertension: Secondary | ICD-10-CM

## 2023-08-27 DIAGNOSIS — I471 Supraventricular tachycardia, unspecified: Secondary | ICD-10-CM | POA: Diagnosis not present

## 2023-08-27 MED ORDER — CARVEDILOL 12.5 MG PO TABS: 12.5000 mg | ORAL_TABLET | Freq: Two times a day (BID) | ORAL | 3 refills | Status: AC

## 2023-08-27 NOTE — Progress Notes (Signed)
 Cardiology Office Note   Date:  08/27/2023  ID:  Kylie Torres, DOB 05-Apr-1945, MRN [id number] PCP: Haze Kingfisher, MD  [facility] Providers Cardiologist:  Dub Huntsman, DO     History of Present Illness Kylie Torres is a 78 y.o. female with hx of HTN, HCM, HLD.   At visit 12/2022 due to hypertension, carvedilol  added. Due to HCM, repeat echo ordered.   Echo for HCM 02/19/23 LVEF 70-75%, no RWMA, severe LVH, gr1dd, small pericardial effusion without tamponade, mild MR with minimal LV outflow tract obstruction at rest. No significant change from prior study  Seen 02/25/23 with BP not at goal, Olmesartan  40mg  daily initiated. Has missed 2 visits with Dr. Huntsman since that time.   Presents today for follow up after monitor ordered by her PCP. Her daughter is present. Predominantly NSR, 5 runs of VT longest 18 beat at 109 bpm, 30 SVT with longest 10.9 second average rate 128 bpm. There were no triggered episodes. Average heart rate of 74 bpm. She reports her primary care heard some irregularly in her heart beat and ordered the monitor. She reports palpitations intermittently at random. It lasts a couple seconds and self resolves. She has 1 cup of coffee int he morning, an occasional soda, no caffeine otherwise. She does try to stay well hydrated with water. She checks BP at home and report it right though readings remain more than 130 and are variable. Report occasional lightheadedness which started recently. It is not positional. No near syncope nor syncope. She reports her breathing has been good and has no chest pain.   ROS: Please see the history of present illness.    All other systems reviewed and are negative.   Studies Reviewed      Cardiac Studies & Procedures   ______________________________________________________________________________________________     ECHOCARDIOGRAM  ECHOCARDIOGRAM COMPLETE 02/19/2023  Narrative ECHOCARDIOGRAM REPORT    Patient Name:   Kylie Torres Date of Exam: 02/19/2023 Medical Rec #:  [id number]    Height:       61.0 in Accession #:    [id number]   Weight:       145.0 lb Date of Birth:  06/13/1945    BSA:          1.647 m Patient Age:    78 years     BP:           155/73 mmHg Patient Gender: F            HR:           62 bpm. 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 Risk Factors:Hypertension and ETOH.  Sonographer:    Rosaline Fujisawa MHA, RDMS, RVT, RDCS Referring Phys: [id number] KARDIE TOBB   Sonographer Comments: Global longitudinal strain was attempted. IMPRESSIONS   1. Left ventricular ejection fraction, by estimation, is 70 to 75%. The left ventricle has hyperdynamic function. The left ventricle has no regional wall motion abnormalities. There is severe concentric left ventricular hypertrophy. Left ventricular diastolic parameters are consistent with Grade I diastolic dysfunction (impaired relaxation). Elevated left atrial pressure. 2. Right ventricular systolic function is hyperdynamic. The right ventricular size is normal. Tricuspid regurgitation signal is inadequate  for assessing PA pressure. 3. Left atrial size was severely dilated. 4. A small pericardial effusion is present. The pericardial effusion is circumferential. There is no evidence of cardiac tamponade. 5. There is clear systolic anterior motion of the mitral valve, but there is minimal LV outflow tract obstruction at rest (peak gradient 15 mm Hg). The mitral valve is normal in structure. Mild mitral valve regurgitation. No evidence of mitral stenosis. 6. The aortic valve is normal in structure. Aortic valve regurgitation is  not visualized. No aortic stenosis is present.  Comparison(s): No significant change from prior study. Prior images reviewed side by side.  Conclusion(s)/Recommendation(s): Findings consistent with hypertrophic obstructive cardiomyopathy.  FINDINGS Left Ventricle: Left ventricular ejection fraction, by estimation, is 70 to 75%. The left ventricle has hyperdynamic function. The left ventricle has no regional wall motion abnormalities. Global longitudinal strain performed but not reported based on interpreter judgement due to suboptimal tracking. 3D ejection fraction reviewed and evaluated as part of the interpretation. Alternate measurement of EF is felt to be most reflective of LV function. The left ventricular internal cavity size was small. There is severe concentric left ventricular hypertrophy. Left ventricular diastolic parameters are consistent with Grade I diastolic dysfunction (impaired relaxation). Elevated left atrial pressure.  Right Ventricle: The right ventricular size is normal. No increase in right ventricular wall thickness. Right ventricular systolic function is hyperdynamic. Tricuspid regurgitation signal is inadequate for assessing PA pressure.  Left Atrium: Left atrial size was severely dilated.  Right Atrium: Right atrial size was normal in size.  Pericardium: A small pericardial effusion is present. The pericardial effusion is circumferential. There is no evidence of cardiac tamponade.  Mitral Valve: There is clear systolic anterior motion of the mitral valve, but there is minimal LV outflow tract obstruction at rest (peak gradient 15 mm Hg). The mitral valve is normal in structure. Mild mitral annular calcification. Mild mitral valve regurgitation. No evidence of mitral valve stenosis.  Tricuspid Valve: The tricuspid valve is normal in structure. Tricuspid valve regurgitation is not demonstrated. No evidence of tricuspid stenosis.  Aortic Valve: The aortic valve is  normal in structure. Aortic valve regurgitation is not visualized. No aortic stenosis is present. Aortic valve mean gradient measures 6.0 mmHg. Aortic valve peak gradient measures 11.6 mmHg. Aortic valve area, by VTI measures 2.06 cm.  Pulmonic Valve: The pulmonic valve was not well visualized. Pulmonic valve regurgitation is not visualized. No evidence of pulmonic stenosis.  Aorta: The aortic root and ascending aorta are structurally normal, with no evidence of dilitation.  IAS/Shunts: No atrial level shunt detected by color flow Doppler.   LEFT VENTRICLE PLAX 2D LVIDd:         3.34 cm   Diastology LVIDs:         1.63 cm   LV e' medial:    1.86 cm/s LV PW:         1.73 cm   LV E/e' medial:  48.1 LV IVS:        1.68 cm   LV e' lateral:   2.68 cm/s LVOT diam:     1.49 cm   LV E/e' lateral: 33.4 LV SV:         65 LV SV Index:   39 LVOT Area:     1.74 cm  3D Volume EF: 3D EF:        39 % LV EDV:       140 ml LV ESV:       85 ml  LV SV:        55 ml  RIGHT VENTRICLE RV Basal diam:  2.85 cm RV Mid diam:    1.92 cm RV S prime:     13.50 cm/s TAPSE (M-mode): 1.2 cm  LEFT ATRIUM             Index        RIGHT ATRIUM           Index LA diam:        3.54 cm 2.15 cm/m   RA Area:     11.40 cm LA Vol (A2C):   77.2 ml 46.86 ml/m  RA Volume:   22.60 ml  13.72 ml/m LA Vol (A4C):   65.5 ml 39.76 ml/m LA Biplane Vol: 73.0 ml 44.31 ml/m AORTIC VALVE AV Area (Vmax):    2.09 cm AV Area (Vmean):   1.96 cm AV Area (VTI):     2.06 cm AV Vmax:           170.00 cm/s AV Vmean:          112.000 cm/s AV VTI:            0.313 m AV Peak Grad:      11.6 mmHg AV Mean Grad:      6.0 mmHg LVOT Vmax:         204.00 cm/s LVOT Vmean:        126.000 cm/s LVOT VTI:          0.370 m LVOT/AV VTI ratio: 1.18  AORTA Ao Root diam: 2.46 cm  MITRAL VALVE MV Area (PHT): 2.79 cm     SHUNTS MV Decel Time: 272 msec     Systemic VTI:  0.37 m MR Peak grad: 8.8 mmHg      Systemic Diam: 1.49 cm MR  Vmax:      148.00 cm/s MV E velocity: 89.40 cm/s MV A velocity: 139.00 cm/s MV E/A ratio:  0.64  Mihai Croitoru MD Electronically signed by Jerel Balding MD Signature Date/Time: 02/19/2023/3:10:47 PM    Final          ______________________________________________________________________________________________      Risk Assessment/Calculations   HYPERTENSION CONTROL Vitals:   08/27/23 1424 08/27/23 1515  BP: (!) 152/74 (!) 142/72    The patient's blood pressure is elevated above target today.  In order to address the patient's elevated BP: A new medication was prescribed today.; Follow up with general cardiology has been recommended.          Physical Exam VS:  BP (!) 142/72   Pulse 95   Ht 5' (1.524 m)   Wt 142 lb 6.4 oz (64.6 kg)   SpO2 94%   BMI 27.81 kg/m   Vitals:   08/27/23 1424 08/27/23 1515  BP: (!) 152/74 (!) 142/72  Pulse: 95   Height: 5' (1.524 m)   Weight: 142 lb 6.4 oz (64.6 kg)   SpO2: 94%   BMI (Calculated): 27.81          Wt Readings from Last 3 Encounters:  08/27/23 142 lb 6.4 oz (64.6 kg)  01/29/23 145 lb (65.8 kg)  01/14/23 142 lb 6.4 oz (64.6 kg)    GEN: Well nourished, well developed in no acute distress NECK: No JVD; No carotid bruits CARDIAC: RRR, gr 3/6 murmurs, rubs, gallops RESPIRATORY:  Clear to auscultation without rales, wheezing or rhonchi  ABDOMEN: Soft, non-tender, non-distended EXTREMITIES:  No edema; No deformity   ASSESSMENT AND PLAN  SVT /  VT - monitor by PCP with episodes of SVT and VT. Average HR 74 bpm. No triggered episodes. Reports rare palpitations which are fleeting and self resolve. Reports occasional lightheadedness with no clear pattern but no near syncope, syncope. She is only taking Diltiazem  ER and no longer taking Coreg . Resume Coreg  12.5mg  BID. BMET, CBC, mag, TSH today.   HCM - Euvolemic and well compensated on exam. Reports no dyspnea. Murmur appreciated on exam. Continue Diltiazem  ER, add  Coreg  as above. Monitor lightheadedness, if persists despite treatment of VT/SVT consider referral to Dr. Santo.   HTN - BP not at goal. Resume carvedilol , as above. Continue Diltiazem  ER 240mg  daily, olmesartan  40mg  daily. Discussed to monitor BP at home at least 2 hours after medications and sitting for 5-10 minutes.   HLD - continue crestor  20mg  daily per PCP.   PAD - previously followed by [facility]. ABI 11/2021 0.92 R and 0.92 L both biphasic. She was started on Pletal . By review of pill pack, no longer taking pletal  but reports no claudication. As no claudication, will defer further mgmt to PCP and/or [facility].  Tobacco use - continued cessation encouraged       Dispo: follow up in 2 mos with Dr. Sheena  Signed, Reche GORMAN Finder, NP

## 2023-08-27 NOTE — Patient Instructions (Addendum)
 Medication Instructions:   START Carvedilol  12.5mg  twice per day   *If you need a refill on your cardiac medications before your next appointment, please call your pharmacy*  Lab Work: Your physician recommends that you return for lab work today: BMET, magnesium , TSH, CBC   If you have labs (blood work) drawn today and your tests are completely normal, you will receive your results only by: MyChart Message (if you have MyChart) OR A paper copy in the mail If you have any lab test that is abnormal or we need to change your treatment, we will call you to review the results.  Testing/Procedures: Your monitor showed episodes of SVT and VT which are fast heart beats. Your Diltiazem  helps to treat these. We are adding Carvedilol  (Coreg ) to help relax the heart and prevent these fast heart beats. It will also help your blood pressure.   Follow-Up: At [facility], you and your health needs are our priority.  As part of our continuing mission to provide you with exceptional heart care, our providers are all part of one team.  This team includes your primary Cardiologist (physician) and Advanced Practice Providers or APPs (Physician Assistants and Nurse Practitioners) who all work together to provide you with the care you need, when you need it.  Your next appointment:   2 month(s)  Provider:   Kardie Tobb, DO    We recommend signing up for the patient portal called MyChart.  Sign up information is provided on this After Visit Summary.  MyChart is used to connect with patients for Virtual Visits (Telemedicine).  Patients are able to view lab/test results, encounter notes, upcoming appointments, etc.  Non-urgent messages can be sent to your provider as well.   To learn more about what you can do with MyChart, go to [email or web address].   Other Instructions  To prevent palpitations: Make sure you are adequately hydrated.  Avoid and/or limit caffeine containing beverages like soda  or tea. Exercise regularly.  Manage stress well. Some over the counter medications can cause palpitations such as Benadryl, AdvilPM, TylenolPM. Regular Advil or Tylenol  do not cause palpitations.

## 2023-08-28 LAB — CBC
Hematocrit: 42 % (ref 34.0–46.6)
Hemoglobin: 13.5 g/dL (ref 11.1–15.9)
MCH: 29.1 pg (ref 26.6–33.0)
MCHC: 32.1 g/dL (ref 31.5–35.7)
MCV: 91 fL (ref 79–97)
Platelets: 185 x10E3/uL (ref 150–450)
RBC: 4.64 x10E6/uL (ref 3.77–5.28)
RDW: 12.9 % (ref 11.7–15.4)
WBC: 8.9 x10E3/uL (ref 3.4–10.8)

## 2023-08-28 LAB — TSH: TSH: 0.741 u[IU]/mL (ref 0.450–4.500)

## 2023-08-28 LAB — BASIC METABOLIC PANEL WITH GFR
BUN/Creatinine Ratio: 19 (ref 12–28)
BUN: 20 mg/dL (ref 8–27)
CO2: 16 mmol/L — ABNORMAL LOW (ref 20–29)
Calcium: 9.8 mg/dL (ref 8.7–10.3)
Chloride: 98 mmol/L (ref 96–106)
Creatinine, Ser: 1.06 mg/dL — ABNORMAL HIGH (ref 0.57–1.00)
Glucose: 466 mg/dL — ABNORMAL HIGH (ref 70–99)
Potassium: 4.4 mmol/L (ref 3.5–5.2)
Sodium: 133 mmol/L — ABNORMAL LOW (ref 134–144)
eGFR: 54 mL/min/1.73 — ABNORMAL LOW (ref >59)

## 2023-08-28 LAB — MAGNESIUM: Magnesium: 1.8 mg/dL (ref 1.6–2.3)

## 2023-08-30 ENCOUNTER — Ambulatory Visit (HOSPITAL_BASED_OUTPATIENT_CLINIC_OR_DEPARTMENT_OTHER): Payer: Self-pay | Admitting: Family

## 2023-08-31 ENCOUNTER — Other Ambulatory Visit: Payer: Self-pay | Admitting: Family Medicine

## 2023-08-31 DIAGNOSIS — K7460 Unspecified cirrhosis of liver: Secondary | ICD-10-CM

## 2023-09-08 ENCOUNTER — Other Ambulatory Visit

## 2023-09-15 ENCOUNTER — Encounter (HOSPITAL_BASED_OUTPATIENT_CLINIC_OR_DEPARTMENT_OTHER): Payer: Self-pay | Admitting: Emergency Medicine

## 2023-09-15 ENCOUNTER — Emergency Department (HOSPITAL_BASED_OUTPATIENT_CLINIC_OR_DEPARTMENT_OTHER)

## 2023-09-15 ENCOUNTER — Other Ambulatory Visit: Payer: Self-pay

## 2023-09-15 ENCOUNTER — Emergency Department (HOSPITAL_BASED_OUTPATIENT_CLINIC_OR_DEPARTMENT_OTHER)
Admission: EM | Admit: 2023-09-15 | Discharge: 2023-09-15 | Disposition: A | Discharge status: Home / Self Care | Source: Ambulatory Visit

## 2023-09-15 DIAGNOSIS — Z79899 Other long term (current) drug therapy: Secondary | ICD-10-CM | POA: Insufficient documentation

## 2023-09-15 DIAGNOSIS — R110 Nausea: Secondary | ICD-10-CM | POA: Diagnosis present

## 2023-09-15 DIAGNOSIS — R200 Anesthesia of skin: Secondary | ICD-10-CM | POA: Diagnosis not present

## 2023-09-15 DIAGNOSIS — I10 Essential (primary) hypertension: Secondary | ICD-10-CM | POA: Insufficient documentation

## 2023-09-15 DIAGNOSIS — E1310 Other specified diabetes mellitus with ketoacidosis without coma: Secondary | ICD-10-CM | POA: Diagnosis not present

## 2023-09-15 LAB — URINALYSIS, ROUTINE W REFLEX MICROSCOPIC
Bacteria, UA: NONE SEEN
Bilirubin Urine: NEGATIVE
Glucose, UA: >1000 mg/dL — AB
Ketones, ur: 15 mg/dL — AB
Leukocytes,Ua: NEGATIVE
Nitrite: NEGATIVE
Specific Gravity, Urine: 1.032 — ABNORMAL HIGH (ref 1.005–1.030)
pH: 6 (ref 5.0–8.0)

## 2023-09-15 LAB — BASIC METABOLIC PANEL WITH GFR
Anion gap: 17 — ABNORMAL HIGH (ref 5–15)
Anion gap: 14 (ref 5–15)
BUN: 13 mg/dL (ref 8–23)
BUN: 11 mg/dL (ref 8–23)
CO2: 19 mmol/L — ABNORMAL LOW (ref 22–32)
CO2: 22 mmol/L (ref 22–32)
Calcium: 10.1 mg/dL (ref 8.9–10.3)
Calcium: 9.7 mg/dL (ref 8.9–10.3)
Chloride: 102 mmol/L (ref 98–111)
Chloride: 104 mmol/L (ref 98–111)
Creatinine, Ser: 1.04 mg/dL — ABNORMAL HIGH (ref 0.44–1.00)
Creatinine, Ser: 0.94 mg/dL (ref 0.44–1.00)
GFR, Estimated: 55 mL/min — ABNORMAL LOW (ref >60)
GFR, Estimated: >60 mL/min (ref >60)
Glucose, Bld: 390 mg/dL — ABNORMAL HIGH (ref 70–99)
Glucose, Bld: 285 mg/dL — ABNORMAL HIGH (ref 70–99)
Potassium: 3.5 mmol/L (ref 3.5–5.1)
Potassium: 3.6 mmol/L (ref 3.5–5.1)
Sodium: 138 mmol/L (ref 135–145)
Sodium: 139 mmol/L (ref 135–145)

## 2023-09-15 LAB — CBC
HCT: 37.1 % (ref 36.0–46.0)
Hemoglobin: 12.7 g/dL (ref 12.0–15.0)
MCH: 29.1 pg (ref 26.0–34.0)
MCHC: 34.2 g/dL (ref 30.0–36.0)
MCV: 85.1 fL (ref 80.0–100.0)
Platelets: 219 K/uL (ref 150–400)
RBC: 4.36 MIL/uL (ref 3.87–5.11)
RDW: 13.7 % (ref 11.5–15.5)
WBC: 6.9 K/uL (ref 4.0–10.5)
nRBC: 0 % (ref 0.0–0.2)

## 2023-09-15 MED ORDER — LACTATED RINGERS IV BOLUS
1000.0000 mL | Freq: Once | INTRAVENOUS | Status: AC
  Administered 2023-09-15: 1000 mL via INTRAVENOUS

## 2023-09-15 MED ORDER — MECLIZINE HCL 25 MG PO TABS
25.0000 mg | ORAL_TABLET | Freq: Once | ORAL | Status: AC
  Administered 2023-09-15: 25 mg via ORAL
  Filled 2023-09-15: qty 1

## 2023-09-15 MED ORDER — ONDANSETRON HCL 4 MG/2ML IJ SOLN
4.0000 mg | Freq: Once | INTRAMUSCULAR | Status: AC
  Administered 2023-09-15: 4 mg via INTRAVENOUS
  Filled 2023-09-15: qty 2

## 2023-09-15 MED ORDER — METFORMIN HCL 500 MG PO TABS: 500.0000 mg | ORAL_TABLET | Freq: Two times a day (BID) | ORAL | 0 refills | Status: DC

## 2023-09-15 NOTE — Discharge Instructions (Addendum)
 Please start the medication for diabetes twice daily.  Please follow-up with your doctor as scheduled and return to the emergency department for worsening symptoms.

## 2023-09-15 NOTE — ED Triage Notes (Signed)
 Reports R sided weakness w/ nausea and dizziness since 8/18. States symptoms of weakness have resolved since.   Patient appears to have problems completing sentences.

## 2023-09-15 NOTE — ED Notes (Signed)
 Patient ambulatory to the bathroom with minimal assist. No difficulty reported.

## 2023-09-15 NOTE — ED Provider Notes (Addendum)
 [place] EMERGENCY DEPARTMENT AT [facility] Provider Note   CSN: [id number] Arrival date & time: 09/15/23  1046     Patient presents with: Extremity Weakness   Kylie Torres is a 78 y.o. female.   78 year old female with past medical history of hypertension and cirrhosis presenting to the emergency department today with concern for some dizziness as well as some nausea and weakness primarily in her right lower extremity.  The patient states that this began 48 hours ago.  She states that she did have an increase in her gabapentin  dose at that time.  She states that she had some pain in her right leg as well as some numbness and weakness and was having difficulty walking.  She states that she has been having some dizziness over the past 2 days as well.  She has had nausea without any vomiting.  Denies any weakness in her upper extremity.  There was something mention about her having some speech difficulties but the patient is denying these currently or over the past 2 days.  She came to the emergency department further evaluation.  Denies any associated headaches.   Extremity Weakness       Prior to Admission medications   Medication Sig Start Date End Date Taking? Authorizing Provider  diltiazem  (DILACOR XR ) 240 MG 24 hr capsule Take 240 mg by mouth daily. 09/08/23  Yes [provider]  gabapentin  (NEURONTIN ) 300 MG capsule Take 300 mg by mouth daily. 09/09/23  Yes [provider]  losartan  (COZAAR ) 100 MG tablet Take 100 mg by mouth daily. 06/15/23  Yes [provider]  albuterol  (VENTOLIN  HFA) 108 (90 Base) MCG/ACT inhaler Inhale 2 puffs into the lungs every 6 (six) hours as needed for wheezing or shortness of breath. 12/11/22   Nooruddin, Saad, MD  CARTIA  XT 240 MG 24 hr capsule TAKE 1 CAPSULE BY MOUTH DAILY 11/13/22   McLendon, Michael, MD  carvedilol  (COREG ) 12.5 MG tablet Take 1 tablet (12.5 mg total) by mouth 2 (two) times daily. 08/27/23 11/25/23   Vannie Reche RAMAN, NP  gabapentin  (NEURONTIN ) 100 MG capsule Take 3 capsules (300 mg total) by mouth at bedtime for 10 days. 07/07/23 08/27/23  Soto, Johana, PA-C  meloxicam (MOBIC) 7.5 MG tablet Take 7.5 mg by mouth daily. 08/13/23   [provider]  olmesartan  (BENICAR ) 40 MG tablet Take 1 tablet (40 mg total) by mouth daily for 10 days. 07/07/23 08/27/23  Soto, Johana, PA-C  pantoprazole  (PROTONIX ) 40 MG tablet TAKE 1 TABLET BY MOUTH DAILY  BEFORE BREAKFAST 06/28/23   Kandis Perkins, DO  rosuvastatin  (CRESTOR ) 20 MG tablet TAKE 1 TABLET BY MOUTH DAILY 06/28/23   Kandis Perkins, DO  umeclidinium bromide  (INCRUSE ELLIPTA ) 62.5 MCG/ACT AEPB Inhale 1 puff into the lungs daily. 12/10/22   Nooruddin, Saad, MD    Allergies: Patient has no known allergies.    Review of Systems  Musculoskeletal:  Positive for extremity weakness.  Neurological:  Positive for numbness.  All other systems reviewed and are negative.   Updated Vital Signs BP (!) 175/71   Pulse 78   Temp 97.9 F (36.6 C) (Oral)   Resp (!) 29   SpO2 97%   Physical Exam Vitals and nursing note reviewed.   Gen: NAD Eyes: PERRL, EOMI HEENT: no oropharyngeal swelling Neck: trachea midline Resp: clear to auscultation bilaterally Card: RRR, no murmurs, rubs, or gallops Abd: nontender, nondistended Extremities: no calf tenderness, no edema Vascular: 2+ radial pulses bilaterally, 2+ DP  pulses bilaterally Neuro: Cranial nerves intact, equal strength sensation throughout bilateral upper and lower extremities with no dysmetria on finger-to-nose testing Skin: no rashes Psyc: acting appropriately   (all labs ordered are listed, but only abnormal results are displayed) Labs Reviewed  BASIC METABOLIC PANEL WITH GFR - Abnormal; Notable for the following components:      Result Value   CO2 19 (*)    Glucose, Bld 390 (*)    Creatinine, Ser 1.04 (*)    GFR, Estimated 55 (*)    Anion gap 17 (*)    All other components within normal  limits  URINALYSIS, ROUTINE W REFLEX MICROSCOPIC - Abnormal; Notable for the following components:   Specific Gravity, Urine 1.032 (*)    Glucose, UA >1,000 (*)    Hgb urine dipstick TRACE (*)    Ketones, ur 15 (*)    Protein, ur TRACE (*)    All other components within normal limits  CBC  BASIC METABOLIC PANEL WITH GFR    EKG: EKG Interpretation Date/Time:  Wednesday September 15 2023 11:10:36 EDT Ventricular Rate:  61 PR Interval:  168 QRS Duration:  93 QT Interval:  455 QTC Calculation: 459 R Axis:   25  Text Interpretation: Sinus rhythm Consider left atrial enlargement Probable LVH with secondary repol abnrm Confirmed by Ula Barter [phone]) on 09/15/2023 12:32:53 PM  Radiology: CT Head Wo Contrast Result Date: 09/15/2023 EXAM: CT HEAD WITHOUT CONTRAST 09/15/2023 11:38:12 AM TECHNIQUE: CT of the head was performed without the administration of intravenous contrast. Automated exposure control, iterative reconstruction, and/or weight based adjustment of the mA/kV was utilized to reduce the radiation dose to as low as reasonably achievable. COMPARISON: None available. CLINICAL HISTORY: Neuro deficit, acute, stroke suspected. R sided weakness w/ nausea and dizziness since 8/18. States symptoms of weakness have resolved since. Patient appears to have problems completing sentences. FINDINGS: BRAIN AND VENTRICLES: No acute hemorrhage. Gray-white differentiation is preserved. No hydrocephalus. No extra-axial collection. No mass effect or midline shift. There is moderate cerebral volume loss and extensive periventricular and deep cerebral white matter disease present. ORBITS: Patient is status post bilateral lens replacement. SINUSES: A polypoid mucosal density is present in the left maxillary sinus. SOFT TISSUES AND SKULL: No acute soft tissue abnormality. No skull fracture. There are ves- vascular calcifications within the carotid siphons. IMPRESSION: 1. No acute intracranial abnormality. 2.  Moderate cerebral volume loss and extensive periventricular and deep cerebral white matter disease. Electronically signed by: Evalene Coho MD 09/15/2023 11:44 AM EDT RP Workstation: [id number]     Procedures   Medications Ordered in the ED  lactated ringers  bolus 1,000 mL ( Intravenous Stopped 09/15/23 1410)  meclizine  (ANTIVERT ) tablet 25 mg (25 mg Oral Given 09/15/23 1157)  ondansetron  (ZOFRAN ) injection 4 mg (4 mg Intravenous Given 09/15/23 1201)  lactated ringers  bolus 1,000 mL (1,000 mLs Intravenous New Bag/Given 09/15/23 1423)                                    Medical Decision Making 78 year old female with past medical history of hypertension and cirrhosis presenting to the emergency department today with dizziness as well as some right lower extremity numbness and weakness that is since resolved.  The patient was having some pain with this as well.  I will further evaluate her here with basic labs to evaluate for anemia or electrolyte abnormality as well as CT scan of her head  to evaluate for intracranial hemorrhage or mass lesion.  Suspicion for CVA is relatively low as her NIH stroke scale is a 0 currently.  Will give her some Zofran  for her nausea and obtain an EKG here as well to evaluate for conduction abnormalities or ischemia although she is not complaining of any chest pain at this time.  She is somewhat of a difficult historian.  I will give her meclizine  and some IV fluids and reevaluate for ultimate disposition.  The initial labs are revealing for a very mild anion gap with some hyperglycemia and small amount of ketones in the urine.  It is likely indicative of very mild DKA.  The patient CT scan is negative.  I did reassess the patient and discussed admission to the hospital but the patient does not want to be admitted.  She states he is feeling much better.  Still having some very mild dizziness.  I will give the patient additional liter of fluids and repeat a BMP.  Will  reattempt ambulation with the patient here and see how she is feeling.  If she is still symptomatic she will likely require further evaluation with MRI but if her symptoms resolve I think there is a good chance that her symptoms were due to mild DKA.  This will hopefully resolve with fluids alone here as her anion gap is only 17 and she has mild ketonuria.    The patient's anion gap closed and she is back to her baseline on reassessment.  She is ambulatory here and feels back to normal.  Her doctor did check her hemoglobin A1c and were planning on following up.  She is not currently on anything for diabetes.  Will start patient on metformin  I think she is safe for discharge.  CRITICAL CARE Performed by: Prentice JONELLE Medicus   Total critical care time: 30 minutes  Critical care time was exclusive of separately billable procedures and treating other patients.  Critical care was necessary to treat or prevent imminent or life-threatening deterioration.  Critical care was time spent personally by me on the following activities: development of treatment plan with patient and/or surrogate as well as nursing, discussions with consultants, evaluation of patient's response to treatment, examination of patient, obtaining history from patient or surrogate, ordering and performing treatments and interventions, ordering and review of laboratory studies, ordering and review of radiographic studies, pulse oximetry and re-evaluation of patient's condition.   Amount and/or Complexity of Data Reviewed Labs: ordered. Radiology: ordered.  Risk Prescription drug management.        Final diagnoses:  Diabetic ketoacidosis without coma associated with other specified diabetes mellitus [facility])    ED Discharge Orders     None          Medicus Prentice JONELLE, MD 09/15/23 1440    Medicus Prentice JONELLE, MD 09/15/23 [phone]

## 2023-09-20 ENCOUNTER — Ambulatory Visit
Admission: RE | Admit: 2023-09-20 | Discharge: 2023-09-20 | Disposition: A | Discharge status: Home / Self Care | Source: Ambulatory Visit | Attending: Family Medicine | Admitting: Family Medicine

## 2023-09-20 DIAGNOSIS — K7460 Unspecified cirrhosis of liver: Secondary | ICD-10-CM

## 2023-11-16 ENCOUNTER — Ambulatory Visit: Attending: Cardiology | Admitting: Cardiology

## 2023-11-25 ENCOUNTER — Ambulatory Visit: Attending: Cardiology | Admitting: Cardiology

## 2023-11-25 ENCOUNTER — Encounter: Payer: Self-pay | Admitting: Cardiology

## 2023-11-25 VITALS — BP 151/80 | HR 58 | Ht 62.0 in | Wt 141.5 lb

## 2023-11-25 DIAGNOSIS — I422 Other hypertrophic cardiomyopathy: Secondary | ICD-10-CM

## 2023-11-25 DIAGNOSIS — I10 Essential (primary) hypertension: Secondary | ICD-10-CM

## 2023-11-25 DIAGNOSIS — E782 Mixed hyperlipidemia: Secondary | ICD-10-CM

## 2023-11-25 MED ORDER — VERAPAMIL HCL ER 240 MG PO TBCR: 240.0000 mg | EXTENDED_RELEASE_TABLET | Freq: Every day | ORAL | 3 refills | Status: DC

## 2023-11-25 NOTE — Progress Notes (Signed)
 Cardiology Office Note:    Date:  11/30/2023   ID:  Kylie Torres, DOB 1945-08-18, MRN [id number]  PCP:  Haze Kingfisher, MD  Cardiologist:  Dub Huntsman, DO  Electrophysiologist:  None   Referring MD: Haze Kingfisher, MD     I am ok  History of Present Illness:    Kylie Torres is a 78 y.o. female with a hx of hypertrophic cardiomyopathy, hypertension, hyperlipidemia hypertensive retinopathy, peripheral artery disease, cirrhosis.  Since her last visit with me she has followed with Reche Finder, NP at that time she was experiencing some NSVT and VT Coreg  was resumed 25 mg twice daily.  She appeared to be euvolemic.  Today she does not have any specific concerns.  Past Medical History:  Diagnosis Date   Acute pain of left shoulder 07/18/2019   Alcohol abuse    Blood transfusion without reported diagnosis    Cirrhosis (HCC)    Gastric ulcer    Heart murmur    Hypertension    Hypertensive retinopathy    OU   Peripheral arterial disease     Past Surgical History:  Procedure Laterality Date   ABDOMINAL HYSTERECTOMY     BREAST EXCISIONAL BIOPSY Left 1971   benign   CATARACT EXTRACTION Bilateral    Dr. Octavia   COLONOSCOPY  04/29/2021   COLONOSCOPY W/ POLYPECTOMY  03/23/2016   ESOPHAGOGASTRODUODENOSCOPY (EGD) WITH PROPOFOL  N/A 04/17/2015   Procedure: ESOPHAGOGASTRODUODENOSCOPY (EGD) WITH PROPOFOL ;  Surgeon: Gwendlyn ONEIDA Buddy, MD;  Location: [facility];  Service: Endoscopy;  Laterality: N/A;   EYE SURGERY Bilateral    Cat Sx   OOPHORECTOMY      Current Medications: Current Meds  Medication Sig   albuterol  (VENTOLIN  HFA) 108 (90 Base) MCG/ACT inhaler Inhale 2 puffs into the lungs every 6 (six) hours as needed for wheezing or shortness of breath.   CARTIA  XT 240 MG 24 hr capsule TAKE 1 CAPSULE BY MOUTH DAILY   carvedilol  (COREG ) 12.5 MG tablet Take 1 tablet (12.5 mg total) by mouth 2 (two) times daily.   gabapentin  (NEURONTIN ) 100 MG capsule Take 3 capsules (300 mg  total) by mouth at bedtime for 10 days.   gabapentin  (NEURONTIN ) 300 MG capsule Take 300 mg by mouth daily.   losartan  (COZAAR ) 100 MG tablet Take 100 mg by mouth daily.   meloxicam (MOBIC) 7.5 MG tablet Take 7.5 mg by mouth daily.   metFORMIN  (GLUCOPHAGE ) 500 MG tablet Take 1 tablet (500 mg total) by mouth 2 (two) times daily with a meal.   olmesartan  (BENICAR ) 40 MG tablet Take 1 tablet (40 mg total) by mouth daily for 10 days.   pantoprazole  (PROTONIX ) 40 MG tablet TAKE 1 TABLET BY MOUTH DAILY  BEFORE BREAKFAST   rosuvastatin  (CRESTOR ) 20 MG tablet TAKE 1 TABLET BY MOUTH DAILY   umeclidinium bromide  (INCRUSE ELLIPTA ) 62.5 MCG/ACT AEPB Inhale 1 puff into the lungs daily.   verapamil (CALAN-SR) 240 MG CR tablet Take 1 tablet (240 mg total) by mouth at bedtime.   [DISCONTINUED] diltiazem  (DILACOR XR ) 240 MG 24 hr capsule Take 240 mg by mouth daily.     Allergies:   Patient has no known allergies.   Social History   Socioeconomic History   Marital status: Divorced    Spouse name: Not on file   Number of children: 3   Years of education: Not on file   Highest education level: Not on file  Occupational History   Occupation: retired  Tobacco Use  Smoking status: Former    Current packs/day: 0.00    Average packs/day: 1 pack/day for 30.1 years (30.1 ttl pk-yrs)    Types: Cigarettes    Start date: 01/27/2019    Quit date: 09/2022    Years since quitting: 1.1   Smokeless tobacco: Never   Tobacco comments:    Smoked cigarette this am 7:30 ish  Vaping Use   Vaping status: Never Used  Substance and Sexual Activity   Alcohol use: No    Comment: quit drinking in 03/2015 - previously 1/5 gin every 2 days   Drug use: No   Sexual activity: Not Currently  Other Topics Concern   Not on file  Social History Narrative   Current Social History 05/18/2019        Patient lives alone in an apartment which is 2 stories. There are steps with hand rails which the patient uses.       Patient's  method of transportation is via her daughter.      The highest level of education was high school diploma.      The patient currently retired.      Identified important Relationships are with her daughter, a good friend who calls her daily, and a neighbor.      Pets : No       Interests / Fun: Watching TV and reading about various subjects       Current Stressors: None       Religious / Personal Beliefs: Holliness       Other: Will I get to see Dr. Herold again before she leaves?  Pt informed she has an upcoming appointment with Dr. Herold on 07/03/2019 @ 1:15 and this would be included in her AVS.   SChaplin, RN,BSN       Social Drivers of Health   Financial Resource Strain: Low Risk  (09/23/2022)   Overall Financial Resource Strain (CARDIA)    Difficulty of Paying Living Expenses: Not hard at all  Food Insecurity: No Food Insecurity (09/23/2022)   Hunger Vital Sign    Worried About Running Out of Food in the Last Year: Never true    Ran Out of Food in the Last Year: Never true  Transportation Needs: No Transportation Needs (09/23/2022)   PRAPARE - Administrator, Civil Service (Medical): No    Lack of Transportation (Non-Medical): No  Physical Activity: Inactive (09/23/2022)   Exercise Vital Sign    Days of Exercise per Week: 0 days    Minutes of Exercise per Session: 0 min  Stress: No Stress Concern Present (09/23/2022)   [employer] of Occupational Health - Occupational Stress Questionnaire    Feeling of Stress : Not at all  Social Connections: Socially Isolated (09/23/2022)   Social Connection and Isolation Panel    Frequency of Communication with Friends and Family: More than three times a week    Frequency of Social Gatherings with Friends and Family: Never    Attends Religious Services: Never    Database Administrator or Organizations: No    Attends Engineer, Structural: Never    Marital Status: Divorced     Family History: The patient's  family history includes Breast cancer in her maternal aunt; Breast cancer (age of onset: 64) in her mother; Cancer in her mother; Diabetes in her sister; Heart attack in her brother and father; Hypertension in her father and sister; Stomach cancer in her maternal aunt. There is no history of  Colon cancer, Esophageal cancer, or Rectal cancer.  ROS:   Review of Systems  Constitution: Negative for decreased appetite, fever and weight gain.  HENT: Negative for congestion, ear discharge, hoarse voice and sore throat.   Eyes: Negative for discharge, redness, vision loss in right eye and visual halos.  Cardiovascular: Negative for chest pain, dyspnea on exertion, leg swelling, orthopnea and palpitations.  Respiratory: Negative for cough, hemoptysis, shortness of breath and snoring.   Endocrine: Negative for heat intolerance and polyphagia.  Hematologic/Lymphatic: Negative for bleeding problem. Does not bruise/bleed easily.  Skin: Negative for flushing, nail changes, rash and suspicious lesions.  Musculoskeletal: Negative for arthritis, joint pain, muscle cramps, myalgias, neck pain and stiffness.  Gastrointestinal: Negative for abdominal pain, bowel incontinence, diarrhea and excessive appetite.  Genitourinary: Negative for decreased libido, genital sores and incomplete emptying.  Neurological: Negative for brief paralysis, focal weakness, headaches and loss of balance.  Psychiatric/Behavioral: Negative for altered mental status, depression and suicidal ideas.  Allergic/Immunologic: Negative for HIV exposure and persistent infections.    EKGs/Labs/Other Studies Reviewed:    The following studies were reviewed today:   EKG:  The ekg ordered today demonstrates   Recent Labs: 11/30/2022: B Natriuretic Peptide 562.8 07/07/2023: ALT 8; Pro Brain Natriuretic Peptide 874.0 08/27/2023: Magnesium  1.8; TSH 0.741 09/15/2023: BUN 11; Creatinine, Ser 0.94; Hemoglobin 12.7; Platelets 219; Potassium 3.6; Sodium  139  Recent Lipid Panel    Component Value Date/Time   CHOL 121 12/05/2021 0946   TRIG 61 12/05/2021 0946   HDL 44 12/05/2021 0946   CHOLHDL 2.8 12/05/2021 0946   LDLCALC 64 12/05/2021 0946    Physical Exam:    VS:  BP (!) 151/80   Pulse (!) 58   Ht 5' 2 (1.575 m)   Wt 141 lb 8 oz (64.2 kg)   SpO2 95%   BMI 25.88 kg/m     Wt Readings from Last 3 Encounters:  11/25/23 141 lb 8 oz (64.2 kg)  08/27/23 142 lb 6.4 oz (64.6 kg)  01/29/23 145 lb (65.8 kg)     GEN: Well nourished, well developed in no acute distress HEENT: Normal NECK: No JVD; No carotid bruits LYMPHATICS: No lymphadenopathy CARDIAC: S1S2 noted,RRR, no murmurs, rubs, gallops RESPIRATORY:  Clear to auscultation without rales, wheezing or rhonchi  ABDOMEN: Soft, non-tender, non-distended, +bowel sounds, no guarding. EXTREMITIES: No edema, No cyanosis, no clubbing MUSCULOSKELETAL:  No deformity  SKIN: Warm and dry NEUROLOGIC:  Alert and oriented x 3, non-focal PSYCHIATRIC:  Normal affect, good insight  ASSESSMENT:    1. Hypertrophic nonobstructive cardiomyopathy (HCC)   2. Primary hypertension   3. Mixed hyperlipidemia    PLAN:     She is still experiencing shortness of breath despite the Coreg  adjustments.  Will stop Cardizem  and start verapamil to 40 mg daily hopefully this will help.  Continue current dose of carvedilol .  Plan will be to stop the losartan  in the setting of her HCM to see if this will help her to be less symptomatic.  If symptoms does not improve we will refer her to our HCM clinic with Dr. Santo.  \ Also monitor blood pressure on the new dosage of medication and adjust as appropriate  Hyperlipidemia - continue with current statin medication.  The patient is in agreement with the above plan. The patient left the office in stable condition.  The patient will follow up in   Medication Adjustments/Labs and Tests Ordered: Current medicines are reviewed at length with the  patient  today.  Concerns regarding medicines are outlined above.  No orders of the defined types were placed in this encounter.  Meds ordered this encounter  Medications   verapamil (CALAN-SR) 240 MG CR tablet    Sig: Take 1 tablet (240 mg total) by mouth at bedtime.    Dispense:  90 tablet    Refill:  3    Patient Instructions  Medication Instructions:  Your physician has recommended you make the following change in your medication:  STOP: Diltiazem   START: Verapamil 240 mg once daily  *If you need a refill on your cardiac medications before your next appointment, please call your pharmacy*   Follow-Up: At [facility], you and your health needs are our priority.  As part of our continuing mission to provide you with exceptional heart care, our providers are all part of one team.  This team includes your primary Cardiologist (physician) and Advanced Practice Providers or APPs (Physician Assistants and Nurse Practitioners) who all work together to provide you with the care you need, when you need it.  Your next appointment:    December 4th at 8:40pm  Provider:   Ephrem Carrick, DO          Adopting a Healthy Lifestyle.  Know what a healthy weight is for you (roughly BMI <25) and aim to maintain this   Aim for 7+ servings of fruits and vegetables daily   65-80+ fluid ounces of water or unsweet tea for healthy kidneys   Limit to max 1 drink of alcohol per day; avoid smoking/tobacco   Limit animal fats in diet for cholesterol and heart health - choose grass fed whenever available   Avoid highly processed foods, and foods high in saturated/trans fats   Aim for low stress - take time to unwind and care for your mental health   Aim for 150 min of moderate intensity exercise weekly for heart health, and weights twice weekly for bone health   Aim for 7-9 hours of sleep daily   When it comes to diets, agreement about the perfect plan isnt easy to find, even among the  experts. Experts at the [facility] [employer] developed an idea known as the Healthy Eating Plate. Just imagine a plate divided into logical, healthy portions.   The emphasis is on diet quality:   Load up on vegetables and fruits - one-half of your plate: Aim for color and variety, and remember that potatoes dont count.   Go for whole grains - one-quarter of your plate: Whole wheat, barley, wheat berries, quinoa, oats, brown rice, and foods made with them. If you want pasta, go with whole wheat pasta.   Protein power - one-quarter of your plate: Fish, chicken, beans, and nuts are all healthy, versatile protein sources. Limit red meat.   The diet, however, does go beyond the plate, offering a few other suggestions.   Use healthy plant oils, such as olive, canola, soy, corn, sunflower and peanut. Check the labels, and avoid partially hydrogenated oil, which have unhealthy trans fats.   If youre thirsty, drink water. Coffee and tea are good in moderation, but skip sugary drinks and limit milk and dairy products to one or two daily servings.   The type of carbohydrate in the diet is more important than the amount. Some sources of carbohydrates, such as vegetables, fruits, whole grains, and beans-are healthier than others.   Finally, stay active  Signed, Rafel Garde, DO  11/30/2023 1:31 PM  [facility]

## 2023-11-25 NOTE — Patient Instructions (Addendum)
 Medication Instructions:  Your physician has recommended you make the following change in your medication:  STOP: Diltiazem   START: Verapamil 240 mg once daily  *If you need a refill on your cardiac medications before your next appointment, please call your pharmacy*   Follow-Up: At [facility], you and your health needs are our priority.  As part of our continuing mission to provide you with exceptional heart care, our providers are all part of one team.  This team includes your primary Cardiologist (physician) and Advanced Practice Providers or APPs (Physician Assistants and Nurse Practitioners) who all work together to provide you with the care you need, when you need it.  Your next appointment:    December 4th at 8:40pm  Provider:   Kardie Tobb, DO

## 2023-12-30 ENCOUNTER — Ambulatory Visit: Attending: Cardiology | Admitting: Cardiology

## 2024-02-18 ENCOUNTER — Observation Stay (HOSPITAL_COMMUNITY)
Admission: EM | Admit: 2024-02-18 | Discharge: 2024-02-19 | Disposition: A | Attending: Emergency Medicine | Admitting: Emergency Medicine

## 2024-02-18 ENCOUNTER — Other Ambulatory Visit: Payer: Self-pay

## 2024-02-18 ENCOUNTER — Emergency Department (HOSPITAL_COMMUNITY)

## 2024-02-18 ENCOUNTER — Encounter (HOSPITAL_COMMUNITY): Payer: Self-pay | Admitting: Internal Medicine

## 2024-02-18 DIAGNOSIS — J441 Chronic obstructive pulmonary disease with (acute) exacerbation: Principal | ICD-10-CM

## 2024-02-18 DIAGNOSIS — I5032 Chronic diastolic (congestive) heart failure: Secondary | ICD-10-CM | POA: Diagnosis not present

## 2024-02-18 DIAGNOSIS — Z8719 Personal history of other diseases of the digestive system: Secondary | ICD-10-CM | POA: Diagnosis not present

## 2024-02-18 DIAGNOSIS — Z79899 Other long term (current) drug therapy: Secondary | ICD-10-CM | POA: Insufficient documentation

## 2024-02-18 DIAGNOSIS — J9601 Acute respiratory failure with hypoxia: Secondary | ICD-10-CM | POA: Diagnosis not present

## 2024-02-18 DIAGNOSIS — I110 Hypertensive heart disease with heart failure: Secondary | ICD-10-CM | POA: Insufficient documentation

## 2024-02-18 DIAGNOSIS — E119 Type 2 diabetes mellitus without complications: Secondary | ICD-10-CM | POA: Insufficient documentation

## 2024-02-18 DIAGNOSIS — Z8679 Personal history of other diseases of the circulatory system: Secondary | ICD-10-CM | POA: Diagnosis not present

## 2024-02-18 DIAGNOSIS — I10 Essential (primary) hypertension: Secondary | ICD-10-CM

## 2024-02-18 DIAGNOSIS — I471 Supraventricular tachycardia, unspecified: Secondary | ICD-10-CM

## 2024-02-18 DIAGNOSIS — I472 Ventricular tachycardia, unspecified: Secondary | ICD-10-CM

## 2024-02-18 DIAGNOSIS — E785 Hyperlipidemia, unspecified: Secondary | ICD-10-CM | POA: Diagnosis not present

## 2024-02-18 DIAGNOSIS — I422 Other hypertrophic cardiomyopathy: Secondary | ICD-10-CM | POA: Insufficient documentation

## 2024-02-18 DIAGNOSIS — I739 Peripheral vascular disease, unspecified: Secondary | ICD-10-CM | POA: Diagnosis not present

## 2024-02-18 DIAGNOSIS — Z87891 Personal history of nicotine dependence: Secondary | ICD-10-CM | POA: Insufficient documentation

## 2024-02-18 DIAGNOSIS — R0602 Shortness of breath: Secondary | ICD-10-CM | POA: Diagnosis not present

## 2024-02-18 DIAGNOSIS — F17200 Nicotine dependence, unspecified, uncomplicated: Secondary | ICD-10-CM

## 2024-02-18 LAB — CBC
HCT: 39.2 % (ref 36.0–46.0)
Hemoglobin: 12.1 g/dL (ref 12.0–15.0)
MCH: 27.9 pg (ref 26.0–34.0)
MCHC: 30.9 g/dL (ref 30.0–36.0)
MCV: 90.3 fL (ref 80.0–100.0)
Platelets: 199 K/uL (ref 150–400)
RBC: 4.34 MIL/uL (ref 3.87–5.11)
RDW: 15.9 % — ABNORMAL HIGH (ref 11.5–15.5)
WBC: 14.1 K/uL — ABNORMAL HIGH (ref 4.0–10.5)
nRBC: 0 % (ref 0.0–0.2)

## 2024-02-18 LAB — COMPREHENSIVE METABOLIC PANEL WITH GFR
ALT: 7 U/L (ref 0–44)
AST: 13 U/L — ABNORMAL LOW (ref 15–41)
Albumin: 4.5 g/dL (ref 3.5–5.0)
Alkaline Phosphatase: 91 U/L (ref 38–126)
Anion gap: 11 (ref 5–15)
BUN: 28 mg/dL — ABNORMAL HIGH (ref 8–23)
CO2: 22 mmol/L (ref 22–32)
Calcium: 10.2 mg/dL (ref 8.9–10.3)
Chloride: 109 mmol/L (ref 98–111)
Creatinine, Ser: 0.99 mg/dL (ref 0.44–1.00)
GFR, Estimated: 58 mL/min — ABNORMAL LOW
Glucose, Bld: 145 mg/dL — ABNORMAL HIGH (ref 70–99)
Potassium: 4.5 mmol/L (ref 3.5–5.1)
Sodium: 141 mmol/L (ref 135–145)
Total Bilirubin: 0.5 mg/dL (ref 0.0–1.2)
Total Protein: 7.7 g/dL (ref 6.5–8.1)

## 2024-02-18 LAB — RESP PANEL BY RT-PCR (RSV, FLU A&B, COVID)  RVPGX2
Influenza A by PCR: NEGATIVE
Influenza B by PCR: NEGATIVE
Resp Syncytial Virus by PCR: NEGATIVE
SARS Coronavirus 2 by RT PCR: NEGATIVE

## 2024-02-18 LAB — PRO BRAIN NATRIURETIC PEPTIDE: Pro Brain Natriuretic Peptide: 1643 pg/mL — ABNORMAL HIGH

## 2024-02-18 LAB — GLUCOSE, CAPILLARY: Glucose-Capillary: 342 mg/dL — ABNORMAL HIGH (ref 70–99)

## 2024-02-18 LAB — TROPONIN T, HIGH SENSITIVITY
Troponin T High Sensitivity: 12 ng/L (ref 0–19)
Troponin T High Sensitivity: 12 ng/L (ref 0–19)
Troponin T High Sensitivity: 12 ng/L (ref 0–19)

## 2024-02-18 LAB — PROCALCITONIN: Procalcitonin: 0.14 ng/mL

## 2024-02-18 LAB — STREP PNEUMONIAE URINARY ANTIGEN: Strep Pneumo Urinary Antigen: NEGATIVE

## 2024-02-18 MED ORDER — ENOXAPARIN SODIUM 40 MG/0.4ML IJ SOSY
40.0000 mg | PREFILLED_SYRINGE | INTRAMUSCULAR | Status: DC
  Administered 2024-02-18: 40 mg via SUBCUTANEOUS
  Filled 2024-02-18: qty 0.4

## 2024-02-18 MED ORDER — ONDANSETRON HCL 4 MG PO TABS: 4.0000 mg | ORAL_TABLET | Freq: Four times a day (QID) | ORAL | Status: DC | PRN

## 2024-02-18 MED ORDER — FLEET ENEMA RE ENEM: 1.0000 | ENEMA | Freq: Once | RECTAL | Status: DC | PRN

## 2024-02-18 MED ORDER — GABAPENTIN 300 MG PO CAPS: 300.0000 mg | ORAL_CAPSULE | Freq: Every day | ORAL | Status: DC

## 2024-02-18 MED ORDER — PANTOPRAZOLE SODIUM 40 MG PO TBEC
40.0000 mg | DELAYED_RELEASE_TABLET | Freq: Every day | ORAL | Status: DC
  Administered 2024-02-19: 40 mg via ORAL
  Filled 2024-02-18: qty 1

## 2024-02-18 MED ORDER — BUDESONIDE 0.25 MG/2ML IN SUSP
0.2500 mg | Freq: Two times a day (BID) | RESPIRATORY_TRACT | Status: DC
  Administered 2024-02-18 – 2024-02-19 (×2): 0.25 mg via RESPIRATORY_TRACT
  Filled 2024-02-18 (×2): qty 2

## 2024-02-18 MED ORDER — OXYCODONE HCL 5 MG PO TABS
5.0000 mg | ORAL_TABLET | ORAL | Status: DC | PRN
  Administered 2024-02-19: 5 mg via ORAL
  Filled 2024-02-18: qty 1

## 2024-02-18 MED ORDER — ALBUTEROL SULFATE (2.5 MG/3ML) 0.083% IN NEBU
10.0000 mg/h | INHALATION_SOLUTION | Freq: Once | RESPIRATORY_TRACT | Status: AC
  Administered 2024-02-18: 10 mg/h via RESPIRATORY_TRACT
  Filled 2024-02-18: qty 12

## 2024-02-18 MED ORDER — ONDANSETRON HCL 4 MG/2ML IJ SOLN: 4.0000 mg | Freq: Four times a day (QID) | INTRAMUSCULAR | Status: DC | PRN

## 2024-02-18 MED ORDER — INSULIN ASPART 100 UNIT/ML IJ SOLN
0.0000 [IU] | Freq: Every day | INTRAMUSCULAR | Status: DC
  Administered 2024-02-18: 4 [IU] via SUBCUTANEOUS
  Filled 2024-02-18: qty 4

## 2024-02-18 MED ORDER — POLYETHYLENE GLYCOL 3350 17 G PO PACK: 17.0000 g | PACK | Freq: Every day | ORAL | Status: DC | PRN

## 2024-02-18 MED ORDER — FENTANYL CITRATE (PF) 50 MCG/ML IJ SOSY: 12.5000 ug | PREFILLED_SYRINGE | INTRAMUSCULAR | Status: DC | PRN

## 2024-02-18 MED ORDER — LOSARTAN POTASSIUM 50 MG PO TABS
100.0000 mg | ORAL_TABLET | Freq: Every day | ORAL | Status: DC
  Administered 2024-02-19: 100 mg via ORAL
  Filled 2024-02-18: qty 2

## 2024-02-18 MED ORDER — SODIUM CHLORIDE 0.9 % IV SOLN
1.0000 g | Freq: Once | INTRAVENOUS | Status: AC
  Administered 2024-02-18: 1 g via INTRAVENOUS
  Filled 2024-02-18: qty 10

## 2024-02-18 MED ORDER — METHYLPREDNISOLONE SODIUM SUCC 125 MG IJ SOLR
60.0000 mg | Freq: Two times a day (BID) | INTRAMUSCULAR | Status: DC
  Administered 2024-02-18 – 2024-02-19 (×2): 60 mg via INTRAVENOUS
  Filled 2024-02-18 (×2): qty 2

## 2024-02-18 MED ORDER — DOXYCYCLINE HYCLATE 100 MG PO TABS
100.0000 mg | ORAL_TABLET | Freq: Two times a day (BID) | ORAL | Status: DC
  Administered 2024-02-18 – 2024-02-19 (×2): 100 mg via ORAL
  Filled 2024-02-18 (×2): qty 1

## 2024-02-18 MED ORDER — INSULIN ASPART 100 UNIT/ML IJ SOLN
0.0000 [IU] | Freq: Three times a day (TID) | INTRAMUSCULAR | Status: DC
  Administered 2024-02-19 (×2): 3 [IU] via SUBCUTANEOUS
  Filled 2024-02-18 (×2): qty 3

## 2024-02-18 MED ORDER — ACETAMINOPHEN 325 MG PO TABS: 650.0000 mg | ORAL_TABLET | Freq: Four times a day (QID) | ORAL | Status: DC | PRN

## 2024-02-18 MED ORDER — FUROSEMIDE 10 MG/ML IJ SOLN
40.0000 mg | Freq: Once | INTRAMUSCULAR | Status: AC
  Administered 2024-02-18: 40 mg via INTRAVENOUS
  Filled 2024-02-18: qty 4

## 2024-02-18 MED ORDER — SENNA 8.6 MG PO TABS
1.0000 | ORAL_TABLET | Freq: Two times a day (BID) | ORAL | Status: DC
  Administered 2024-02-19: 8.6 mg via ORAL
  Filled 2024-02-18 (×2): qty 1

## 2024-02-18 MED ORDER — ROSUVASTATIN CALCIUM 20 MG PO TABS
20.0000 mg | ORAL_TABLET | Freq: Every day | ORAL | Status: DC
  Administered 2024-02-19: 20 mg via ORAL
  Filled 2024-02-18: qty 1

## 2024-02-18 MED ORDER — ACETAMINOPHEN 650 MG RE SUPP: 650.0000 mg | Freq: Four times a day (QID) | RECTAL | Status: DC | PRN

## 2024-02-18 MED ORDER — ARFORMOTEROL TARTRATE 15 MCG/2ML IN NEBU
15.0000 ug | INHALATION_SOLUTION | Freq: Two times a day (BID) | RESPIRATORY_TRACT | Status: DC
  Administered 2024-02-18 – 2024-02-19 (×2): 15 ug via RESPIRATORY_TRACT
  Filled 2024-02-18 (×2): qty 2

## 2024-02-18 MED ORDER — CARVEDILOL 12.5 MG PO TABS
12.5000 mg | ORAL_TABLET | Freq: Two times a day (BID) | ORAL | Status: DC
  Administered 2024-02-18 – 2024-02-19 (×2): 12.5 mg via ORAL
  Filled 2024-02-18 (×2): qty 1

## 2024-02-18 MED ORDER — GABAPENTIN 300 MG PO CAPS
300.0000 mg | ORAL_CAPSULE | Freq: Every day | ORAL | Status: DC
  Administered 2024-02-18: 300 mg via ORAL
  Filled 2024-02-18: qty 1

## 2024-02-18 MED ORDER — MAGNESIUM SULFATE IN D5W 1-5 GM/100ML-% IV SOLN
1.0000 g | Freq: Once | INTRAVENOUS | Status: AC
  Administered 2024-02-18: 1 g via INTRAVENOUS
  Filled 2024-02-18 (×2): qty 100

## 2024-02-18 MED ORDER — ALBUTEROL SULFATE (2.5 MG/3ML) 0.083% IN NEBU
INHALATION_SOLUTION | RESPIRATORY_TRACT | Status: AC
  Filled 2024-02-18: qty 3

## 2024-02-18 MED ORDER — DILTIAZEM HCL ER COATED BEADS 240 MG PO CP24: 240.0000 mg | ORAL_CAPSULE | Freq: Every day | ORAL | Status: DC

## 2024-02-18 MED ORDER — IPRATROPIUM-ALBUTEROL 0.5-2.5 (3) MG/3ML IN SOLN
3.0000 mL | Freq: Four times a day (QID) | RESPIRATORY_TRACT | Status: DC
  Administered 2024-02-18 – 2024-02-19 (×4): 3 mL via RESPIRATORY_TRACT
  Filled 2024-02-18 (×4): qty 3

## 2024-02-18 NOTE — ED Provider Notes (Signed)
 " [place] EMERGENCY DEPARTMENT AT [facility] Provider Note   CSN: [id number] Arrival date & time: 02/18/24  9078     Patient presents with: Respiratory Distress   Kylie Torres is a 79 y.o. female.   HPI   Patient has a history of COPD, hypertension, diabetes, hypertrophic nonobstructive cardiomyopathy.  Patient presents to the ED with complaints of shortness of breath.  Patient states she started having symptoms earlier this week.  It seemed to be getting better with her breathing treatments.  Last night and this morning however she became acutely short of breath.  Patient feels a tightness in her chest like she cannot catch her breath.  She has been wheezing.  She denies coughing much.  She has not had any leg swelling.  She denies any fevers or chills Patient called EMS this morning.  She was given breathing treatments including Solu-Medrol  magnesium  albuterol  and also started on BiPAP.  Patient states she is feeling better with those treatments Prior to Admission medications  Medication Sig Start Date End Date Taking? Authorizing Provider  albuterol  (VENTOLIN  HFA) 108 (90 Base) MCG/ACT inhaler Inhale 2 puffs into the lungs every 6 (six) hours as needed for wheezing or shortness of breath. 12/11/22   Nooruddin, Saad, MD  CARTIA  XT 240 MG 24 hr capsule TAKE 1 CAPSULE BY MOUTH DAILY 11/13/22   Norrine Sharper, MD  carvedilol  (COREG ) 12.5 MG tablet Take 1 tablet (12.5 mg total) by mouth 2 (two) times daily. 08/27/23 11/25/23  Vannie Reche RAMAN, NP  gabapentin  (NEURONTIN ) 100 MG capsule Take 3 capsules (300 mg total) by mouth at bedtime for 10 days. 07/07/23 11/25/23  Soto, Johana, PA-C  gabapentin  (NEURONTIN ) 300 MG capsule Take 300 mg by mouth daily. 09/09/23   [provider]  losartan  (COZAAR ) 100 MG tablet Take 100 mg by mouth daily. 06/15/23   [provider]  meloxicam (MOBIC) 7.5 MG tablet Take 7.5 mg by mouth daily. 08/13/23   [provider]   metFORMIN  (GLUCOPHAGE ) 500 MG tablet Take 1 tablet (500 mg total) by mouth 2 (two) times daily with a meal. 09/15/23   Ula Prentice SAUNDERS, MD  olmesartan  (BENICAR ) 40 MG tablet Take 1 tablet (40 mg total) by mouth daily for 10 days. 07/07/23 11/25/23  Soto, Johana, PA-C  pantoprazole  (PROTONIX ) 40 MG tablet TAKE 1 TABLET BY MOUTH DAILY  BEFORE BREAKFAST 06/28/23   Kandis Perkins, DO  rosuvastatin  (CRESTOR ) 20 MG tablet TAKE 1 TABLET BY MOUTH DAILY 06/28/23   Kandis Perkins, DO  umeclidinium bromide  (INCRUSE ELLIPTA ) 62.5 MCG/ACT AEPB Inhale 1 puff into the lungs daily. 12/10/22   Nooruddin, Saad, MD  verapamil  (CALAN -SR) 240 MG CR tablet Take 1 tablet (240 mg total) by mouth at bedtime. 11/25/23   Tobb, Kardie, DO    Allergies: Patient has no known allergies.    Review of Systems  Updated Vital Signs BP (!) 172/84   Pulse 74   Temp 97.6 F (36.4 C) (Axillary) Comment (Src): oral unavailable at this time;pt has cpap on  Resp (!) 31   Ht 1.575 m (5' 2)   Wt 64.2 kg   SpO2 (!) 88%   BMI 25.89 kg/m   Physical Exam Vitals and nursing note reviewed.  Constitutional:      Appearance: She is well-developed. She is not diaphoretic.  HENT:     Head: Normocephalic and atraumatic.     Right Ear: External ear normal.     Left Ear: External ear  normal.  Eyes:     General: No scleral icterus.       Right eye: No discharge.        Left eye: No discharge.     Conjunctiva/sclera: Conjunctivae normal.  Neck:     Trachea: No tracheal deviation.  Cardiovascular:     Rate and Rhythm: Normal rate and regular rhythm.  Pulmonary:     Effort: No respiratory distress.     Breath sounds: No stridor. Wheezing present. No rales.     Comments: Increased work of breathing Abdominal:     General: Bowel sounds are normal. There is no distension.     Palpations: Abdomen is soft.     Tenderness: There is no abdominal tenderness. There is no guarding or rebound.  Musculoskeletal:        General: No tenderness or  deformity.     Cervical back: Neck supple.     Right lower leg: No edema.     Left lower leg: No edema.  Skin:    General: Skin is warm and dry.     Findings: No rash.  Neurological:     General: No focal deficit present.     Mental Status: She is alert.     Cranial Nerves: No cranial nerve deficit, dysarthria or facial asymmetry.     Sensory: No sensory deficit.     Motor: No abnormal muscle tone or seizure activity.     Coordination: Coordination normal.  Psychiatric:        Mood and Affect: Mood normal.     (all labs ordered are listed, but only abnormal results are displayed) Labs Reviewed  CBC - Abnormal; Notable for the following components:      Result Value   WBC 14.1 (*)    RDW 15.9 (*)    All other components within normal limits  PRO BRAIN NATRIURETIC PEPTIDE - Abnormal; Notable for the following components:   Pro Brain Natriuretic Peptide 1,643.0 (*)    All other components within normal limits  COMPREHENSIVE METABOLIC PANEL WITH GFR - Abnormal; Notable for the following components:   Glucose, Bld 145 (*)    BUN 28 (*)    AST 13 (*)    GFR, Estimated 58 (*)    All other components within normal limits  RESP PANEL BY RT-PCR (RSV, FLU A&B, COVID)  RVPGX2  TROPONIN T, HIGH SENSITIVITY    EKG: EKG Interpretation Date/Time:  Friday February 18 2024 09:32:31 EST Ventricular Rate:  79 PR Interval:  199 QRS Duration:  120 QT Interval:  399 QTC Calculation: 458 R Axis:   37  Text Interpretation: Sinus rhythm Probable left atrial enlargement Nonspecific intraventricular conduction delay Probable anteroseptal infarct, old Abnormal T, consider ischemia, lateral leads No significant change since last tracing Confirmed by Randol Simmonds [phone]) on 02/18/2024 10:03:36 AM  Radiology: [facility] Chest Port 1 View Result Date: 02/18/2024 CLINICAL DATA:  Shortness of breath. EXAM: PORTABLE CHEST 1 VIEW COMPARISON:  July 07, 2023 FINDINGS: The cardiac silhouette is enlarged and  unchanged in size. Mild diffusely increased interstitial lung markings are noted. Mild to moderate severity atelectasis and/or infiltrate is seen within the bilateral lung bases. No pleural effusion or pneumothorax is identified. The visualized skeletal structures are unremarkable. IMPRESSION: 1. Cardiomegaly with mild interstitial edema. 2. Mild to moderate severity bibasilar atelectasis and/or infiltrate. Electronically Signed   By: Suzen Dials M.D.   On: 02/18/2024 13:32     Procedures   Medications Ordered in the  ED  albuterol  (PROVENTIL ) (2.5 MG/3ML) 0.083% nebulizer solution (  Not Given 02/18/24 0941)  albuterol  (PROVENTIL ) (2.5 MG/3ML) 0.083% nebulizer solution (10 mg/hr Nebulization Given 02/18/24 0956)    Clinical Course as of 02/18/24 1410  Fri Feb 18, 2024  1240 CBC(!) White blood cell count elevated, hemoglobin normal [JK]  1324 Comprehensive metabolic panel with GFR(!) Metabolic panel remarkable [JK]  1324 Pro Brain natriuretic peptide(!) Elevated [JK]  1324 Troponin T, High Sensitivity Troponin normal [JK]  1404 DG Chest Port 1 View Chest x-ray shows mild interstitial edema.  Patient was possible atelectasis and/or infiltrate [JK]  1409 Patient is feeling better after treatments.  She is not on BiPAP.  Patient however still is tachypneic and not back to her baseline [JK]    Clinical Course User Index [JK] Randol Simmonds, MD                                 Medical Decision Making Problems Addressed: COPD exacerbation [facility]): acute illness or injury that poses a threat to life or bodily functions  Amount and/or Complexity of Data Reviewed Labs: ordered. Decision-making details documented in ED Course. Radiology: ordered and independent interpretation performed. Decision-making details documented in ED Course.  Risk Prescription drug management. Decision regarding hospitalization.   Patient presented to the ED with complaints of shortness of breath.  Patient does  have history of COPD CHF.  On exam patient does not clinically appear volume overloaded.  She did have significant wheezing on her initial exam.  Chest x-ray shows mild interstitial edema questionable atelectasis or infiltrate.  I favor the latter.  proBNP is elevated but overall lower suspicion for CHF component.  Patient has had improvement with her breathing treatments however she is still is tachypneic and not quite back to baseline.  I think she would benefit from admission to hospital for further treatment.  Case discussed with Dr Lorelie. WIll add on cultures and start abx     Final diagnoses:  COPD exacerbation [facility])    ED Discharge Orders     None          Randol Simmonds, MD 02/18/24 1606  "

## 2024-02-18 NOTE — ED Triage Notes (Signed)
 Pt BIB EMS from home. Hx of COPD, CHF, HTN, afib. She called and reports sob since Tuesday, rescue inhaler has not working. Satting 80s w duoneb. Pt currently on CPAP by ems. Reports wheezing upper lobes and absent lower left lobes.    EMS gave 3 duonebs, cpap w PEEP of 5, 125 mg solumedrol, 0.5 gram of mag. 22 in left AC.  EMS vitals  BP 142/80 HR 48 (supposedly her baseline)  SPO2 99 on CPAP, mid 80s in on RA  CBG 250

## 2024-02-18 NOTE — H&P (Signed)
 " History and Physical    Patient: Kylie Torres DOB: [birth year] DOA: 02/18/2024 DOS: the patient was seen and examined on 02/18/2024 PCP: Haze Kingfisher, MD  Patient coming from: Home  Chief Complaint:  Chief Complaint  Patient presents with   Respiratory Distress   HPI: Kylie Torres is a 79 y.o. female with medical history significant for but not limited too history of essential hypertension, gastric ulcer, liver cirrhosis, peripheral arterial disease, history of COPD, chronic diastolic CHF and other comorbidities who presents with dyspnea for last few days.  She states that she became short of breath on Tuesday night and progressively worsened to the point where she could not breathe last night.  Given her concern she was brought to the ED.  She is complaining some chest discomfort and also complaining of cough which is nonproductive.  She states that she took her inhaler without improvement and now ran out.  Given her worsening dyspnea EMS was called and they placed her on CPAP and gave her Solu-Medrol .  She is brought to the ED for further evaluation and was weaned off of CPAP.  [facility] is asked to admit this patient for acute respiratory failure with hypoxia.   Review of Systems: As mentioned in the history of present illness. All other systems reviewed and are negative. Past Medical History:  Diagnosis Date   Acute pain of left shoulder 07/18/2019   Alcohol abuse    Blood transfusion without reported diagnosis    Cirrhosis (HCC)    Gastric ulcer    Heart murmur    Hypertension    Hypertensive retinopathy    OU   Peripheral arterial disease    Past Surgical History:  Procedure Laterality Date   ABDOMINAL HYSTERECTOMY     BREAST EXCISIONAL BIOPSY Left 1971   benign   CATARACT EXTRACTION Bilateral    Dr. Octavia   COLONOSCOPY  04/29/2021   COLONOSCOPY W/ POLYPECTOMY  03/23/2016   ESOPHAGOGASTRODUODENOSCOPY (EGD) WITH PROPOFOL  N/A 04/17/2015   Procedure:  ESOPHAGOGASTRODUODENOSCOPY (EGD) WITH PROPOFOL ;  Surgeon: Gwendlyn ONEIDA Buddy, MD;  Location: [facility];  Service: Endoscopy;  Laterality: N/A;   EYE SURGERY Bilateral    Cat Sx   OOPHORECTOMY     Social History:  reports that she quit smoking about 16 months ago. Her smoking use included cigarettes. She started smoking about 5 years ago. She has a 30.1 pack-year smoking history. She has never used smokeless tobacco. She reports that she does not drink alcohol and does not use drugs.  Allergies[1]  Family History  Problem Relation Age of Onset   Breast cancer Mother 74   Cancer Mother    Heart attack Father    Hypertension Father    Hypertension Sister    Diabetes Sister    Heart attack Brother    Stomach cancer Maternal Aunt    Breast cancer Maternal Aunt        ? age of onset   Colon cancer Neg Hx    Esophageal cancer Neg Hx    Rectal cancer Neg Hx    Prior to Admission medications  Medication Sig Start Date End Date Taking? Authorizing Provider  albuterol  (VENTOLIN  HFA) 108 (90 Base) MCG/ACT inhaler Inhale 2 puffs into the lungs every 6 (six) hours as needed for wheezing or shortness of breath. 12/11/22   Nooruddin, Saad, MD  CARTIA  XT 240 MG 24 hr capsule TAKE 1 CAPSULE BY MOUTH DAILY 11/13/22   Norrine Sharper, MD  carvedilol  (COREG )  12.5 MG tablet Take 1 tablet (12.5 mg total) by mouth 2 (two) times daily. 08/27/23 11/25/23  Vannie Reche RAMAN, NP  gabapentin  (NEURONTIN ) 100 MG capsule Take 3 capsules (300 mg total) by mouth at bedtime for 10 days. 07/07/23 11/25/23  Soto, Johana, PA-C  gabapentin  (NEURONTIN ) 300 MG capsule Take 300 mg by mouth daily. 09/09/23   [provider]  losartan  (COZAAR ) 100 MG tablet Take 100 mg by mouth daily. 06/15/23   [provider]  meloxicam (MOBIC) 7.5 MG tablet Take 7.5 mg by mouth daily. 08/13/23   [provider]  metFORMIN  (GLUCOPHAGE ) 500 MG tablet Take 1 tablet (500 mg total) by mouth 2 (two) times daily with a  meal. 09/15/23   Ula Prentice SAUNDERS, MD  olmesartan  (BENICAR ) 40 MG tablet Take 1 tablet (40 mg total) by mouth daily for 10 days. 07/07/23 11/25/23  Soto, Johana, PA-C  pantoprazole  (PROTONIX ) 40 MG tablet TAKE 1 TABLET BY MOUTH DAILY  BEFORE BREAKFAST 06/28/23   Kandis Perkins, DO  rosuvastatin  (CRESTOR ) 20 MG tablet TAKE 1 TABLET BY MOUTH DAILY 06/28/23   Kandis Perkins, DO  umeclidinium bromide  (INCRUSE ELLIPTA ) 62.5 MCG/ACT AEPB Inhale 1 puff into the lungs daily. 12/10/22   Nooruddin, Saad, MD  verapamil  (CALAN -SR) 240 MG CR tablet Take 1 tablet (240 mg total) by mouth at bedtime. 11/25/23   Tobb, Kardie, DO    Physical Exam: Vitals:   02/18/24 0941 02/18/24 0948 02/18/24 1426 02/18/24 1437  BP:    (!) 174/87  Pulse:    92  Resp:    (!) 23  Temp:   97.7 F (36.5 C)   TempSrc:      SpO2:  (!) 88%  94%  Weight: 64.2 kg     Height: 5' 2 (1.575 m)      Examination: Physical Exam:  Constitutional: WN/WD overweight elderly African-American female who appears a little dyspneic Respiratory: Diminished to auscultation bilaterally with some coarse breath sounds, mild rhonchi and crackles and some slight expiratory wheezing. Normal respiratory effort and patient is not tachypenic. No accessory muscle use.  Cardiovascular: RRR, no murmurs / rubs / gallops. S1 and S2 auscultated.  Minimal extremity edema Abdomen: Soft, non-tender, distended second body habitus.  Bowel sounds positive.  GU: Deferred. Musculoskeletal: No clubbing / cyanosis of digits/nails. No joint deformity upper and lower extremities.  Skin: No rashes, lesions, ulcers on limited skin evaluation. No induration; Warm and dry.  Neurologic: CN 2-12 grossly intact with no focal deficits.  Psychiatric: Normal judgment and insight. Alert and oriented x 3. Normal mood and appropriate affect.   Data Reviewed:  Recent Results (from the past 2160 hours)  CBC     Status: Abnormal   Collection Time: 02/18/24 10:06 AM  Result Value Ref Range    WBC 14.1 (H) 4.0 - 10.5 K/uL   RBC 4.34 3.87 - 5.11 MIL/uL   Hemoglobin 12.1 12.0 - 15.0 g/dL   HCT 60.7 63.9 - 53.9 %   MCV 90.3 80.0 - 100.0 fL   MCH 27.9 26.0 - 34.0 pg   MCHC 30.9 30.0 - 36.0 g/dL   RDW 84.0 (H) 88.4 - 84.4 %   Platelets 199 150 - 400 K/uL   nRBC 0.0 0.0 - 0.2 %    Comment: Performed at [facility], [address] [place], [place] [address]  Pro Brain natriuretic peptide     Status: Abnormal   Collection Time: 02/18/24 12:12 PM  Result Value Ref Range  Pro Brain Natriuretic Peptide 1,643.0 (H) <300.0 pg/mL    Comment: (NOTE) Age Group        Cut-Points    Interpretation  < 50 years     450 pg/mL       NT-proBNP > 450 pg/mL indicates                                ADHF is likely              50 to 75 years  900 pg/mL      NT-proBNP > 900 pg/mL indicates          ADHF is likely  > 75 years      1800 pg/mL     NT-proBNP > 1800 pg/mL indicates          ADHF is likely                           All ages    Results between       Indeterminate. Further clinical             300 and the cut-   information is needed to determine            point for age group   if ADHF is present.                                                             Elecsys proBNP II/ Elecsys proBNP II STAT           Cut-Point                       Interpretation  300 pg/mL                    NT-proBNP <300pg/mL indicates                             ADHF is not likely  Performed at [facility], [address], [place], [place] [address]   Troponin T, High Sensitivity     Status: None   Collection Time: 02/18/24 12:12 PM  Result Value Ref Range   Troponin T High Sensitivity 12 0 - 19 ng/L    Comment: (NOTE) Biotin concentrations > 1000 ng/mL falsely decrease TnT results.  Serial cardiac troponin measurements are suggested.  Refer to the Links section for chest pain algorithms and additional  guidance. Performed at [facility], [address], [place], [place] [address]   Comprehensive metabolic panel with GFR     Status: Abnormal   Collection Time: 02/18/24 12:13 PM  Result Value Ref Range   Sodium 141 135 - 145 mmol/L   Potassium 4.5 3.5 - 5.1 mmol/L    Comment: Delta check noted  Repeated to verify     Chloride 109 98 - 111 mmol/L   CO2 22 22 - 32 mmol/L   Glucose, Bld 145 (H) 70 - 99 mg/dL    Comment: Glucose reference range applies only to samples taken after fasting for at least 8 hours.   BUN  28 (H) 8 - 23 mg/dL   Creatinine, Ser 9.00 0.44 - 1.00 mg/dL   Calcium  10.2 8.9 - 10.3 mg/dL   Total Protein 7.7 6.5 - 8.1 g/dL   Albumin 4.5 3.5 - 5.0 g/dL   AST 13 (L) 15 - 41 U/L   ALT 7 0 - 44 U/L   Alkaline Phosphatase 91 38 - 126 U/L   Total Bilirubin 0.5 0.0 - 1.2 mg/dL   GFR, Estimated 58 (L) >60 mL/min    Comment: (NOTE) Calculated using the CKD-EPI Creatinine Equation (2021)    Anion gap 11 5 - 15    Comment: Performed at [facility], [address], [place], [place] [address]  Resp panel by RT-PCR (RSV, Flu A&B, Covid) Anterior Nasal Swab     Status: None   Collection Time: 02/18/24  1:24 PM   Specimen: Anterior Nasal Swab  Result Value Ref Range   SARS Coronavirus 2 by RT PCR NEGATIVE NEGATIVE    Comment: (NOTE) SARS-CoV-2 target nucleic acids are NOT DETECTED.  The SARS-CoV-2 RNA is generally detectable in upper respiratory specimens during the acute phase of infection. The lowest concentration of SARS-CoV-2 viral copies this assay can detect is 138 copies/mL. A negative result does not preclude SARS-Cov-2 infection and should not be used as the sole basis for treatment or other patient management decisions. A negative result may occur with  improper specimen collection/handling, submission of specimen other than nasopharyngeal swab, presence of viral mutation(s) within the areas targeted by this assay, and inadequate number of viral copies(<138  copies/mL). A negative result must be combined with clinical observations, patient history, and epidemiological information. The expected result is Negative.  Fact Sheet for Patients:  bloggercourse.com  Fact Sheet for Healthcare Providers:  seriousbroker.it  This test is no t yet approved or cleared by the [place]  FDA and  has been authorized for detection and/or diagnosis of SARS-CoV-2 by FDA under an Emergency Use Authorization (EUA). This EUA will remain  in effect (meaning this test can be used) for the duration of the COVID-19 declaration under Section 564(b)(1) of the Act, 21 U.S.C.section 360bbb-3(b)(1), unless the authorization is terminated  or revoked sooner.       Influenza A by PCR NEGATIVE NEGATIVE   Influenza B by PCR NEGATIVE NEGATIVE    Comment: (NOTE) The Xpert Xpress SARS-CoV-2/FLU/RSV plus assay is intended as an aid in the diagnosis of influenza from Nasopharyngeal swab specimens and should not be used as a sole basis for treatment. Nasal washings and aspirates are unacceptable for Xpert Xpress SARS-CoV-2/FLU/RSV testing.  Fact Sheet for Patients: bloggercourse.com  Fact Sheet for Healthcare Providers: seriousbroker.it  This test is not yet approved or cleared by the [place]  FDA and has been authorized for detection and/or diagnosis of SARS-CoV-2 by FDA under an Emergency Use Authorization (EUA). This EUA will remain in effect (meaning this test can be used) for the duration of the COVID-19 declaration under Section 564(b)(1) of the Act, 21 U.S.C. section 360bbb-3(b)(1), unless the authorization is terminated or revoked.     Resp Syncytial Virus by PCR NEGATIVE NEGATIVE    Comment: (NOTE) Fact Sheet for Patients: bloggercourse.com  Fact Sheet for Healthcare  Providers: seriousbroker.it  This test is not yet approved or cleared by the [place]  FDA and has been authorized for detection and/or diagnosis of SARS-CoV-2 by FDA under an Emergency Use Authorization (EUA). This EUA will remain in effect (meaning this test can be used) for the  duration of the COVID-19 declaration under Section 564(b)(1) of the Act, 21 U.S.C. section 360bbb-3(b)(1), unless the authorization is terminated or revoked.  Performed at [facility], [address], [place], [place] [address]   Blood culture (routine x 2)     Status: None (Preliminary result)   Collection Time: 02/18/24  3:32 PM   Specimen: BLOOD LEFT HAND  Result Value Ref Range   Specimen Description BLOOD LEFT HAND    Special Requests      BOTTLES DRAWN AEROBIC AND ANAEROBIC Blood Culture adequate volume Performed at [facility], [address] [place], [place] [address]    Culture PENDING    Report Status PENDING   Blood culture (routine x 2)     Status: None (Preliminary result)   Collection Time: 02/18/24  3:34 PM   Specimen: BLOOD LEFT ARM  Result Value Ref Range   Specimen Description BLOOD LEFT ARM    Special Requests      BOTTLES DRAWN AEROBIC ONLY Blood Culture adequate volume Performed at [facility], [address], [place], [place] [address]    Culture PENDING    Report Status PENDING   Procalcitonin     Status: None   Collection Time: 02/18/24  4:29 PM  Result Value Ref Range   Procalcitonin 0.14 ng/mL    Comment: (NOTE)   Sepsis PCT Algorithm          Lower Respiratory Tract Infection                                         PCT Algorithm -----------------------------------------------------------------  <0.5 ng/mL                    <0.10 ng/mL  Associated with low           Antibiotic therapy strongly   risk for progression          discouraged. Indicates absence   to severe sepsis              of bacteria  infection  and/or septic shock             --------------------------------------------------------------  0.5-2.0 ng/mL                 0.10-0.25 ng/mL  Recommended to retest         Antibiotic therapy discouraged.  PCT within 6-24 hours         Bacterial infection unlikely  ------------------------------------------------------------  >2 ng/mL                      0.26-0.50 ng/mL  Associated with high risk     Antibiotic therapy encouraged.  for progression to severe     Bacterial infection possible  sepsis/and or septic shock    ------------------------------                                 >0.50 ng/mL                                Antibiotic therapy strongly  encouraged.                                Suggestive of presence of                                 bacterial infection.                                 -------------------------------------------------------------------  < or = 0.50 ng/mL OR          < or = 0.25 OR 80% decrease in PCT  80% decrease in PCT           Antibiotic therapy   Antibiotic therapy may        may be discontinued  be discontinued                                 Performed at [facility], [address], [place], [place] [address]   Troponin T, High Sensitivity     Status: None   Collection Time: 02/18/24  4:29 PM  Result Value Ref Range   Troponin T High Sensitivity 12 0 - 19 ng/L    Comment: (NOTE) Biotin concentrations > 1000 ng/mL falsely decrease TnT results.  Serial cardiac troponin measurements are suggested.  Refer to the Links section for chest pain algorithms and additional  guidance. Performed at [facility], [address] [place], [place] [address]   Troponin T, High Sensitivity     Status: None   Collection Time: 02/18/24  6:07 PM  Result Value Ref Range   Troponin T High Sensitivity 12 0 - 19 ng/L    Comment: (NOTE) Biotin concentrations > 1000 ng/mL  falsely decrease TnT results.  Serial cardiac troponin measurements are suggested.  Refer to the Links section for chest pain algorithms and additional  guidance. Performed at [facility], [address] [place], [place] [address]    EKG: Showed a sinus rhythm with a rate of 79 and a left atrial enlargement and a QTc of 458   Assessment and Plan:  Acute Respiratory Failure w/ Hypoxia in the setting of Suspected COPD Exacerbation and Acute on Chronic Diastolic CHF and possible CAP - Admit to progressive unit.  Was using her rescue inhaler but had not been working and she ran out.  Presented and O2 saturations were in the 80s and had to be placed on CPAP and then supplemental oxygen nasal cannula.  Was placed on CPAP when she came in and was weaned off.  Continue supplemental oxygen via nasal cannula and wean as tolerated - Repeat chest x-ray in the AM.  Initiate steroids as she received IV Solu-Medrol  125 mg x 1 in the ED.  Restart steroids 60 mg every 12h.  Add DuoNebs Q6 scheduled and Brovana  and budesonide .  Flutter valve and set dysfunction guaifenesin 1201 p.o. twice daily - Given concern for mild volume overload on chest x-ray give a dose of IV Lasix  40 mg x 1 and reassess in the AM.  Repeat echocardiogram. -Continue with beta-blocker with carvedilol  12.5 mL p.o. twice daily, losartan  100 mg p.o. daily -BNP was elevated at 1643.0.  Troponins flat at 12 x 2 and -PCT was  0.14 -Strict I's and O's and daily weights -Check blood cultures x 2 -She had a leukocytosis on admission at 14.1. -Check 20 respiratory virus panel as RSV, SARS-CoV-2 and influenza A and B were negative -Initiated on IV ceftriaxone  and will continue and also add doxycycline . -Continue monitor respiratory status carefully and she will need an amatory O2 screen prior to discharge and repeat chest x-ray  HCM: Checking echocardiogram and the last echo showed no regional wall motion abnormalities LVEF  of 77 5% and severe LVH and grade 1 diastolic dysfunction  HLD: C/w Rosuvastatin  20 mg po Daily  History of SVT/VT: Continue diltiazem  and monitor on telemetry.  Continue with carvedilol  12.5 mg twice daily  PAD: Sees [facility] in outpatient setting.  No longer taking Pletal  and states her claudication has improved.  History of alcoholic liver cirrhosis: Does not appear decompensated; no longer drinks. follow-up with gastroenterology in outpatient setting  Diabetes Mellitus Type 2: Insulin  sensitive NovoLog  sliding scale insulin  AC and at bedtime and hold home regimen.  Last time she came to the hospital she presented to the ED with mild DKA and was stabilized in the ED and transition to metformin  and discharged home  GERD/GI Prophylaxis/history of gastric ulcer: C/w PPI w/ Pantoprazole  40 mg po at bedtime; avoid NSAIDs  Overweight: Complicates overall prognosis and care. Estimated body mass index is 26.49 kg/m as calculated from the following:   Height as of this encounter: 5' 2 (1.575 m).   Weight as of this encounter: 65.7 kg. Weight Loss and Dietary Counseling given  Advance Care Planning:   Code Status: Prior FULL   Consults: None  Family Communication: D/w Daughter @ bedside   Severity of Illness: The appropriate patient status for this patient is INPATIENT. Inpatient status is judged to be reasonable and necessary in order to provide the required intensity of service to ensure the patient's safety. The patient's presenting symptoms, physical exam findings, and initial radiographic and laboratory data in the context of their chronic comorbidities is felt to place them at high risk for further clinical deterioration. Furthermore, it is not anticipated that the patient will be medically stable for discharge from the hospital within 2 midnights of admission.   * I certify that at the point of admission it is my clinical judgment that the patient will require inpatient hospital care spanning  beyond 2 midnights from the point of admission due to high intensity of service, high risk for further deterioration and high frequency of surveillance required.*  Author: Alejandro Marker, DO [facility]  02/18/2024 2:40 PM  For on call review [email or web address].     [1] No Known Allergies  "

## 2024-02-18 NOTE — ED Notes (Addendum)
 Abx delay: pt current IV does not pull back blood- unable to obtain cultures at this time

## 2024-02-19 ENCOUNTER — Inpatient Hospital Stay (HOSPITAL_COMMUNITY)

## 2024-02-19 ENCOUNTER — Other Ambulatory Visit (HOSPITAL_COMMUNITY): Payer: Self-pay

## 2024-02-19 DIAGNOSIS — R0609 Other forms of dyspnea: Secondary | ICD-10-CM | POA: Diagnosis not present

## 2024-02-19 LAB — CBC WITH DIFFERENTIAL/PLATELET
Abs Immature Granulocytes: 0.1 10*3/uL — ABNORMAL HIGH (ref 0.00–0.07)
Basophils Absolute: 0 10*3/uL (ref 0.0–0.1)
Basophils Relative: 0 %
Eosinophils Absolute: 0 10*3/uL (ref 0.0–0.5)
Eosinophils Relative: 0 %
HCT: 37.6 % (ref 36.0–46.0)
Hemoglobin: 12.7 g/dL (ref 12.0–15.0)
Immature Granulocytes: 1 %
Lymphocytes Relative: 10 %
Lymphs Abs: 1.5 10*3/uL (ref 0.7–4.0)
MCH: 28.6 pg (ref 26.0–34.0)
MCHC: 33.8 g/dL (ref 30.0–36.0)
MCV: 84.7 fL (ref 80.0–100.0)
Monocytes Absolute: 0.3 10*3/uL (ref 0.1–1.0)
Monocytes Relative: 2 %
Neutro Abs: 13.1 10*3/uL — ABNORMAL HIGH (ref 1.7–7.7)
Neutrophils Relative %: 87 %
Platelets: 222 10*3/uL (ref 150–400)
RBC: 4.44 MIL/uL (ref 3.87–5.11)
RDW: 15.7 % — ABNORMAL HIGH (ref 11.5–15.5)
WBC: 15 10*3/uL — ABNORMAL HIGH (ref 4.0–10.5)
nRBC: 0 % (ref 0.0–0.2)

## 2024-02-19 LAB — COMPREHENSIVE METABOLIC PANEL WITH GFR
ALT: 7 U/L (ref 0–44)
AST: 20 U/L (ref 15–41)
Albumin: 4.5 g/dL (ref 3.5–5.0)
Alkaline Phosphatase: 116 U/L (ref 38–126)
Anion gap: 15 (ref 5–15)
BUN: 25 mg/dL — ABNORMAL HIGH (ref 8–23)
CO2: 21 mmol/L — ABNORMAL LOW (ref 22–32)
Calcium: 10.5 mg/dL — ABNORMAL HIGH (ref 8.9–10.3)
Chloride: 103 mmol/L (ref 98–111)
Creatinine, Ser: 1 mg/dL (ref 0.44–1.00)
GFR, Estimated: 58 mL/min — ABNORMAL LOW
Glucose, Bld: 298 mg/dL — ABNORMAL HIGH (ref 70–99)
Potassium: 4.1 mmol/L (ref 3.5–5.1)
Sodium: 139 mmol/L (ref 135–145)
Total Bilirubin: 0.6 mg/dL (ref 0.0–1.2)
Total Protein: 7.9 g/dL (ref 6.5–8.1)

## 2024-02-19 LAB — ECHOCARDIOGRAM COMPLETE
AR max vel: 2.8 cm2
AV Area VTI: 2.55 cm2
AV Area mean vel: 2.3 cm2
AV Mean grad: 12.5 mmHg
AV Peak grad: 20.3 mmHg
Ao pk vel: 2.25 m/s
Area-P 1/2: 2.12 cm2
Calc EF: 71.6 %
Height: 62 in
MV VTI: 3.05 cm2
S' Lateral: 1.9 cm
Single Plane A2C EF: 69.9 %
Single Plane A4C EF: 72.9 %
Weight: 2324.53 [oz_av]

## 2024-02-19 LAB — PHOSPHORUS: Phosphorus: 3.3 mg/dL (ref 2.5–4.6)

## 2024-02-19 LAB — LEGIONELLA PNEUMOPHILA SEROGP 1 UR AG: L. pneumophila Serogp 1 Ur Ag: NEGATIVE

## 2024-02-19 LAB — GLUCOSE, CAPILLARY
Glucose-Capillary: 234 mg/dL — ABNORMAL HIGH (ref 70–99)
Glucose-Capillary: 239 mg/dL — ABNORMAL HIGH (ref 70–99)

## 2024-02-19 LAB — TSH: TSH: 0.293 u[IU]/mL — ABNORMAL LOW (ref 0.350–4.500)

## 2024-02-19 LAB — HEMOGLOBIN A1C
Hgb A1c MFr Bld: 6.4 % — ABNORMAL HIGH (ref 4.8–5.6)
Mean Plasma Glucose: 136.98 mg/dL

## 2024-02-19 LAB — MAGNESIUM: Magnesium: 2.3 mg/dL (ref 1.7–2.4)

## 2024-02-19 MED ORDER — PREDNISONE 10 MG (21) PO TBPK
ORAL_TABLET | ORAL | 0 refills | Status: AC
  Filled 2024-02-19: qty 21, 6d supply, fill #0

## 2024-02-19 MED ORDER — ONDANSETRON HCL 4 MG PO TABS
4.0000 mg | ORAL_TABLET | Freq: Four times a day (QID) | ORAL | 0 refills | Status: AC | PRN
  Filled 2024-02-19: qty 20, 5d supply, fill #0

## 2024-02-19 MED ORDER — ALBUTEROL SULFATE (2.5 MG/3ML) 0.083% IN NEBU: 2.5000 mg | INHALATION_SOLUTION | RESPIRATORY_TRACT | Status: DC | PRN

## 2024-02-19 MED ORDER — ACETAMINOPHEN 325 MG PO TABS
650.0000 mg | ORAL_TABLET | Freq: Four times a day (QID) | ORAL | 0 refills | Status: AC | PRN
  Filled 2024-02-19: qty 20, 3d supply, fill #0

## 2024-02-19 MED ORDER — ALBUTEROL SULFATE HFA 108 (90 BASE) MCG/ACT IN AERS
2.0000 | INHALATION_SPRAY | Freq: Four times a day (QID) | RESPIRATORY_TRACT | 2 refills | Status: AC | PRN
  Filled 2024-02-19: qty 6.7, 25d supply, fill #0

## 2024-02-19 MED ORDER — IPRATROPIUM-ALBUTEROL 0.5-2.5 (3) MG/3ML IN SOLN: 3.0000 mL | Freq: Two times a day (BID) | RESPIRATORY_TRACT | Status: DC

## 2024-02-19 MED ORDER — HYDRALAZINE HCL 20 MG/ML IJ SOLN
5.0000 mg | Freq: Four times a day (QID) | INTRAMUSCULAR | Status: DC | PRN
  Administered 2024-02-19: 5 mg via INTRAVENOUS
  Filled 2024-02-19: qty 1

## 2024-02-19 MED ORDER — POLYETHYLENE GLYCOL 3350 17 GM/SCOOP PO POWD
17.0000 g | Freq: Every day | ORAL | 0 refills | Status: AC | PRN
  Filled 2024-02-19: qty 238, 14d supply, fill #0

## 2024-02-19 MED ORDER — DOXYCYCLINE HYCLATE 100 MG PO TABS
100.0000 mg | ORAL_TABLET | Freq: Two times a day (BID) | ORAL | 0 refills | Status: AC
  Filled 2024-02-19: qty 10, 5d supply, fill #0

## 2024-02-19 NOTE — Care Management CC44 (Signed)
"         Condition Code 44 Documentation Completed  Patient Details  Name: Kylie Torres MRN: [id number] Date of Birth: Feb 21, 1945   Condition Code 44 given:  Yes Patient signature on Condition Code 44 notice:  Yes Documentation of 2 MD's agreement:  Yes Code 44 added to claim:  Yes    Sonda Manuella Quill, RN 02/19/2024, 1:29 PM  "

## 2024-02-19 NOTE — Care Management Obs Status (Signed)
 MEDICARE OBSERVATION STATUS NOTIFICATION   Patient Details  Name: TAYTUM SCHECK MRN: [id number] Date of Birth: [birth year]   Medicare Observation Status Notification Given:  Yes    Sonda Manuella Quill, RN 02/19/2024, 1:29 PM

## 2024-02-19 NOTE — Discharge Summary (Signed)
 " Physician Discharge Summary   Patient: Kylie Torres MRN: [id number] DOB: 1945-07-25  Admit date:     02/18/2024  Discharge date: {dischdate:26783}  Discharge Physician: Alejandro Lazarus Marker   PCP: Haze Kingfisher, MD   Recommendations at discharge:  {Tip this will not be part of the note when signed- Example include specific recommendations for outpatient follow-up, pending tests to follow-up on. (Optional):26781}  ***  Discharge Diagnoses: Principal Problem:   SOB (shortness of breath)  Resolved Problems:   * No resolved hospital problems. [facility] Course: No notes on file  Assessment and Plan: No notes have been filed under this hospital service. Service: Hospitalist     {Tip this will not be part of the note when signed Body mass index is 26.57 kg/m. , ,  (Optional):26781}  {(NOTE) Pain control PDMP Statment (Optional):26782} Consultants: *** Procedures performed: ***  Disposition: {Plan; Disposition:26390} Diet recommendation:  Discharge Diet Orders (From admission, onward)     Start     Ordered   02/19/24 0000  Diet - low sodium heart healthy        02/19/24 1338           {Diet_Plan:26776} DISCHARGE MEDICATION: Allergies as of 02/19/2024   No Known Allergies      Medication List     STOP taking these medications    meloxicam 7.5 MG tablet Commonly known as: MOBIC   verapamil  240 MG CR tablet Commonly known as: CALAN -SR       TAKE these medications    acetaminophen  325 MG tablet Commonly known as: TYLENOL  Take 2 tablets (650 mg total) by mouth every 6 (six) hours as needed for mild pain (pain score 1-3) or fever (or Fever >/= 101).   albuterol  108 (90 Base) MCG/ACT inhaler Commonly known as: VENTOLIN  HFA Inhale 2 puffs into the lungs every 6 (six) hours as needed for wheezing or shortness of breath. What changed: when to take this   carvedilol  12.5 MG tablet Commonly known as: COREG  Take 1 tablet (12.5 mg total) by mouth 2  (two) times daily. What changed: when to take this   diltiazem  240 MG 24 hr capsule Commonly known as: CARDIZEM  CD Take 240 mg by mouth in the morning. What changed: Another medication with the same name was removed. Continue taking this medication, and follow the directions you see here.   doxycycline  100 MG tablet Commonly known as: VIBRA -TABS Take 1 tablet (100 mg total) by mouth every 12 (twelve) hours for 5 days.   gabapentin  300 MG capsule Commonly known as: NEURONTIN  Take 300 mg by mouth at bedtime. What changed: Another medication with the same name was removed. Continue taking this medication, and follow the directions you see here.   Incruse Ellipta  62.5 MCG/ACT Aepb Generic drug: umeclidinium bromide  Inhale 1 puff into the lungs daily.   metFORMIN  1000 MG tablet Commonly known as: GLUCOPHAGE  Take 1,000 mg by mouth in the morning and at bedtime. What changed: Another medication with the same name was removed. Continue taking this medication, and follow the directions you see here.   olmesartan  40 MG tablet Commonly known as: BENICAR  Take 40 mg by mouth in the morning. What changed: Another medication with the same name was removed. Continue taking this medication, and follow the directions you see here.   ondansetron  4 MG tablet Commonly known as: ZOFRAN  Take 1 tablet (4 mg total) by mouth every 6 (six) hours as needed for nausea.   pantoprazole  40 MG  tablet Commonly known as: PROTONIX  TAKE 1 TABLET BY MOUTH DAILY  BEFORE BREAKFAST   polyethylene glycol 17 g packet Commonly known as: MIRALAX  / GLYCOLAX  Take 17 g by mouth daily as needed for mild constipation.   predniSONE  10 MG (21) Tbpk tablet Commonly known as: STERAPRED UNI-PAK 21 TAB Take 6 pills on day 1, 5 pills on day 2, 4 pills on day 3, 3 pills on day 4, 2 pills on day 5, 1 pill on day 6 and then stop on day 7   rosuvastatin  20 MG tablet Commonly known as: CRESTOR  TAKE 1 TABLET BY MOUTH DAILY What  changed: when to take this        Discharge Exam: Filed Weights   02/18/24 0941 02/18/24 1519 02/19/24 0500  Weight: 64.2 kg 65.7 kg 65.9 kg   ***  Condition at discharge: {DC Condition:26389}  The results of significant diagnostics from this hospitalization (including imaging, microbiology, ancillary and laboratory) are listed below for reference.   Imaging Studies: ECHOCARDIOGRAM COMPLETE Result Date: 02/19/2024    ECHOCARDIOGRAM REPORT   Patient Name:   Kylie Torres Date of Exam: 02/19/2024 Medical Rec #:  [id number]    Height:       62.0 in Accession #:    [id number]   Weight:       145.3 lb Date of Birth:  02-Jan-1946    BSA:          1.669 m Patient Age:    79 years     BP:           174/87 mmHg Patient Gender: F            HR:           80 bpm. Exam Location:  Inpatient Procedure: 2D Echo, Cardiac Doppler and Color Doppler (Both Spectral and Color            Flow Doppler were utilized during procedure). Indications:    Dyspnea R06.00  History:        Patient has prior history of Echocardiogram examinations, most                 recent 02/19/2023. Signs/Symptoms:Murmur; Risk                 Factors:Hypertension.  Sonographer:    Sydnee Wilson RDCS Referring Phys: [id number] Leighla Chestnutt LATIF [facility] IMPRESSIONS  1. There is severe LVH and evidence of HCM with PG 40 mmHg at rest that increased to 142 mmHg with valsalva. Left ventricular ejection fraction, by estimation, is 70 to 75%. Left ventricular ejection fraction by 2D MOD biplane is 71.6 %. The left ventricle has hyperdynamic function. The left ventricle has no regional wall motion abnormalities. There is severe concentric left ventricular hypertrophy. Elevated left atrial pressure.  2. Right ventricular systolic function is normal. The right ventricular size is normal. There is mildly elevated pulmonary artery systolic pressure. The estimated right ventricular systolic pressure is 42.7 mmHg.  3. Left atrial size was mild to moderately dilated.  4.  Moderate pericardial effusion.  5. The mitral valve is normal in structure. No evidence of mitral valve regurgitation. No evidence of mitral stenosis. Moderate mitral annular calcification.  6. High velocity across AV valve partially due to high flow state. The aortic valve is normal in structure. Aortic valve regurgitation is not visualized. Aortic valve sclerosis/calcification is present, without any evidence of aortic stenosis.  7. The inferior vena cava is normal in size with greater than  50% respiratory variability, suggesting right atrial pressure of 3 mmHg. Comparison(s): Prior images reviewed side by side. Changes from prior study are noted. EF and pericardial effusion stable from prior. There is evidence of obstruction with higher gradient at rest and with valsalva. FINDINGS  Left Ventricle: There is severe LVH and evidence of HCM with PG 40 mmHg at rest that increased to 142 mmHg with valsalva. Left ventricular ejection fraction, by estimation, is 70 to 75%. Left ventricular ejection fraction by 2D MOD biplane is 71.6 %. The left ventricle has hyperdynamic function. The left ventricle has no regional wall motion abnormalities. The left ventricular internal cavity size was normal in size. There is severe concentric left ventricular hypertrophy. Left ventricular diastolic function could not be evaluated due to mitral annular calcification (moderate or greater). Elevated left atrial pressure. Right Ventricle: The right ventricular size is normal. No increase in right ventricular wall thickness. Right ventricular systolic function is normal. There is mildly elevated pulmonary artery systolic pressure. The tricuspid regurgitant velocity is 3.15  m/s, and with an assumed right atrial pressure of 3 mmHg, the estimated right ventricular systolic pressure is 42.7 mmHg. Left Atrium: Left atrial size was mild to moderately dilated. Right Atrium: Right atrial size was normal in size. Pericardium: A moderately sized  pericardial effusion is present. Mitral Valve: The mitral valve is normal in structure. Moderate mitral annular calcification. No evidence of mitral valve regurgitation. No evidence of mitral valve stenosis. MV peak gradient, 11.3 mmHg. The mean mitral valve gradient is 4.0 mmHg. Tricuspid Valve: The tricuspid valve is normal in structure. Tricuspid valve regurgitation is not demonstrated. No evidence of tricuspid stenosis. Aortic Valve: High velocity across AV valve partially due to high flow state. The aortic valve is normal in structure. Aortic valve regurgitation is not visualized. Aortic valve sclerosis/calcification is present, without any evidence of aortic stenosis.  Aortic valve mean gradient measures 12.5 mmHg. Aortic valve peak gradient measures 20.2 mmHg. Aortic valve area, by VTI measures 2.55 cm. Pulmonic Valve: The pulmonic valve was not well visualized. Pulmonic valve regurgitation is not visualized. No evidence of pulmonic stenosis. Aorta: The aortic root is normal in size and structure. Venous: The inferior vena cava is normal in size with greater than 50% respiratory variability, suggesting right atrial pressure of 3 mmHg. IAS/Shunts: No atrial level shunt detected by color flow Doppler.  LEFT VENTRICLE PLAX 2D                        Biplane EF (MOD) LVIDd:         2.90 cm         LV Biplane EF:   Left LVIDs:         1.90 cm                          ventricular LV PW:         1.40 cm                          ejection LV IVS:        1.70 cm                          fraction by LVOT diam:     2.00 cm  2D MOD LV SV:         119                              biplane is LV SV Index:   71                               71.6 %. LVOT Area:     3.14 cm                                Diastology                                LV e' medial:    3.59 cm/s LV Volumes (MOD)               LV E/e' medial:  33.1 LV vol d, MOD    82.7 ml       LV e' lateral:   5.00 cm/s A2C:                            LV E/e' lateral: 23.8 LV vol d, MOD    89.2 ml A4C: LV vol s, MOD    24.9 ml A2C: LV vol s, MOD    24.2 ml A4C: LV SV MOD A2C:   57.8 ml LV SV MOD A4C:   89.2 ml LV SV MOD BP:    62.4 ml RIGHT VENTRICLE RV S prime:     13.70 cm/s TAPSE (M-mode): 2.4 cm LEFT ATRIUM             Index        RIGHT ATRIUM           Index LA diam:        3.00 cm 1.80 cm/m   RA Area:     12.30 cm LA Vol (A2C):   49.0 ml 29.36 ml/m  RA Volume:   24.60 ml  14.74 ml/m LA Vol (A4C):   76.7 ml 45.96 ml/m LA Biplane Vol: 67.4 ml 40.39 ml/m  AORTIC VALVE AV Area (Vmax):    2.80 cm AV Area (Vmean):   2.30 cm AV Area (VTI):     2.55 cm AV Vmax:           225.00 cm/s AV Vmean:          169.000 cm/s AV VTI:            0.466 m AV Peak Grad:      20.2 mmHg AV Mean Grad:      12.5 mmHg LVOT Vmax:         200.50 cm/s LVOT Vmean:        123.750 cm/s LVOT VTI:          0.378 m LVOT/AV VTI ratio: 0.81  AORTA Ao Root diam: 2.60 cm Ao Asc diam:  3.00 cm MITRAL VALVE                TRICUSPID VALVE MV Area (PHT): 2.12 cm     TR Peak grad:   39.7 mmHg MV Area VTI:   3.05 cm     TR Vmax:        315.00 cm/s MV  Peak grad:  11.3 mmHg MV Mean grad:  4.0 mmHg     SHUNTS MV Vmax:       1.68 m/s     Systemic VTI:  0.38 m MV Vmean:      87.3 cm/s    Systemic Diam: 2.00 cm MV E velocity: 119.00 cm/s MV A velocity: 162.00 cm/s MV E/A ratio:  0.73 Franck Azobou Tonleu Electronically signed by Joelle Ren Ny Signature Date/Time: 02/19/2024/12:42:15 PM    Final    DG Chest Port 1 View Result Date: 02/18/2024 CLINICAL DATA:  Shortness of breath. EXAM: PORTABLE CHEST 1 VIEW COMPARISON:  July 07, 2023 FINDINGS: The cardiac silhouette is enlarged and unchanged in size. Mild diffusely increased interstitial lung markings are noted. Mild to moderate severity atelectasis and/or infiltrate is seen within the bilateral lung bases. No pleural effusion or pneumothorax is identified. The visualized skeletal structures are unremarkable. IMPRESSION: 1.  Cardiomegaly with mild interstitial edema. 2. Mild to moderate severity bibasilar atelectasis and/or infiltrate. Electronically Signed   By: Suzen Dials M.D.   On: 02/18/2024 13:32    Microbiology: Results for orders placed or performed during the hospital encounter of 02/18/24  Resp panel by RT-PCR (RSV, Flu A&B, Covid) Anterior Nasal Swab     Status: None   Collection Time: 02/18/24  1:24 PM   Specimen: Anterior Nasal Swab  Result Value Ref Range Status   SARS Coronavirus 2 by RT PCR NEGATIVE NEGATIVE Final    Comment: (NOTE) SARS-CoV-2 target nucleic acids are NOT DETECTED.  The SARS-CoV-2 RNA is generally detectable in upper respiratory specimens during the acute phase of infection. The lowest concentration of SARS-CoV-2 viral copies this assay can detect is 138 copies/mL. A negative result does not preclude SARS-Cov-2 infection and should not be used as the sole basis for treatment or other patient management decisions. A negative result may occur with  improper specimen collection/handling, submission of specimen other than nasopharyngeal swab, presence of viral mutation(s) within the areas targeted by this assay, and inadequate number of viral copies(<138 copies/mL). A negative result must be combined with clinical observations, patient history, and epidemiological information. The expected result is Negative.  Fact Sheet for Patients:  bloggercourse.com  Fact Sheet for Healthcare Providers:  seriousbroker.it  This test is no t yet approved or cleared by the [place]  FDA and  has been authorized for detection and/or diagnosis of SARS-CoV-2 by FDA under an Emergency Use Authorization (EUA). This EUA will remain  in effect (meaning this test can be used) for the duration of the COVID-19 declaration under Section 564(b)(1) of the Act, 21 U.S.C.section 360bbb-3(b)(1), unless the authorization is terminated  or  revoked sooner.       Influenza A by PCR NEGATIVE NEGATIVE Final   Influenza B by PCR NEGATIVE NEGATIVE Final    Comment: (NOTE) The Xpert Xpress SARS-CoV-2/FLU/RSV plus assay is intended as an aid in the diagnosis of influenza from Nasopharyngeal swab specimens and should not be used as a sole basis for treatment. Nasal washings and aspirates are unacceptable for Xpert Xpress SARS-CoV-2/FLU/RSV testing.  Fact Sheet for Patients: bloggercourse.com  Fact Sheet for Healthcare Providers: seriousbroker.it  This test is not yet approved or cleared by the [place]  FDA and has been authorized for detection and/or diagnosis of SARS-CoV-2 by FDA under an Emergency Use Authorization (EUA). This EUA will remain in effect (meaning this test can be used) for the duration of the COVID-19 declaration under Section 564(b)(1) of the Act, 21  U.S.C. section 360bbb-3(b)(1), unless the authorization is terminated or revoked.     Resp Syncytial Virus by PCR NEGATIVE NEGATIVE Final    Comment: (NOTE) Fact Sheet for Patients: bloggercourse.com  Fact Sheet for Healthcare Providers: seriousbroker.it  This test is not yet approved or cleared by the [place]  FDA and has been authorized for detection and/or diagnosis of SARS-CoV-2 by FDA under an Emergency Use Authorization (EUA). This EUA will remain in effect (meaning this test can be used) for the duration of the COVID-19 declaration under Section 564(b)(1) of the Act, 21 U.S.C. section 360bbb-3(b)(1), unless the authorization is terminated or revoked.  Performed at [facility], [address] [place], [place] [address]   Blood culture (routine x 2)     Status: None (Preliminary result)   Collection Time: 02/18/24  3:32 PM   Specimen: BLOOD LEFT HAND  Result Value Ref Range Status   Specimen Description BLOOD LEFT  HAND  Final   Special Requests   Final    BOTTLES DRAWN AEROBIC AND ANAEROBIC Blood Culture adequate volume   Culture   Final    NO GROWTH < 24 HOURS Performed at [facility], [address] [place], [place] [address]    Report Status PENDING  Incomplete  Blood culture (routine x 2)     Status: None (Preliminary result)   Collection Time: 02/18/24  3:34 PM   Specimen: BLOOD LEFT ARM  Result Value Ref Range Status   Specimen Description BLOOD LEFT ARM  Final   Special Requests   Final    BOTTLES DRAWN AEROBIC ONLY Blood Culture adequate volume   Culture   Final    NO GROWTH < 24 HOURS Performed at [facility], [address] [place], [place] [address]    Report Status PENDING  Incomplete    Labs: CBC: Recent Labs  Lab 02/18/24 1006 02/19/24 0937  WBC 14.1* 15.0*  NEUTROABS  --  13.1*  HGB 12.1 12.7  HCT 39.2 37.6  MCV 90.3 84.7  PLT 199 222   Basic Metabolic Panel: Recent Labs  Lab 02/18/24 1213 02/19/24 0937  NA 141 139  K 4.5 4.1  CL 109 103  CO2 22 21*  GLUCOSE 145* 298*  BUN 28* 25*  CREATININE 0.99 1.00  CALCIUM  10.2 10.5*  MG  --  2.3  PHOS  --  3.3   Liver Function Tests: Recent Labs  Lab 02/18/24 1213 02/19/24 0937  AST 13* 20  ALT 7 7  ALKPHOS 91 116  BILITOT 0.5 0.6  PROT 7.7 7.9  ALBUMIN 4.5 4.5   CBG: Recent Labs  Lab 02/18/24 2147 02/19/24 0740 02/19/24 1208  GLUCAP 342* 234* 239*    Discharge time spent: {LESS THAN/GREATER THAN:26388} 30 minutes.  Signed: Alejandro Lazarus Marker, DO [facility] 02/19/2024 "

## 2024-02-19 NOTE — Progress Notes (Signed)
 Acknowledge consult for Heart Failure Home Health Screen; orders previously placed for Heart Failure Navigation Team; clearing consult.

## 2024-02-19 NOTE — TOC Initial Note (Signed)
 Transition of Care [facility]) - Initial/Assessment Note    Patient Details  Name: Kylie Torres MRN: [id number] Date of Birth: 21-Jun-1945  [facility] CM/SW Contact:    Sonda Manuella Quill, RN Phone Number: 02/19/2024, 2:51 PM  Clinical Narrative:                 [place] w/ pt and dtr Valetta Daring [phone]) in room; pt said she lives at home; she plans to return at d/c w/ family support; her dtr will provide transportation; insurance/PCP verified; pt denied SDOH risks; she does not have DME, HH services or home oxygen; no IP CM needs.  Expected Discharge Plan: Home/Self Care Barriers to Discharge: No Barriers Identified   Patient Goals and CMS Choice Patient states their goals for this hospitalization and ongoing recovery are:: home     [place] ownership interest in [facility].provided to:: Patient    Expected Discharge Plan and Services   Discharge Planning Services: CM Consult   Living arrangements for the past 2 months: Apartment Expected Discharge Date: 02/19/24               DME Arranged: N/A DME Agency: NA       HH Arranged: NA HH Agency: NA        Prior Living Arrangements/Services Living arrangements for the past 2 months: Apartment Lives with:: Self Patient language and need for interpreter reviewed:: Yes Do you feel safe going back to the place where you live?: Yes      Need for Family Participation in Patient Care: Yes (Comment) Care giver support system in place?: Yes (comment) Current home services:  (n/a) Criminal Activity/Legal Involvement Pertinent to Current Situation/Hospitalization: No - Comment as needed  Activities of Daily Living   ADL Screening (condition at time of admission) Independently performs ADLs?: Yes (appropriate for developmental age) Is the patient deaf or have difficulty hearing?: Yes Does the patient have difficulty seeing, even when wearing glasses/contacts?: Yes Does the patient have  difficulty concentrating, remembering, or making decisions?: No  Permission Sought/Granted Permission sought to share information with : Case Manager Permission granted to share information with : Yes, Verbal Permission Granted  Share Information with NAME: Case Manager     Permission granted to share info w Relationship: Valetta Daring (dtr) [phone]     Emotional Assessment Appearance:: Appears stated age Attitude/Demeanor/Rapport: Gracious Affect (typically observed): Accepting Orientation: : Oriented to Self, Oriented to Place, Oriented to  Time, Oriented to Situation Alcohol / Substance Use: Not Applicable Psych Involvement: No (comment)  Admission diagnosis:  SOB (shortness of breath) [R06.02] COPD exacerbation (HCC) [J44.1] Patient Active Problem List   Diagnosis Date Noted   SOB (shortness of breath) 02/18/2024   Emphysema of lung (HCC) 12/11/2022   Lung nodule 05/01/2022   Heart murmur 10/10/2020   Abdominal pain 10/10/2020   Nonadherence to medication 10/10/2020   Hyperlipidemia 12/20/2019   Vitamin D  deficiency 12/20/2019   Osteoporosis 12/13/2019   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 [place] [address] Phone: [phone] Fax: [phone]  [facility]  [address], [place] - [address] [place] [address] Phone: [phone] Fax: [phone]     Social Drivers of Health (SDOH) Social History: SDOH Screenings   Food Insecurity: No Food  Insecurity (02/19/2024)  Housing: Low Risk (02/19/2024)  Recent Concern: Housing - High Risk (02/18/2024)  Transportation Needs: No Transportation Needs (02/19/2024)  Utilities: Not At Risk (02/19/2024)  Alcohol Screen: Low Risk (09/23/2022)  Depression (PHQ2-9): Low Risk (09/23/2022)  Financial Resource Strain: Low Risk (09/23/2022)  Physical Activity: Inactive (09/23/2022)  Social Connections: Socially Isolated (02/18/2024)  Stress: No Stress Concern Present (09/23/2022)  Tobacco Use: Medium Risk (02/18/2024)  Health Literacy: Adequate Health Literacy (09/23/2022)   SDOH Interventions: Food Insecurity Interventions: Intervention Not Indicated, Inpatient TOC Housing Interventions: Intervention Not Indicated, Inpatient TOC Transportation Interventions: Intervention Not Indicated, Inpatient TOC Utilities Interventions: Intervention Not Indicated, Inpatient TOC   Readmission Risk Interventions    02/19/2024    2:49 PM  Readmission Risk Prevention Plan  Transportation Screening Complete  PCP or Specialist Appt within 5-7 Days Complete  Home Care Screening Complete  Medication Review (RN CM) Complete

## 2024-02-21 NOTE — Hospital Course (Addendum)
 Kylie Torres is a 79 y.o. female with medical history significant for but not limited too history of essential hypertension, gastric ulcer, liver cirrhosis, peripheral arterial disease, history of COPD, chronic diastolic CHF and other comorbidities who presents with dyspnea for last few days.  She states that she became short of breath on Tuesday night and progressively worsened to the point where she could not breathe last night.  Given her concern she was brought to the ED.  She is complaining some chest discomfort and also complaining of cough which is nonproductive.  She states that she took her inhaler without improvement and now ran out.  Given her worsening dyspnea EMS was called and they placed her on CPAP and gave her Solu-Medrol .  She is brought to the ED for further evaluation and was weaned off of CPAP.  [facility] is asked to admit this patient for acute respiratory failure with hypoxia.   The patient was diuresed and given steroids and improved quicker than expected.  She ambulated 400 feet without issues and did not desaturate.  Chest x-ray showed improvement.  Echocardiogram was done and showed changes of HCM and she will follow-up with her cardiologist in outpatient setting within 1 to 2 weeks.  She is medically stable for discharge at this time as she is improved back to her baseline.  Assessment and Plan:  Acute Respiratory Failure w/ Hypoxia in the setting of Suspected COPD Exacerbation and Acute on Chronic Diastolic CHF and possible CAP - Admit to progressive unit.  Was using her rescue inhaler but had not been working and she ran out.  Presented and O2 saturations were in the 80s and had to be placed on CPAP and then supplemental oxygen nasal cannula.  Was placed on CPAP when she came in and was weaned off.  Continue supplemental oxygen via nasal cannula and wean as tolerated - Repeat chest x-ray in the AM.  Initiate steroids as she received IV Solu-Medrol  125 mg x 1 in the ED.  Restart  steroids 60 mg every 12h.  Add DuoNebs Q6 scheduled and Brovana  and budesonide .  Flutter valve and set dysfunction guaifenesin 1201 p.o. twice daily - Given concern for mild volume overload on chest x-ray give a dose of IV Lasix  40 mg x 1 and reassess in the AM.  Repeat echocardiogram. -Continue with beta-blocker with carvedilol  12.5 mL p.o. twice daily, losartan  100 mg p.o. daily -BNP was elevated at 1643.0.  Troponins flat at 12 x 2 and -PCT was 0.14 -Strict I's and O's and daily weights -Check blood cultures x 2 -She had a leukocytosis on admission at 14.1 and is stable and slightly elevated in the setting of steroids at 15.0. -Check 20 respiratory virus panel as RSV, SARS-CoV-2 and influenza A and B were negative -Initiated on IV ceftriaxone  and will continue and also add doxycycline . - Repeat chest x-ray showed improvement and amatory home O2 screen done and she did not desaturate.  She improved after 1 diuresis of Lasix  and will hold further diuresis given her HCM.  She was transitioned to oral steroids and placed on empiric antibiotics and will need to follow-up in outpatient setting within 1 to 2 weeks. - Patient was transitioned to oral doxycycline  for discharge.  Will cancel the 20 respiratory viral panel given that she is improved   HCM: Checking echocardiogram and the last echo showed no regional wall motion abnormalities LVEF of 70-75% and severe LVH and grade 1 diastolic dysfunction.  Repeat echocardiogram done and  showed an EF of 70 to 75% as well as Changes from prior  study are noted. EF and pericardial effusion stable from prior. There is  evidence of obstruction with higher gradient at rest and with valsalva.  -Patient to follow-up with cardiology outpatient setting g  HLD: C/w Rosuvastatin  20 mg po Daily   History of SVT/VT: Continue diltiazem  and monitor on telemetry.  Continue with carvedilol  12.5 mg twice daily  Metabolic Acidosis: Mild.  Patient CO2 is 21, anion gap is  15, chloride level is 103.  Continue monitoring trend and repeat CMP within 1 week   PAD: Sees [facility] in outpatient setting.  No longer taking Pletal  and states her claudication has improved.   History of alcoholic liver cirrhosis: Does not appear decompensated; no longer drinks. follow-up with Gastroenterology in outpatient setting   Diabetes Mellitus Type 2: Insulin  sensitive NovoLog  sliding scale insulin  AC and at bedtime and hold home regimen.  Last time she came to the hospital she presented to the ED with mild DKA and was stabilized in the ED and transition to metformin  and discharged home. CBG trend this admission:  Recent Labs  Lab 02/18/24 2147 02/19/24 0740 02/19/24 1208  GLUCAP 342* 234* 239*    GERD/GI Prophylaxis/history of gastric ulcer: C/w PPI w/ Pantoprazole  40 mg po at bedtime; avoid NSAIDs   Overweight: Complicates overall prognosis and care. Estimated body mass index is 26.57 kg/m as calculated from the following:   Height as of this encounter: 5' 2 (1.575 m).   Weight as of this encounter: 65.9 kg. Weight Loss and Dietary Counseling given

## 2024-02-23 LAB — CULTURE, BLOOD (ROUTINE X 2)
Culture: NO GROWTH
Culture: NO GROWTH
Special Requests: ADEQUATE
Special Requests: ADEQUATE
